# Patient Record
Sex: Female | Born: 1935 | Race: White | Hispanic: No | Smoking: Former smoker
Health system: Southern US, Community
[De-identification: ages and names within clinical notes are randomized; demographics above are authoritative.]

## PROBLEM LIST (undated history)

## (undated) DIAGNOSIS — Z96649 Presence of unspecified artificial hip joint: Secondary | ICD-10-CM

## (undated) DIAGNOSIS — R319 Hematuria, unspecified: Secondary | ICD-10-CM

## (undated) DIAGNOSIS — D649 Anemia, unspecified: Secondary | ICD-10-CM

## (undated) DIAGNOSIS — R739 Hyperglycemia, unspecified: Secondary | ICD-10-CM

## (undated) DIAGNOSIS — M1990 Unspecified osteoarthritis, unspecified site: Secondary | ICD-10-CM

## (undated) DIAGNOSIS — R413 Other amnesia: Secondary | ICD-10-CM

## (undated) DIAGNOSIS — M8580 Other specified disorders of bone density and structure, unspecified site: Secondary | ICD-10-CM

## (undated) DIAGNOSIS — C50919 Malignant neoplasm of unspecified site of unspecified female breast: Secondary | ICD-10-CM

## (undated) DIAGNOSIS — I350 Nonrheumatic aortic (valve) stenosis: Secondary | ICD-10-CM

## (undated) DIAGNOSIS — M545 Low back pain, unspecified: Secondary | ICD-10-CM

## (undated) DIAGNOSIS — G5600 Carpal tunnel syndrome, unspecified upper limb: Secondary | ICD-10-CM

## (undated) DIAGNOSIS — C189 Malignant neoplasm of colon, unspecified: Secondary | ICD-10-CM

## (undated) DIAGNOSIS — M797 Fibromyalgia: Secondary | ICD-10-CM

## (undated) DIAGNOSIS — G8929 Other chronic pain: Secondary | ICD-10-CM

## (undated) DIAGNOSIS — E785 Hyperlipidemia, unspecified: Secondary | ICD-10-CM

## (undated) DIAGNOSIS — I10 Essential (primary) hypertension: Secondary | ICD-10-CM

## (undated) HISTORY — DX: Low back pain: M54.5

## (undated) HISTORY — DX: Anemia, unspecified: D64.9

## (undated) HISTORY — DX: Carpal tunnel syndrome, unspecified upper limb: G56.00

## (undated) HISTORY — DX: Other chronic pain: G89.29

## (undated) HISTORY — DX: Hematuria, unspecified: R31.9

## (undated) HISTORY — DX: Malignant neoplasm of colon, unspecified: C18.9

## (undated) HISTORY — DX: Hyperlipidemia, unspecified: E78.5

## (undated) HISTORY — DX: Malignant neoplasm of unspecified site of unspecified female breast: C50.919

## (undated) HISTORY — DX: Other specified disorders of bone density and structure, unspecified site: M85.80

## (undated) HISTORY — DX: Low back pain, unspecified: M54.50

## (undated) HISTORY — DX: Hyperglycemia, unspecified: R73.9

## (undated) HISTORY — DX: Other amnesia: R41.3

## (undated) HISTORY — DX: Unspecified osteoarthritis, unspecified site: M19.90

## (undated) HISTORY — DX: Nonrheumatic aortic (valve) stenosis: I35.0

## (undated) HISTORY — DX: Presence of unspecified artificial hip joint: Z96.649

## (undated) HISTORY — DX: Fibromyalgia: M79.7

## (undated) HISTORY — DX: Essential (primary) hypertension: I10

---
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
## 1991-11-13 HISTORY — PX: OTHER SURGICAL HISTORY: SHX169

## 2000-11-12 HISTORY — PX: CHOLECYSTECTOMY: SHX55

## 2006-11-12 HISTORY — PX: BREAST LUMPECTOMY: SHX2

## 2013-12-08 LAB — LIPID PANEL
CHOLESTEROL: 183 (ref 0–200)
HDL: 56 (ref 35–70)
LDL Cholesterol: 92
TRIGLYCERIDES: 177 — AB (ref 40–160)

## 2013-12-08 LAB — CBC AND DIFFERENTIAL
HCT: 46 (ref 36–46)
Hemoglobin: 15.2 (ref 12.0–16.0)
PLATELETS: 153 (ref 150–399)
WBC: 6.7

## 2013-12-08 LAB — BASIC METABOLIC PANEL
BUN: 21 (ref 4–21)
CREATININE: 0.9 (ref 0.5–1.1)
Glucose: 135
POTASSIUM: 4.1 (ref 3.4–5.3)
Sodium: 142 (ref 137–147)

## 2013-12-08 LAB — HEPATIC FUNCTION PANEL: ALT: 16 (ref 7–35)

## 2014-09-16 LAB — HM MAMMOGRAPHY

## 2016-11-01 LAB — BASIC METABOLIC PANEL
BUN: 23 — AB (ref 4–21)
Creatinine: 0.8 (ref 0.5–1.1)
Glucose: 115
POTASSIUM: 3.7 (ref 3.4–5.3)
SODIUM: 143 (ref 137–147)

## 2016-11-01 LAB — CBC AND DIFFERENTIAL
HEMATOCRIT: 48 — AB (ref 36–46)
HEMOGLOBIN: 16.1 — AB (ref 12.0–16.0)
Neutrophils Absolute: 5
Platelets: 168 (ref 150–399)
WBC: 7.9

## 2017-07-12 LAB — CBC AND DIFFERENTIAL
HEMATOCRIT: 47 — AB (ref 36–46)
Hemoglobin: 16.2 — AB (ref 12.0–16.0)
NEUTROS ABS: 5
PLATELETS: 145 — AB (ref 150–399)
WBC: 7.2

## 2017-07-12 LAB — LIPID PANEL
Cholesterol: 185 (ref 0–200)
HDL: 53 (ref 35–70)
LDL Cholesterol: 93
Triglycerides: 182 — AB (ref 40–160)

## 2017-07-12 LAB — BASIC METABOLIC PANEL
BUN: 16 (ref 4–21)
Creatinine: 0.8 (ref 0.5–1.1)
GLUCOSE: 133
Potassium: 4 (ref 3.4–5.3)
Sodium: 146 (ref 137–147)

## 2017-07-12 LAB — HEMOGLOBIN A1C: Hemoglobin A1C: 6.5

## 2017-09-25 ENCOUNTER — Encounter: Payer: Self-pay | Admitting: Internal Medicine

## 2017-09-25 ENCOUNTER — Ambulatory Visit (INDEPENDENT_AMBULATORY_CARE_PROVIDER_SITE_OTHER): Payer: Medicare HMO | Admitting: Internal Medicine

## 2017-09-25 VITALS — BP 132/78 | HR 66 | Temp 98.0°F | Resp 12 | Ht <= 58 in | Wt 129.0 lb

## 2017-09-25 DIAGNOSIS — R32 Unspecified urinary incontinence: Secondary | ICD-10-CM | POA: Diagnosis not present

## 2017-09-25 DIAGNOSIS — M79662 Pain in left lower leg: Secondary | ICD-10-CM | POA: Diagnosis not present

## 2017-09-25 DIAGNOSIS — E119 Type 2 diabetes mellitus without complications: Secondary | ICD-10-CM

## 2017-09-25 DIAGNOSIS — I10 Essential (primary) hypertension: Secondary | ICD-10-CM | POA: Diagnosis not present

## 2017-09-25 DIAGNOSIS — M2550 Pain in unspecified joint: Secondary | ICD-10-CM | POA: Diagnosis not present

## 2017-09-25 DIAGNOSIS — I350 Nonrheumatic aortic (valve) stenosis: Secondary | ICD-10-CM

## 2017-09-25 DIAGNOSIS — E782 Mixed hyperlipidemia: Secondary | ICD-10-CM | POA: Diagnosis not present

## 2017-09-25 DIAGNOSIS — Z7189 Other specified counseling: Secondary | ICD-10-CM

## 2017-09-25 MED ORDER — HYDROCHLOROTHIAZIDE 25 MG PO TABS: 12.5000 mg | ORAL_TABLET | Freq: Every day | ORAL | 1 refills | Status: DC

## 2017-09-25 MED ORDER — DICLOFENAC SODIUM 1 % TD GEL: TRANSDERMAL | 1 refills | Status: DC

## 2017-09-25 MED ORDER — ISOSORBIDE MONONITRATE ER 30 MG PO TB24: 30.0000 mg | ORAL_TABLET | Freq: Every day | ORAL | 1 refills | Status: DC

## 2017-09-25 MED ORDER — HYDROCODONE-ACETAMINOPHEN 5-325 MG PO TABS: 1.0000 | ORAL_TABLET | Freq: Four times a day (QID) | ORAL | 0 refills | Status: DC | PRN

## 2017-09-25 MED ORDER — SIMVASTATIN 40 MG PO TABS: 40.0000 mg | ORAL_TABLET | Freq: Every day | ORAL | 1 refills | Status: DC

## 2017-09-25 NOTE — Patient Instructions (Signed)
START NORCO (hydrocodone) as needed for pain but at least 2 times daily. May cut tablet in half  [facility] 25mg  daily for overactive bladder - samples given  STOP VESICARE  Continue other medications as ordered  Will call with Ortho referral and PT appt  Will call with [place] appt for left leg  Fasting labs in 1-2 weeks  Follow up in 3 mos for AWV/CPE.

## 2017-09-25 NOTE — Progress Notes (Signed)
Patient ID: Julie Walter, female   DOB: 09-Aug-1919, 83 y.o.   MRN: [id number]    Location:  PAM Place of Service: OFFICE    Advanced Directive information Does Patient Have a Medical Advance Directive?: Yes, Type of Advance Directive: Healthcare Power of [place];Living will  Chief Complaint  Patient presents with  . Establish Care    New Patient establish care, sore area on left leg since Saturday (no known injury). Patient also c/o bone pain. Patient is relocating from [place], here with daughter Julie Walter  . Medication Refill    All medications will be due for refills in December   . Medication Management    Vesicare is not helping, patient would like to d/c. When patient takes Vesicare daily it interfered with urination(stopped urination all together)  and she changed to every 5 days   . Medication Management    Tramadol not helping with pain     HPI:  83 yo female seen today as a new pt. She relocated from [facility] in September 2018. She lives alone in [place]. Her daughter lives close by. She is c/a sore spot on LLE x 5 days. No f/c, no d/c. She reports hx DVT in 2001 after prolonged driving that was tx.  DM - diet controlled. A1c 6.5%  Hyperlipidemia - stable on statin. LDL 93  Hemoconcentration - stable. H/H 16.2/47. She was told it was due to dehydration  Severe AS - 2D echo EF 60-65% with critical AS. Surgery recommended in Spring 2018 but she declined due to c/a severe joint pain.  Urinary incontinance - she has leakage , urge related . She would like to stop vesicare as it is ineffective and caused inability to urinate and dry mouth. She has never tried [employer].  Chronic left sciatic pain - she tried 6 tx of accupuncture without relief this yr. It resolved after 1 month last year. She has not tried PT (she has b/l THR and had PT then). She reports hx scoliosis.   Chronic joint pain - no relief with tramadol. Pain worse in shoulders and sciatica. She was followed  by Ortho in [facility]. She has tried hydrocodone in the past and found it to be more effective than oxycodone.    Past Medical History:  Diagnosis Date  . Anemia   . Aortic stenosis   . Arthritis   . Breast cancer ([place])   . Carpal tunnel syndrome   . Chronic pain   . Colon cancer ([place])   . Fibromyalgia   . Hematuria   . History of hip replacement    bilateral  . Hyperglycemia   . Hyperlipidemia   . Hypertension   . Lumbago   . Memory loss   . Osteoarthritis   . Osteopenia     Past Surgical History:  Procedure Laterality Date  . ABDOMINAL HYSTERECTOMY  1958  . BREAST LUMPECTOMY  2008  . CHOLECYSTECTOMY  2002  . colon cancer tumor  1993   Dr. Jens Som  . hip replacements     2013 and 2014    Patient Care Team: Gildardo Cranker, DO as PCP - General (Internal Medicine)  Social History   Socioeconomic History  . Marital status: Widowed    Spouse name: Not on file  . Number of children: Not on file  . Years of education: Not on file  . Highest education level: Not on file  Social Needs  . Financial resource strain: Not on file  . Food insecurity - worry:  Not on file  . Food insecurity - inability: Not on file  . Transportation needs - medical: Not on file  . Transportation needs - non-medical: Not on file  Occupational History  . Not on file  Tobacco Use  . Smoking status: Former Smoker    Packs/day: 2.00    Years: 20.00    Pack years: 40.00  . Smokeless tobacco: Never Used  Substance and Sexual Activity  . Alcohol use: No  . Drug use: No  . Sexual activity: Not Currently  Other Topics Concern  . Not on file  Social History Narrative   Social History      Diet? light      Do you drink/eat things with caffeine? Coffee 1/day      Marital status?             widowed                       What year were you married? 1944      Do you live in a house, apartment, assisted living, condo, trailer, etc.? Independent living      Is it one or more stories? 3 stories        How many persons live in your home? 1      Do you have any pets in your home? (please list)  none      Highest level of education completed? High school + secretarial studies post      Current or past profession: Herbalist, Arts administrator      Do you exercise?                 yes                     Type & how often? Walking, back exercises      Advanced Directives      Do you have a living will? yes      Do you have a DNR form?       no                           If not, do you want to discuss one? yes      Do you have signed POA/HPOA for forms? yes      Functional Status      Do you have difficulty bathing or dressing yourself? no      Do you have difficulty preparing food or eating? no      Do you have difficulty managing your medications? no      Do you have difficulty managing your finances? no      Do you have difficulty affording your medications? no     reports that she has quit smoking. She has a 40.00 pack-year smoking history. she has never used smokeless tobacco. She reports that she does not drink alcohol or use drugs.  Family History  Problem Relation Age of Onset  . Cancer Mother   . Heart disease Mother   . Cancer Sister   . Cancer Brother   . Cancer Sister   . Heart failure Brother   . Cancer Sister   . Heart failure Brother   . Hemachromatosis Son   . Diabetes Son    Family Status  Relation Name Status  . Mother Audrea Muscat Deceased  . Father Deidre Ala Deceased  . Sister Barnett Applebaum Deceased  .  Brother Gully Deceased  . Sister Rod Holler Deceased  . Brother [employer] Deceased  . Sister Berenice Primas Deceased  . Brother Rica Mote Deceased  . Son  Lions  . Daughter Benna Dunks    Immunization History  Administered Date(s) Administered  . Influenza,inj,Quad PF,6+ Mos 08/30/2017  . Influenza-Unspecified 08/21/2016, 07/12/2017    No Known Allergies  Medications:   Medication List        Accurate as of 09/25/17 12:01 PM. Always use your  most recent med list.          diclofenac sodium 1 % Gel Commonly known as:  VOLTAREN   hydrochlorothiazide 25 MG tablet Commonly known as:  HYDRODIURIL   isosorbide mononitrate 30 MG 24 hr tablet Commonly known as:  IMDUR   multivitamin with minerals Tabs tablet   naproxen sodium 220 MG tablet Commonly known as:  ALEVE   polyethylene glycol packet Commonly known as:  MIRALAX / GLYCOLAX   PROBIOTIC ACIDOPHILUS PO   simvastatin 40 MG tablet Commonly known as:  ZOCOR   solifenacin 5 MG tablet Commonly known as:  VESICARE   traMADol 50 MG tablet Commonly known as:  ULTRAM       Review of Systems  Constitutional: Positive for fatigue.  HENT: Positive for hearing loss (wears hearing aids).   Respiratory: Positive for shortness of breath (with stair climbing).   Cardiovascular: Positive for leg swelling. Negative for chest pain and palpitations.  Genitourinary: Positive for difficulty urinating.  Musculoskeletal: Positive for arthralgias, back pain, gait problem and joint swelling.  All other systems reviewed and are negative.   Vitals:   09/25/17 1128  BP: 132/78  Pulse: 66  Resp: 12  Temp: 98 F (36.7 C)  TempSrc: Oral  SpO2: 94%  Weight: 129 lb (58.5 kg)  Height: 4' 9.5" (1.461 m)   Body mass index is 27.43 kg/m.  Physical Exam  Constitutional: She is oriented to person, place, and time. She appears well-developed and well-nourished.  HOH  HENT:  Mouth/Throat: Oropharynx is clear and moist. No oropharyngeal exudate.  MMM; no oral thrush  Eyes: Pupils are equal, round, and reactive to light. No scleral icterus.  Neck: Neck supple. Carotid bruit is present (b/l systolic). No tracheal deviation present. No thyromegaly present.  Cardiovascular: Normal rate, regular rhythm and intact distal pulses. Exam reveals no gallop and no friction rub.  Murmur (2/6 SEM --> carotid b/l) heard. (+) left calf palpable cord, TTP with swelling. No LE edema b/l. No right  calf TTP. B/l LE varicose veins, soft  Pulmonary/Chest: Effort normal and breath sounds normal. No stridor. No respiratory distress. She has no wheezes. She has no rales.  Abdominal: Soft. Normal appearance and bowel sounds are normal. She exhibits no distension and no mass. There is no hepatomegaly. There is no tenderness. There is no rigidity, no rebound and no guarding. No hernia.  Musculoskeletal: She exhibits edema and tenderness.  L>R shoulder with reduced ROM and TTP anterior left shoulder and crepitus; AC joint TTP L>R with swelling; L>R coracoid process TTP  Lymphadenopathy:    She has no cervical adenopathy.  Neurological: She is alert and oriented to person, place, and time. She has normal reflexes.  Skin: Skin is warm and dry. No rash noted.  Psychiatric: She has a normal mood and affect. Her behavior is normal. Judgment and thought content normal.     Labs reviewed: Abstract on 09/17/2017  Component Date Value Ref Range Status  . Hemoglobin 12/08/2013 15.2  12.0 -  16.0 Final  . HCT 12/08/2013 46  36 - 46 Final  . Platelets 12/08/2013 153  150 - 399 Final  . WBC 12/08/2013 6.7   Final  . Glucose 12/08/2013 135   Final  . BUN 12/08/2013 21  4 - 21 Final  . Creatinine 12/08/2013 0.9  0.5 - 1.1 Final  . Potassium 12/08/2013 4.1  3.4 - 5.3 Final  . Sodium 12/08/2013 142  137 - 147 Final  . Triglycerides 12/08/2013 177* 40 - 160 Final  . Cholesterol 12/08/2013 183  0 - 200 Final  . HDL 12/08/2013 56  35 - 70 Final  . LDL Cholesterol 12/08/2013 92   Final  . ALT 12/08/2013 16  7 - 35 Final  . Hemoglobin 07/12/2017 16.2* 12.0 - 16.0 Final  . HCT 07/12/2017 47* 36 - 46 Final  . Neutrophils Absolute 07/12/2017 5   Final  . Platelets 07/12/2017 145* 150 - 399 Final  . WBC 07/12/2017 7.2   Final  . Glucose 07/12/2017 133   Final  . BUN 07/12/2017 16  4 - 21 Final  . Creatinine 07/12/2017 0.8  0.5 - 1.1 Final  . Potassium 07/12/2017 4.0  3.4 - 5.3 Final  . Sodium 07/12/2017 146   137 - 147 Final  . Triglycerides 07/12/2017 182* 40 - 160 Final  . Cholesterol 07/12/2017 185  0 - 200 Final  . HDL 07/12/2017 53  35 - 70 Final  . LDL Cholesterol 07/12/2017 93   Final  . Hemoglobin A1C 07/12/2017 6.5   Final  . Hemoglobin 11/01/2016 16.1* 12.0 - 16.0 Final  . HCT 11/01/2016 48* 36 - 46 Final  . Neutrophils Absolute 11/01/2016 5   Final  . Platelets 11/01/2016 168  150 - 399 Final  . WBC 11/01/2016 7.9   Final  . Glucose 11/01/2016 115   Final  . BUN 11/01/2016 23* 4 - 21 Final  . Creatinine 11/01/2016 0.8  0.5 - 1.1 Final  . Potassium 11/01/2016 3.7  3.4 - 5.3 Final  . Sodium 11/01/2016 143  137 - 147 Final  . HM Mammogram 09/16/2014 0-4 Bi-Rad  0-4 Bi-Rad, Self Reported Normal Final   [facility] radiology    No results found.   Assessment/Plan   ICD-10-CM   1. Pain of left calf M79.662 VAS [place] LOWER EXTREMITY VENOUS (DVT)  2. Multiple joint pain M25.50 diclofenac sodium (VOLTAREN) 1 % GEL    Ambulatory referral to Physical Therapy    Ambulatory referral to Orthopedic Surgery    HYDROcodone-acetaminophen (NORCO) 5-325 MG tablet  3. Essential hypertension I10 hydrochlorothiazide (HYDRODIURIL) 25 MG tablet  4. Type 2 diabetes mellitus without complication, without long-term current use of insulin (HCC) E11.9   5. Aortic stenosis, severe I35.0 isosorbide mononitrate (IMDUR) 30 MG 24 hr tablet   critical  6. Urinary incontinence, unspecified type R32   7. Mixed hyperlipidemia E78.2 simvastatin (ZOCOR) 40 MG tablet  8. Advanced directives, counseling/discussion Z71.89    START NORCO (hydrocodone) as needed for pain but at least 2 times daily. May cut tablet in half - if she tolerates, she will need pain contract  MYRBETRIQ 25mg  daily for overactive bladder - samples given  STOP VESICARE  Continue other medications as ordered  Will call with Ortho referral and PT appt  Will call with [place] appt for left leg  Fasting labs in 1-2 weeks  Follow up in 3  mos for AWV/CPE.   Devora Tortorella S. Eulas Post, D. O., F. [place]  Meda Coffee  [facility] and Adult Medicine [place], [place] [address] [phone] Cell (Monday-Friday 8 AM - 5 PM) [phone] After 5 PM and follow prompts

## 2017-09-26 ENCOUNTER — Other Ambulatory Visit: Payer: Self-pay | Admitting: Internal Medicine

## 2017-09-26 DIAGNOSIS — I10 Essential (primary) hypertension: Secondary | ICD-10-CM

## 2017-09-26 DIAGNOSIS — E782 Mixed hyperlipidemia: Secondary | ICD-10-CM

## 2017-09-26 DIAGNOSIS — E119 Type 2 diabetes mellitus without complications: Secondary | ICD-10-CM

## 2017-09-26 DIAGNOSIS — Z79899 Other long term (current) drug therapy: Secondary | ICD-10-CM

## 2017-10-01 ENCOUNTER — Telehealth: Payer: Self-pay

## 2017-10-01 ENCOUNTER — Ambulatory Visit (HOSPITAL_COMMUNITY)
Admission: RE | Admit: 2017-10-01 | Discharge: 2017-10-01 | Disposition: A | Discharge status: Home / Self Care | Payer: Medicare HMO | Source: Ambulatory Visit | Attending: Vascular Surgery | Admitting: Vascular Surgery

## 2017-10-01 DIAGNOSIS — M79662 Pain in left lower leg: Secondary | ICD-10-CM

## 2017-10-01 NOTE — Telephone Encounter (Signed)
A call came in from [facility] stating that patient is negative for DVT of left leg.

## 2017-10-07 ENCOUNTER — Telehealth: Payer: Self-pay | Admitting: *Deleted

## 2017-10-07 NOTE — Telephone Encounter (Signed)
LMOM to return call.

## 2017-10-07 NOTE — Telephone Encounter (Signed)
Ok to [employer] med - how often is pt taking med? Is it controlling her pain?

## 2017-10-07 NOTE — Telephone Encounter (Signed)
Patient daughter, Anne Ng called and stated that patient needs a refill on her Pain Medication, Hydrocodone, tolerated.  Verified [facility] [place] and last RF was 09/25/2017 for #30.  Is it ok for a refill, please advise.

## 2017-10-08 ENCOUNTER — Encounter: Payer: Self-pay | Admitting: Internal Medicine

## 2017-10-08 ENCOUNTER — Ambulatory Visit (INDEPENDENT_AMBULATORY_CARE_PROVIDER_SITE_OTHER): Payer: Medicare HMO | Admitting: Internal Medicine

## 2017-10-08 ENCOUNTER — Other Ambulatory Visit: Payer: Medicare HMO

## 2017-10-08 VITALS — BP 138/80 | HR 69 | Temp 97.4°F | Ht <= 58 in | Wt 130.0 lb

## 2017-10-08 DIAGNOSIS — M4726 Other spondylosis with radiculopathy, lumbar region: Secondary | ICD-10-CM | POA: Diagnosis not present

## 2017-10-08 DIAGNOSIS — E782 Mixed hyperlipidemia: Secondary | ICD-10-CM | POA: Diagnosis not present

## 2017-10-08 DIAGNOSIS — M25473 Effusion, unspecified ankle: Secondary | ICD-10-CM

## 2017-10-08 DIAGNOSIS — Z79899 Other long term (current) drug therapy: Secondary | ICD-10-CM

## 2017-10-08 DIAGNOSIS — E119 Type 2 diabetes mellitus without complications: Secondary | ICD-10-CM

## 2017-10-08 MED ORDER — OXYCODONE HCL 5 MG PO TABS: 5.0000 mg | ORAL_TABLET | ORAL | 0 refills | Status: DC | PRN

## 2017-10-08 NOTE — Progress Notes (Signed)
Patient ID: Julie Walter, female   DOB: 15-Jan-1919, 83 y.o.   MRN: [id number]    Location:  PAM Place of Service: OFFICE  Chief Complaint  Patient presents with  . Acute Visit    Ankle swelling, here with Daughter   . Health Maintenance    Foot Exam Due, MALB, DM eye exam and Dexa overdue   . Medication Management    Hydrocodone is not helping, ? if patient should start PT tomorrow   . Medication Refill    No refills needed     HPI:  83 yo female seen today for ankle swelling and joint pain. She has severe pain in back of her legs and when she sits down. Sleep interrupted due to pain. Tried Norco but ineffective, despite escalating frequency up to every 6 hrs. She uses voltaren gel 2-3 times per day. She noticed swelling in her ankles since beginning Norco. She has never had MRI. Xray L spine in July revealed scoliosis and severe degenerative changes. No loss of bowel/bladder control. She has not tried Myrbetriq yet.  Her daughter is present  Past Medical History:  Diagnosis Date  . Anemia   . Aortic stenosis   . Arthritis   . Breast cancer ([place])   . Carpal tunnel syndrome   . Chronic pain   . Colon cancer ([place])   . Fibromyalgia   . Hematuria   . History of hip replacement    bilateral  . Hyperglycemia   . Hyperlipidemia   . Hypertension   . Lumbago   . Memory loss   . Osteoarthritis   . Osteopenia     Past Surgical History:  Procedure Laterality Date  . ABDOMINAL HYSTERECTOMY  1958  . BREAST LUMPECTOMY  2008  . CHOLECYSTECTOMY  2002  . colon cancer tumor  1993   Dr. Jens Som  . hip replacements     2013 and 2014    Patient Care Team: Gildardo Cranker, DO as PCP - General (Internal Medicine)  Social History   Socioeconomic History  . Marital status: Widowed    Spouse name: Not on file  . Number of children: Not on file  . Years of education: Not on file  . Highest education level: Not on file  Social Needs  . Financial resource strain: Not on file    . Food insecurity - worry: Not on file  . Food insecurity - inability: Not on file  . Transportation needs - medical: Not on file  . Transportation needs - non-medical: Not on file  Occupational History  . Not on file  Tobacco Use  . Smoking status: Former Smoker    Packs/day: 2.00    Years: 20.00    Pack years: 40.00  . Smokeless tobacco: Never Used  Substance and Sexual Activity  . Alcohol use: No  . Drug use: No  . Sexual activity: Not Currently  Other Topics Concern  . Not on file  Social History Narrative   Social History      Diet? light      Do you drink/eat things with caffeine? Coffee 1/day      Marital status?             widowed                       What year were you married? 1944      Do you live in a house, apartment, assisted living, condo, trailer, etc.? Independent living  Is it one or more stories? 3 stories      How many persons live in your home? 1      Do you have any pets in your home? (please list)  none      Highest level of education completed? High school + secretarial studies post      Current or past profession: Herbalist, Arts administrator      Do you exercise?                 yes                     Type & how often? Walking, back exercises      Advanced Directives      Do you have a living will? yes      Do you have a DNR form?       no                           If not, do you want to discuss one? yes      Do you have signed POA/HPOA for forms? yes      Functional Status      Do you have difficulty bathing or dressing yourself? no      Do you have difficulty preparing food or eating? no      Do you have difficulty managing your medications? no      Do you have difficulty managing your finances? no      Do you have difficulty affording your medications? no     reports that she has quit smoking. She has a 40.00 pack-year smoking history. she has never used smokeless tobacco. She reports that she does not drink  alcohol or use drugs.  Family History  Problem Relation Age of Onset  . Cancer Mother   . Heart disease Mother   . Cancer Sister   . Cancer Brother   . Cancer Sister   . Heart failure Brother   . Cancer Sister   . Heart failure Brother   . Hemachromatosis Son   . Diabetes Son    Family Status  Relation Name Status  . Mother Audrea Muscat Deceased  . Father Deidre Ala Deceased  . Sister Barnett Applebaum Deceased  . Brother Gregory Deceased  . Sister Rod Holler Deceased  . Brother [employer] Deceased  . Sister Berenice Primas Deceased  . Brother Rica Mote Deceased  . Son Cressona Lions  . Daughter Benna Dunks     No Known Allergies  Medications:   Medication List        Accurate as of 10/08/17  1:58 PM. Always use your most recent med list.          diclofenac sodium 1 % Gel Commonly known as:  VOLTAREN Apply topically 2 (two) times daily.   hydrochlorothiazide 25 MG tablet Commonly known as:  HYDRODIURIL Take 0.5 tablets (12.5 mg total) daily by mouth.   HYDROcodone-acetaminophen 5-325 MG tablet Commonly known as:  NORCO Take 1 tablet every 6 (six) hours as needed by mouth for moderate pain.   isosorbide mononitrate 30 MG 24 hr tablet Commonly known as:  IMDUR Take 1 tablet (30 mg total) daily by mouth.   multivitamin with minerals Tabs tablet   polyethylene glycol packet Commonly known as:  MIRALAX / GLYCOLAX   PROBIOTIC ACIDOPHILUS PO   simvastatin 40 MG tablet Commonly known as:  ZOCOR Take 1 tablet (40  mg total) daily by mouth.       Review of Systems  Genitourinary: Positive for difficulty urinating and frequency.  Musculoskeletal: Positive for arthralgias, back pain and gait problem.  All other systems reviewed and are negative.   Vitals:   10/08/17 1341  BP: 138/80  Pulse: 69  Temp: (!) 97.4 F (36.3 C)  TempSrc: Oral  SpO2: 93%  Weight: 130 lb (59 kg)  Height: 4' 9.5" (1.461 m)   Body mass index is 27.64 kg/m.  Physical Exam  Constitutional: She is oriented to  person, place, and time. She appears well-developed and well-nourished.  Musculoskeletal: She exhibits edema, tenderness and deformity (scoliosis).       Lumbar back: She exhibits decreased range of motion, tenderness and spasm.       Back:  (+) left standing flexion test  Neurological: She is alert and oriented to person, place, and time. Gait (antalgic) abnormal.  Skin: Skin is warm and dry. No rash noted.  Psychiatric: She has a normal mood and affect. Her behavior is normal. Judgment and thought content normal.     Labs reviewed: Abstract on 09/17/2017  Component Date Value Ref Range Status  . Hemoglobin 12/08/2013 15.2  12.0 - 16.0 Final  . HCT 12/08/2013 46  36 - 46 Final  . Platelets 12/08/2013 153  150 - 399 Final  . WBC 12/08/2013 6.7   Final  . Glucose 12/08/2013 135   Final  . BUN 12/08/2013 21  4 - 21 Final  . Creatinine 12/08/2013 0.9  0.5 - 1.1 Final  . Potassium 12/08/2013 4.1  3.4 - 5.3 Final  . Sodium 12/08/2013 142  137 - 147 Final  . Triglycerides 12/08/2013 177* 40 - 160 Final  . Cholesterol 12/08/2013 183  0 - 200 Final  . HDL 12/08/2013 56  35 - 70 Final  . LDL Cholesterol 12/08/2013 92   Final  . ALT 12/08/2013 16  7 - 35 Final  . Hemoglobin 07/12/2017 16.2* 12.0 - 16.0 Final  . HCT 07/12/2017 47* 36 - 46 Final  . Neutrophils Absolute 07/12/2017 5   Final  . Platelets 07/12/2017 145* 150 - 399 Final  . WBC 07/12/2017 7.2   Final  . Glucose 07/12/2017 133   Final  . BUN 07/12/2017 16  4 - 21 Final  . Creatinine 07/12/2017 0.8  0.5 - 1.1 Final  . Potassium 07/12/2017 4.0  3.4 - 5.3 Final  . Sodium 07/12/2017 146  137 - 147 Final  . Triglycerides 07/12/2017 182* 40 - 160 Final  . Cholesterol 07/12/2017 185  0 - 200 Final  . HDL 07/12/2017 53  35 - 70 Final  . LDL Cholesterol 07/12/2017 93   Final  . Hemoglobin A1C 07/12/2017 6.5   Final  . Hemoglobin 11/01/2016 16.1* 12.0 - 16.0 Final  . HCT 11/01/2016 48* 36 - 46 Final  . Neutrophils Absolute  11/01/2016 5   Final  . Platelets 11/01/2016 168  150 - 399 Final  . WBC 11/01/2016 7.9   Final  . Glucose 11/01/2016 115   Final  . BUN 11/01/2016 23* 4 - 21 Final  . Creatinine 11/01/2016 0.8  0.5 - 1.1 Final  . Potassium 11/01/2016 3.7  3.4 - 5.3 Final  . Sodium 11/01/2016 143  137 - 147 Final  . HM Mammogram 09/16/2014 0-4 Bi-Rad  0-4 Bi-Rad, Self Reported Normal Final   [facility] radiology    No results found.   Assessment/Plan   ICD-10-CM  1. Other spondylosis with radiculopathy, lumbar region - pain failing to change as expected M47.26 oxyCODONE (OXY IR/ROXICODONE) 5 MG immediate release tablet    MR Lumbar Spine Wo Contrast  2. Ankle swelling, unspecified laterality M25.473     May need Ortho vs neurosx eval for probable epidural injection  Start PT as scheduled in the AM  START OXYIR 5MG  TAKE 1 TAB EVERY 4 HRS AS NEEDED FOR MODERATE-SEVERE PAIN  - side effects discussed  STOP HYDROCODONE  Continue other medications as ordered  Will call with MRI appt  Follow up as scheduled  Stacy Deshler S. Perlie Gold  [facility] and Adult Medicine [address] [place], [place] [address] [phone] Cell (Monday-Friday 8 AM - 5 PM) [phone] After 5 PM and follow prompts

## 2017-10-08 NOTE — Telephone Encounter (Signed)
Daughter stated that patient states that sometimes the Hydrocodone controls her pain and sometimes not. Also stated that patient is having some swelling in extremities. Daughter has scheduled an appointment with Dr. Eulas Post for this afternoon to have this looked at and will wait on Rx until then, just in case Dr. Eulas Post changes Rx. Agreed.

## 2017-10-08 NOTE — Patient Instructions (Addendum)
Start PT as scheduled  START OXYIR 5MG  TAKE 1 TAB EVERY 4 HRS AS NEEDED FOR MODERATE-SEVERE PAIN  STOP HYDROCODONE  Continue other medications as ordered  Will call with MRI appt  Follow up as scheduled

## 2017-10-09 ENCOUNTER — Ambulatory Visit: Payer: Medicare HMO | Attending: Internal Medicine | Admitting: Physical Therapy

## 2017-10-09 ENCOUNTER — Encounter: Payer: Self-pay | Admitting: Physical Therapy

## 2017-10-09 DIAGNOSIS — M545 Low back pain: Secondary | ICD-10-CM | POA: Diagnosis not present

## 2017-10-09 DIAGNOSIS — M25552 Pain in left hip: Secondary | ICD-10-CM | POA: Diagnosis present

## 2017-10-09 DIAGNOSIS — R2689 Other abnormalities of gait and mobility: Secondary | ICD-10-CM | POA: Diagnosis present

## 2017-10-09 DIAGNOSIS — M25551 Pain in right hip: Secondary | ICD-10-CM

## 2017-10-09 DIAGNOSIS — G8929 Other chronic pain: Secondary | ICD-10-CM | POA: Diagnosis present

## 2017-10-09 DIAGNOSIS — M62830 Muscle spasm of back: Secondary | ICD-10-CM | POA: Diagnosis present

## 2017-10-09 LAB — URINALYSIS, ROUTINE W REFLEX MICROSCOPIC
Bilirubin Urine: NEGATIVE
Glucose, UA: NEGATIVE
HYALINE CAST: NONE SEEN /LPF
Hgb urine dipstick: NEGATIVE
KETONES UR: NEGATIVE
Nitrite: NEGATIVE
PH: 6.5 (ref 5.0–8.0)
Protein, ur: NEGATIVE
RBC / HPF: NONE SEEN /HPF (ref 0–2)
SPECIFIC GRAVITY, URINE: 1.018 (ref 1.001–1.03)

## 2017-10-09 LAB — COMPLETE METABOLIC PANEL WITH GFR
AG Ratio: 1.9 (calc) (ref 1.0–2.5)
ALBUMIN MSPROF: 4.2 g/dL (ref 3.6–5.1)
ALT: 9 U/L (ref 6–29)
AST: 18 U/L (ref 10–35)
Alkaline phosphatase (APISO): 50 U/L (ref 33–130)
BUN/Creatinine Ratio: 21 (calc) (ref 6–22)
BUN: 20 mg/dL (ref 7–25)
CHLORIDE: 101 mmol/L (ref 98–110)
CO2: 32 mmol/L (ref 20–32)
Calcium: 9.8 mg/dL (ref 8.6–10.4)
Creat: 0.94 mg/dL — ABNORMAL HIGH (ref 0.60–0.88)
GFR, Est African American: 59 mL/min/{1.73_m2} — ABNORMAL LOW (ref >=60)
GFR, Est Non African American: 51 mL/min/{1.73_m2} — ABNORMAL LOW (ref >=60)
GLUCOSE: 128 mg/dL — AB (ref 65–99)
Globulin: 2.2 g/dL (calc) (ref 1.9–3.7)
POTASSIUM: 3.9 mmol/L (ref 3.5–5.3)
Sodium: 142 mmol/L (ref 135–146)
TOTAL PROTEIN: 6.4 g/dL (ref 6.1–8.1)
Total Bilirubin: 1 mg/dL (ref 0.2–1.2)

## 2017-10-09 LAB — CBC WITH DIFFERENTIAL/PLATELET
Basophils Absolute: 20 cells/uL (ref 0–200)
Basophils Relative: 0.3 %
EOS ABS: 91 {cells}/uL (ref 15–500)
Eosinophils Relative: 1.4 %
HCT: 44.4 % (ref 35.0–45.0)
Hemoglobin: 15 g/dL (ref 11.7–15.5)
Lymphs Abs: 1378 cells/uL (ref 850–3900)
MCH: 31.6 pg (ref 27.0–33.0)
MCHC: 33.8 g/dL (ref 32.0–36.0)
MCV: 93.7 fL (ref 80.0–100.0)
MONOS PCT: 10.9 %
MPV: 11.6 fL (ref 7.5–12.5)
NEUTROS PCT: 66.2 %
Neutro Abs: 4303 cells/uL (ref 1500–7800)
PLATELETS: 135 10*3/uL — AB (ref 140–400)
RBC: 4.74 10*6/uL (ref 3.80–5.10)
RDW: 11.8 % (ref 11.0–15.0)
Total Lymphocyte: 21.2 %
WBC: 6.5 10*3/uL (ref 3.8–10.8)
WBCMIX: 709 {cells}/uL (ref 200–950)

## 2017-10-09 LAB — HEMOGLOBIN A1C
EAG (MMOL/L): 7.3 (calc)
HEMOGLOBIN A1C: 6.2 %{Hb} — AB (ref <5.7)
MEAN PLASMA GLUCOSE: 131 (calc)

## 2017-10-09 LAB — LIPID PANEL
CHOLESTEROL: 163 mg/dL (ref <200)
HDL: 55 mg/dL (ref >50)
LDL Cholesterol (Calc): 84 mg/dL (calc)
Non-HDL Cholesterol (Calc): 108 mg/dL (calc) (ref <130)
Total CHOL/HDL Ratio: 3 (calc) (ref <5.0)
Triglycerides: 141 mg/dL (ref <150)

## 2017-10-09 LAB — MICROALBUMIN / CREATININE URINE RATIO
CREATININE, URINE: 92 mg/dL (ref 20–275)
MICROALB UR: 1.1 mg/dL
MICROALB/CREAT RATIO: 12 ug/mg{creat} (ref <30)

## 2017-10-09 NOTE — Therapy (Signed)
[place], [place], [address] Phone: [phone]   Fax:  [phone]  Physical Therapy Evaluation  Patient Details  Name: Julie Walter MRN: [id number] Date of Birth: Jul 27, 1919 Referring Provider: Gildardo Cranker, DO   Encounter Date: 10/09/2017  PT End of Session - 10/09/17 1629    Visit Number  1    Number of Visits  12    Date for PT Re-Evaluation  10/30/17    Authorization Type  MCR: Kx mod by 15th visit, progress note by 10th visit.     PT Start Time  1415    PT Stop Time  1505    PT Time Calculation (min)  50 min    Activity Tolerance  Patient tolerated treatment well    Behavior During Therapy  WFL for tasks assessed/performed       Past Medical History:  Diagnosis Date  . Anemia   . Aortic stenosis   . Arthritis   . Breast cancer ([place])   . Carpal tunnel syndrome   . Chronic pain   . Colon cancer ([place])   . Fibromyalgia   . Hematuria   . History of hip replacement    bilateral  . Hyperglycemia   . Hyperlipidemia   . Hypertension   . Lumbago   . Memory loss   . Osteoarthritis   . Osteopenia     Past Surgical History:  Procedure Laterality Date  . ABDOMINAL HYSTERECTOMY  1958  . BREAST LUMPECTOMY  2008  . CHOLECYSTECTOMY  2002  . colon cancer tumor  1993   Dr. Jens Som  . hip replacements     2013 and 2014    There were no vitals filed for this visit.   Subjective Assessment - 10/09/17 1424    Subjective   Pt. reports pain from sciatica that began in June and has progressively gotten worse. Pt. reports that pain may have been caused by prolonged sitting from extended car ride and that pain radiated down the entire LE bilaterally. She notes that the pain began on the left but is now worse on the right. Pt. recalls having previous episodes of similar pain that normally resolve, however states not being able to find relief from current episode. Pt. states she is an avid walker  and has not been able to walk as much since onset of pain. Pt. notes tingling however does not display any red flag symptoms.     Pertinent History  bilateral hip replacements, lt. 2013, rt. 2014     How long can you sit comfortably?  pt. could not specify, states she does not like to sit for long periods of time     How long can you stand comfortably?  10 minutes     How long can you walk comfortably?  10 minutes     Diagnostic tests  upcoming MRI    Patient Stated Goals  walking better, pain relief     Currently in Pain?  No/denies    Pain Score  -- at worst 10/10    Pain Location  Back    Pain Descriptors / Indicators  Spasm    Pain Type  Chronic pain    Pain Radiating Towards  entire LE, bilaterally     Pain Onset  More than a month ago    Pain Frequency  Intermittent    Aggravating Factors   laying, prolonged sitting, prolonged standing     Pain Relieving Factors  medication,  rest, heat, ice        [facility] PT Assessment - 10/09/17 1434      Assessment   Medical Diagnosis  Multiple joint pain     Referring Provider  Gildardo Cranker, DO    Onset Date/Surgical Date  -- June 2018    Hand Dominance  Right    Next MD Visit  12/2017 saw physcian yesterday    Prior Therapy  Yes  for hip/ back       Precautions   Precautions  Other (comment) pt. is hard of hearing       Restrictions   Weight Bearing Restrictions  No      Balance Screen   Has the patient fallen in the past 6 months  Yes    How many times?  1    Has the patient had a decrease in activity level because of a fear of falling?   No    Is the patient reluctant to leave their home because of a fear of falling?   No      Home Social worker  Private residence    Living Arrangements  Alone    Available Help at Discharge  Family    Type of [place]  One level    [place] - single point;Walker - 4 wheels      Prior Function   Level of  Independence  Independent    Leisure  walking      Cognition   Overall Cognitive Status  Within Functional Limits for tasks assessed      Observation/Other Assessments   Focus on Therapeutic Outcomes (FOTO)   58% limited  predicted to be 50% limited       Posture/Postural Control   Posture/Postural Control  Postural limitations    Postural Limitations  Rounded Shoulders;Forward head;Increased thoracic kyphosis;Increased lumbar lordosis;Flexed trunk      ROM / Strength   AROM / PROM / Strength  AROM;PROM;Strength      AROM   AROM Assessment Site  Lumbar    Lumbar Flexion  68    Lumbar Extension  20    Lumbar - Right Side Bend  13 with pain    Lumbar - Left Side Bend  18      Strength   Strength Assessment Site  Hip    Right/Left Hip  Right;Left    Right Hip Flexion  3+/5 pain getting in position    Right Hip External Rotation   3-/5 pain getting into position    Right Hip Internal Rotation  3+/5 pain getting into position    Right Hip ABduction  3+/5    Right Hip ADduction  4-/5    Left Hip Flexion  4-/5    Left Hip External Rotation  3/5 pain getting into position    Left Hip Internal Rotation  3/5 pain getting into position    Left Hip ABduction  4-/5    Left Hip ADduction  4-/5      Palpation   Spinal mobility  hypomobile L3-L5, pain centrally, rt unilateral L3-L5 assesed in sidelying    Palpation comment  TTP over R glute medius, R piriformis, was able to reproduce pt. symptoms with palpation       Ambulation/Gait   Gait Pattern  Step-to pattern;Decreased step length - right;Decreased step length - left;Right foot flat decreased heel strike on rt  Objective measurements completed on examination: See above findings.      PT Education - 10/09/17 1623    Education provided  Yes    Education Details  pt. educated on anatomy of sciatic nerve, POC, HEP and rationale     Person(s) Educated  Patient;Child(ren) pt. came with daughter     Methods   Explanation;Handout    Comprehension  Verbalized understanding;Returned demonstration       PT Short Term Goals - 10/09/17 1655      PT SHORT TERM GOAL #1   Title  Pt. will be I with initial HEP.     Time  3    Period  Weeks    Status  New    Target Date  10/30/17      PT SHORT TERM GOAL #2   Title  Pt. will demonstrate proper sit-to-stand body mechanics to decrease stress on lumbar spine and LE for pain relief.     Time  3    Period  Weeks    Status  New    Target Date  10/30/17      PT SHORT TERM GOAL #3   Title  Reduce N/T in RLE for >/= 3 days to demonstrate improvement and effectiveness of therapuetic intervention.    Time  3    Period  Weeks    Status  New    Target Date  10/30/17       PT Long Term Goals - 10/09/17 1656      PT LONG TERM GOAL #1   Title  Pt. will increase bilateral overall hip strength >/= 4/5 for LE stability to promote functional gait pattern.     Time  6    Period  Weeks    Status  New    Target Date  11/20/17      PT LONG TERM GOAL #2   Title  Pt. will demonstrate proper reciprocal gait pattern with </= 2/10 pain for increased ability for community ambulation.    Time  6    Period  Weeks    Status  New    Target Date  11/20/17      PT LONG TERM GOAL #3   Title  Pt. will increase standing/walking tolerance >/= 30 minutes to resume recreational walking for achievement of pt. goal.     Time  6    Period  Weeks    Status  New    Target Date  11/20/17      PT LONG TERM GOAL #4   Title  Pt. will increase FOTO score and decrease limitations to </= 50% predicted score.     Time  6    Period  Weeks    Status  New    Target Date  11/20/17        Plan - 10/09/17 1632    Clinical Impression Statement  Pt. is a 83 yo female referred to OPPT for multi-joint pain, most prominently in the low back. Pt. displays gait abnormalities and postural deviations during functional transfers (sit-to-stand). Pt. also displays limited ROM and strength  deficits with pain and difficulty assuming certain testing positions. Pt. will benefit from physical therapy to address deficits in strength and ROM and for functional mobility training to decrease pain and promote ability to maintain independence.     History and Personal Factors relevant to plan of care:  previous hx of back pain, bilateral hip replacements, age    Clinical Presentation  Evolving  Clinical Presentation due to:  pain, balance deficits, abnormal gait     Clinical Decision Making  Moderate    Rehab Potential  Good    PT Frequency  2x / week    PT Duration  6 weeks    PT Treatment/Interventions  ADLs/Self Care Home Management;Cryotherapy;Electrical Stimulation;Moist Heat;Traction;Therapeutic exercise;Therapeutic activities;Functional mobility training;Gait training;Balance training;Patient/family education;Manual techniques    PT Next Visit Plan  assess HEP, Berg Balance assessment, hip/lumbar stretching, core stabiliztion, sit-to-stand mechanics, gait training, balance training     PT Home Exercise Plan  sidelying clamshell, tennis ball self trigger point release, seated piriformis stretch        Patient will benefit from skilled therapeutic intervention in order to improve the following deficits and impairments:  Abnormal gait, Decreased activity tolerance, Improper body mechanics, Increased muscle spasms, Difficulty walking, Decreased strength, Pain  Visit Diagnosis: Chronic bilateral low back pain, with sciatica presence unspecified  Muscle spasm of back  Other abnormalities of gait and mobility  Pain in left hip  Pain in right hip  G-Codes - 10/10/17 1910    Functional Assessment Tool Used (Outpatient Only)  ROM, MMT, Pain, FOTO/clinical judgement    Functional Limitation  Mobility: Walking and moving around    Mobility: Walking and Moving Around Current Status [phone])  At least 40 percent but less than 60 percent impaired, limited or restricted    Mobility:  Walking and Moving Around Goal Status [phone])  At least 20 percent but less than 40 percent impaired, limited or restricted        Problem List There are no active problems to display for this patient.   Eulis Manly, SPT 10/10/17 8:15 AM   [facility] [address] [place], [place], [address] Phone: [phone]   Fax:  [phone]  Name: Taeler Winning MRN: [id number] Date of Birth: December 21, 1918

## 2017-10-15 ENCOUNTER — Telehealth: Payer: Self-pay | Admitting: *Deleted

## 2017-10-15 DIAGNOSIS — M4726 Other spondylosis with radiculopathy, lumbar region: Secondary | ICD-10-CM

## 2017-10-15 NOTE — Telephone Encounter (Signed)
1. Patient daughter, Anne Ng called and stated that the Hydrocodone seems to control patient's pain well. Stated that she takes it 3 times daily with an additional one for occasional breakthrough pain. Would like another Rx for this.   2. Daughter also has concerns with patient having some constipation. Stated that she is taking Miralax daily but wonders if adding a stool softner would help. Please Advise.

## 2017-10-15 NOTE — Telephone Encounter (Signed)
Noted - she needs #90 tabs of OxyIr with no RF; pain contract  Ok to start colace 100mg  daily for constipation

## 2017-10-16 ENCOUNTER — Telehealth: Payer: Self-pay

## 2017-10-16 MED ORDER — OXYCODONE HCL 5 MG PO TABS: 5.0000 mg | ORAL_TABLET | ORAL | 0 refills | Status: DC | PRN

## 2017-10-16 NOTE — Telephone Encounter (Signed)
MRI ordered on 10/08/17 is pending denial and requires peer-to-peer, please call [phone] option 4 then option 2 to schedule appointment  It is recommended that a staff member calls to schedule appointment time, case number [id number]  Dr.Carter please advise when we can schedule appointment for you to complete a peer-to-peer

## 2017-10-16 NOTE — Telephone Encounter (Signed)
Patient daughter notified and agreed. Rx printed, Narcotic Contract printed and medication list updated.  [place] Database Verified.

## 2017-10-17 NOTE — Telephone Encounter (Signed)
Called and scheduled a Peer to Peer for Dr. Eulas Post at 12:30pm per Dr. Eulas Post.

## 2017-10-17 NOTE — Telephone Encounter (Signed)
[place] Scientist, physiological) please follow-up with Dr.Carter today to address setting up time for peer to peer  Thanks

## 2017-10-18 NOTE — Telephone Encounter (Signed)
Rescheduled Dr. Eulas Post Peer to Peer to 1:30pm per Dr. Eulas Post.

## 2017-10-18 NOTE — Telephone Encounter (Signed)
Per Dr. Eulas Post MRI did get approve. Stated that they would fax over approval.

## 2017-10-21 ENCOUNTER — Ambulatory Visit: Payer: Medicare HMO | Admitting: Physical Therapy

## 2017-10-21 NOTE — Telephone Encounter (Signed)
Received Faxed APPROVAL from [facility] [phone] Authorization #:[id number] Member ID#:[id number]  Authorized [id number] MRI of lower back without contrast Valid 10/19/17-01/17/18  (paperwork placed on Dorothy's desk)

## 2017-10-23 NOTE — Telephone Encounter (Signed)
Appointment is scheduled for next Tuesday

## 2017-10-24 ENCOUNTER — Ambulatory Visit: Payer: Medicare HMO | Attending: Internal Medicine | Admitting: Physical Therapy

## 2017-10-24 DIAGNOSIS — M545 Low back pain: Secondary | ICD-10-CM | POA: Insufficient documentation

## 2017-10-24 DIAGNOSIS — M25552 Pain in left hip: Secondary | ICD-10-CM | POA: Insufficient documentation

## 2017-10-24 DIAGNOSIS — R2689 Other abnormalities of gait and mobility: Secondary | ICD-10-CM | POA: Insufficient documentation

## 2017-10-24 DIAGNOSIS — M62830 Muscle spasm of back: Secondary | ICD-10-CM | POA: Diagnosis not present

## 2017-10-24 DIAGNOSIS — M25551 Pain in right hip: Secondary | ICD-10-CM | POA: Diagnosis not present

## 2017-10-24 DIAGNOSIS — G8929 Other chronic pain: Secondary | ICD-10-CM | POA: Diagnosis not present

## 2017-10-24 NOTE — Therapy (Signed)
[place], [place], [address] Phone: [phone]   Fax:  [phone]  Physical Therapy Treatment  Patient Details  Name: Julie Walter MRN: [id number] Date of Birth: 1919-04-10 Referring Provider: Gildardo Cranker, DO   Encounter Date: 10/24/2017  PT End of Session - 10/24/17 1135    Visit Number  2    Number of Visits  12    Date for PT Re-Evaluation  10/30/17    PT Start Time  1018    PT Stop Time  1120    PT Time Calculation (min)  62 min    Activity Tolerance  Patient tolerated treatment well    Behavior During Therapy  [facility] for tasks assessed/performed       Past Medical History:  Diagnosis Date  . Anemia   . Aortic stenosis   . Arthritis   . Breast cancer ([place])   . Carpal tunnel syndrome   . Chronic pain   . Colon cancer ([place])   . Fibromyalgia   . Hematuria   . History of hip replacement    bilateral  . Hyperglycemia   . Hyperlipidemia   . Hypertension   . Lumbago   . Memory loss   . Osteoarthritis   . Osteopenia     Past Surgical History:  Procedure Laterality Date  . ABDOMINAL HYSTERECTOMY  1958  . BREAST LUMPECTOMY  2008  . CHOLECYSTECTOMY  2002  . colon cancer tumor  1993   Dr. Jens Som  . hip replacements     2013 and 2014    There were no vitals filed for this visit.      [facility] PT Assessment - 10/24/17 0001      Berg Balance Test   Merrilee Jansky comment:  40/56                  [facility] Adult PT Treatment/Exercise - 10/24/17 0001      Standardized Balance Assessment   Standardized Balance Assessment  Berg Balance Test      Berg Balance Test   Sit to Stand  Able to stand without using hands and stabilize independently    Standing Unsupported  Able to stand safely 2 minutes    Sitting with Back Unsupported but Feet Supported on Floor or Stool  Able to sit safely and securely 2 minutes    Stand to Sit  Sits safely with minimal use of hands    Transfers  Able  to transfer safely, minor use of hands    Standing Unsupported with Eyes Closed  Able to stand 10 seconds with supervision    Standing Ubsupported with Feet Together  Able to place feet together independently and stand for 1 minute with supervision    From Standing, Reach Forward with Outstretched Arm  Can reach forward >12 cm safely (5")    From Standing Position, Pick up Object from [place] to pick up shoe safely and easily    From Standing Position, Turn to Look Behind Over each Shoulder  Needs assist to keep from losing balance and falling    Turn 360 Degrees  Able to turn 360 degrees safely in 4 seconds or less    Standing Unsupported, Alternately Place Feet on Step/Stool  Able to complete >2 steps/needs minimal assist    Standing Unsupported, One Foot in Front  Loses balance while stepping or standing    Standing on One Leg  Able to lift leg independently and hold equal  to or more than 3 seconds    Total Score  40      Self-Care   Self-Care  Other Self-Care Comments    Other Self-Care Comments   use of heat / cold for pain      Lumbar Exercises: Standing   Other Standing Lumbar Exercises  [place] lateral  2 reps to right .  Feels like my muscles are stretched as far as they can go.       Knee/Hip Exercises: Stretches   Passive Hamstring Stretch  2 reps;30 seconds right    Hip Flexor Stretch  1 rep;10 seconds;Both while on each side    Piriformis Stretch  10 seconds    Piriformis Stretch Limitations  also showed how to do on side.    Knees bent,  lowering    Gastroc Stretch  1 rep;10 seconds      Modalities   Modalities  Cryotherapy;Moist Heat      Moist Heat Therapy   Moist Heat Location  Hip groin,  lateral concurent with manual and stretching      Cryotherapy   Number Minutes Cryotherapy  10 Minutes    Cryotherapy Location  Hip right    Type of Cryotherapy  -- cold pack      Manual Therapy   Manual Therapy  Soft tissue mobilization;Passive ROM    Manual therapy  comments  tissue softened    Soft tissue mobilization  hips    Passive ROM  hips             PT Education - 10/24/17 1135    Education provided  Yes    Education Details  HEP,  current,  use of heat / cold    Person(s) Educated  Child(ren);Patient    Comprehension  Verbalized understanding;Returned demonstration       PT Short Term Goals - 10/09/17 1655      PT SHORT TERM GOAL #1   Title  Pt. will be I with initial HEP.     Time  3    Period  Weeks    Status  New    Target Date  10/30/17      PT SHORT TERM GOAL #2   Title  Pt. will demonstrate proper sit-to-stand body mechanics to decrease stress on lumbar spine and LE for pain relief.     Time  3    Period  Weeks    Status  New    Target Date  10/30/17      PT SHORT TERM GOAL #3   Title  Reduce N/T in RLE for >/= 3 days to demonstrate improvement and effectiveness of therapuetic intervention.    Time  3    Period  Weeks    Status  New    Target Date  10/30/17        PT Long Term Goals - 10/09/17 1656      PT LONG TERM GOAL #1   Title  Pt. will increase bilateral overall hip strength >/= 4/5 for LE stability to promote functional gait pattern.     Time  6    Period  Weeks    Status  New    Target Date  11/20/17      PT LONG TERM GOAL #2   Title  Pt. will demonstrate proper reciprocal gait pattern with </= 2/10 pain for increased ability for community ambulation.    Time  6    Period  Weeks  Status  New    Target Date  11/20/17      PT LONG TERM GOAL #3   Title  Pt. will increase standing/walking tolerance >/= 30 minutes to resume recreational walking for achievement of pt. goal.     Time  6    Period  Weeks    Status  New    Target Date  11/20/17      PT LONG TERM GOAL #4   Title  Pt. will increase FOTO score and decrease limitations to </= 50% predicted score.     Time  6    Period  Weeks    Status  New    Target Date  11/20/17            Plan - 10/24/17 1136    Clinical  Impression Statement  Patient had increased pain at end of session after lateral trunk stretch at door,  [place].  pain increased to 6-7/10.  pain decreased with cold pack.  I did not feel she was ready for more exercises until she was independent with current .  She was able to get both hips to 0 PROM  extension on side.  No back pain today.  Leg length grossley equal when she corrects weight shift onto right.  ( Increased pain.    PT Next Visit Plan  assess HEP,  PT to review  Berg Balance assessment results.  hip/lumbar stretching, core stabiliztion, sit-to-stand mechanics, gait training, balance training Consider calf stretch,  consider taping for hips.    PT Home Exercise Plan  sidelying clamshell, tennis ball self trigger point release, seated piriformis stretch     Consulted and Agree with Plan of Care  Family member/caregiver    Family Member Consulted  Daughter       Patient will benefit from skilled therapeutic intervention in order to improve the following deficits and impairments:     Visit Diagnosis: Chronic bilateral low back pain, with sciatica presence unspecified  Muscle spasm of back  Other abnormalities of gait and mobility  Pain in left hip  Pain in right hip     Problem List There are no active problems to display for this patient.   HARRIS,KAREN  PTA 10/24/2017, 11:51 AM  [facility] [address] [place], [place], [address] Phone: [phone]   Fax:  [phone]  Name: Kalista Laguardia MRN: [id number] Date of Birth: 31-Aug-1919

## 2017-10-28 ENCOUNTER — Encounter: Payer: Self-pay | Admitting: Physical Therapy

## 2017-10-28 ENCOUNTER — Ambulatory Visit: Payer: Medicare HMO | Admitting: Physical Therapy

## 2017-10-28 DIAGNOSIS — M545 Low back pain: Principal | ICD-10-CM

## 2017-10-28 DIAGNOSIS — R2689 Other abnormalities of gait and mobility: Secondary | ICD-10-CM | POA: Diagnosis not present

## 2017-10-28 DIAGNOSIS — M25551 Pain in right hip: Secondary | ICD-10-CM | POA: Diagnosis not present

## 2017-10-28 DIAGNOSIS — M62830 Muscle spasm of back: Secondary | ICD-10-CM | POA: Diagnosis not present

## 2017-10-28 DIAGNOSIS — M25552 Pain in left hip: Secondary | ICD-10-CM

## 2017-10-28 DIAGNOSIS — G8929 Other chronic pain: Secondary | ICD-10-CM

## 2017-10-28 NOTE — Therapy (Signed)
[place], [place], [address] Phone: [phone]   Fax:  [phone]  Physical Therapy Treatment  Patient Details  Name: Julie Walter MRN: [id number] Date of Birth: [birth year] Referring Provider: Gildardo Cranker, DO   Encounter Date: 10/28/2017  PT End of Session - 10/28/17 1718    Visit Number  3    Number of Visits  12    Date for PT Re-Evaluation  10/30/17    Authorization Type  MCR: Kx mod by 15th visit, progress note by 10th visit.     PT Start Time  1417    PT Stop Time  1508    PT Time Calculation (min)  51 min    Activity Tolerance  Patient tolerated treatment well    Behavior During Therapy  WFL for tasks assessed/performed       Past Medical History:  Diagnosis Date  . Anemia   . Aortic stenosis   . Arthritis   . Breast cancer ([place])   . Carpal tunnel syndrome   . Chronic pain   . Colon cancer ([place])   . Fibromyalgia   . Hematuria   . History of hip replacement    bilateral  . Hyperglycemia   . Hyperlipidemia   . Hypertension   . Lumbago   . Memory loss   . Osteoarthritis   . Osteopenia     Past Surgical History:  Procedure Laterality Date  . ABDOMINAL HYSTERECTOMY  1958  . BREAST LUMPECTOMY  2008  . CHOLECYSTECTOMY  2002  . colon cancer tumor  1993   Dr. Jens Som  . hip replacements     2013 and 2014    There were no vitals filed for this visit.  Subjective Assessment - 10/28/17 1422    Subjective  "I still get some pain, in the hip but i do the execise where I use the tennis ball to relieve pressure"    Currently in Pain?  Yes    Pain Score  4     Pain Location  Back    Aggravating Factors   laying, prolonged, sitting, prolonged                      [facility] Adult PT Treatment/Exercise - 10/28/17 1441      Lumbar Exercises: Stretches   Prone on Elbows Stretch  -- 2 x 10    Prone on Elbows Stretch Limitations  monitoring pt throughout session       Lumbar Exercises: Standing   Other Standing Lumbar Exercises  prone extension in standing 2 x 10 cues to avoid excessive extension      Knee/Hip Exercises: Supine   Bridges with Ball Squeeze  2 sets;15 reps 1 set with glute squeeze      Knee/Hip Exercises: Sidelying   Hip ABduction  2 sets;15 reps    Other Sidelying Knee/Hip Exercises  reverse clam shell 2 x 15 in L sidelying      Cryotherapy   Number Minutes Cryotherapy  10 Minutes    Cryotherapy Location  Hip in supine in decompression position.     Type of Cryotherapy  Ice pack      Manual Therapy   Soft tissue mobilization  IASTM over the R piriformis and L hamstring               PT Short Term Goals - 10/09/17 1655      PT SHORT TERM GOAL #1  Title  Pt. will be I with initial HEP.     Time  3    Period  Weeks    Status  New    Target Date  10/30/17      PT SHORT TERM GOAL #2   Title  Pt. will demonstrate proper sit-to-stand body mechanics to decrease stress on lumbar spine and LE for pain relief.     Time  3    Period  Weeks    Status  New    Target Date  10/30/17      PT SHORT TERM GOAL #3   Title  Reduce N/T in RLE for >/= 3 days to demonstrate improvement and effectiveness of therapuetic intervention.    Time  3    Period  Weeks    Status  New    Target Date  10/30/17        PT Long Term Goals - 10/09/17 1656      PT LONG TERM GOAL #1   Title  Pt. will increase bilateral overall hip strength >/= 4/5 for LE stability to promote functional gait pattern.     Time  6    Period  Weeks    Status  New    Target Date  11/20/17      PT LONG TERM GOAL #2   Title  Pt. will demonstrate proper reciprocal gait pattern with </= 2/10 pain for increased ability for community ambulation.    Time  6    Period  Weeks    Status  New    Target Date  11/20/17      PT LONG TERM GOAL #3   Title  Pt. will increase standing/walking tolerance >/= 30 minutes to resume recreational walking for achievement of pt.  goal.     Time  6    Period  Weeks    Status  New    Target Date  11/20/17      PT LONG TERM GOAL #4   Title  Pt. will increase FOTO score and decrease limitations to </= 50% predicted score.     Time  6    Period  Weeks    Status  New    Target Date  11/20/17            Plan - 10/28/17 1719    Clinical Impression Statement  pt reported continued pain inthe R posterior hip with occasional pain bil. pt plans to get further imaging tomorrow to assess if back is primary issues causing referred sx. continued soft tissue work and hip strengthening which she performed well. Attmped prone / standing extension which she reported increased soreness and referral to the LLE. post session utilized ice pack to calm down pain muscle soreness in  decompression position.     PT Treatment/Interventions  ADLs/Self Care Home Management;Cryotherapy;Electrical Stimulation;Moist Heat;Traction;Therapeutic exercise;Therapeutic activities;Functional mobility training;Gait training;Balance training;Patient/family education;Manual techniques    PT Next Visit Plan  ERO, PT to review  Berg Balance assessment results.  hip/lumbar stretching, core stabiliztion, sit-to-stand mechanics, gait training, balance training Consider calf stretch,  consider taping for hips.    Consulted and Agree with Plan of Care  Family member/caregiver;Patient    Family Member Consulted  Daughter       Patient will benefit from skilled therapeutic intervention in order to improve the following deficits and impairments:  Abnormal gait, Decreased activity tolerance, Improper body mechanics, Increased muscle spasms, Difficulty walking, Decreased strength, Pain  Visit Diagnosis: Chronic bilateral  low back pain, with sciatica presence unspecified  Muscle spasm of back  Other abnormalities of gait and mobility  Pain in left hip  Pain in right hip     Problem List There are no active problems to display for this  patient.  Starr Lake PT, DPT, LAT, ATC  10/28/17  5:23 PM      [facility] [address] [place], [place], [address] Phone: [phone]   Fax:  [phone]  Name: Julie Walter MRN: [id number] Date of Birth: October 07, 1919

## 2017-10-29 ENCOUNTER — Ambulatory Visit
Admission: RE | Admit: 2017-10-29 | Discharge: 2017-10-29 | Disposition: A | Discharge status: Home / Self Care | Payer: Medicare HMO | Source: Ambulatory Visit | Attending: Internal Medicine | Admitting: Internal Medicine

## 2017-10-29 DIAGNOSIS — M4726 Other spondylosis with radiculopathy, lumbar region: Secondary | ICD-10-CM

## 2017-10-29 DIAGNOSIS — M48061 Spinal stenosis, lumbar region without neurogenic claudication: Secondary | ICD-10-CM | POA: Diagnosis not present

## 2017-10-30 ENCOUNTER — Encounter: Payer: Self-pay | Admitting: Physical Therapy

## 2017-10-30 ENCOUNTER — Ambulatory Visit: Payer: Medicare HMO | Admitting: Physical Therapy

## 2017-10-30 DIAGNOSIS — M62830 Muscle spasm of back: Secondary | ICD-10-CM | POA: Diagnosis not present

## 2017-10-30 DIAGNOSIS — M25551 Pain in right hip: Secondary | ICD-10-CM | POA: Diagnosis not present

## 2017-10-30 DIAGNOSIS — M545 Low back pain: Secondary | ICD-10-CM | POA: Diagnosis not present

## 2017-10-30 DIAGNOSIS — G8929 Other chronic pain: Secondary | ICD-10-CM | POA: Diagnosis not present

## 2017-10-30 DIAGNOSIS — M25552 Pain in left hip: Secondary | ICD-10-CM

## 2017-10-30 DIAGNOSIS — R2689 Other abnormalities of gait and mobility: Secondary | ICD-10-CM

## 2017-10-30 NOTE — Therapy (Signed)
[place], [place], [address] Phone: [phone]   Fax:  [phone]  Physical Therapy Treatment  Patient Details  Name: Julie Walter MRN: [id number] Date of Birth: [birth year] Referring Provider: Gildardo Cranker, DO   Encounter Date: 10/30/2017  PT End of Session - 10/30/17 1512    Visit Number  4    Number of Visits  12    Date for PT Re-Evaluation  10/30/17    Authorization Type  MCR: Kx mod by 15th visit, progress note by 10th visit.     PT Start Time  1418    PT Stop Time  1510    PT Time Calculation (min)  52 min    Activity Tolerance  Patient tolerated treatment well    Behavior During Therapy  WFL for tasks assessed/performed       Past Medical History:  Diagnosis Date  . Anemia   . Aortic stenosis   . Arthritis   . Breast cancer ([place])   . Carpal tunnel syndrome   . Chronic pain   . Colon cancer ([place])   . Fibromyalgia   . Hematuria   . History of hip replacement    bilateral  . Hyperglycemia   . Hyperlipidemia   . Hypertension   . Lumbago   . Memory loss   . Osteoarthritis   . Osteopenia     Past Surgical History:  Procedure Laterality Date  . ABDOMINAL HYSTERECTOMY  1958  . BREAST LUMPECTOMY  2008  . CHOLECYSTECTOMY  2002  . colon cancer tumor  1993   Dr. Jens Som  . hip replacements     2013 and 2014    There were no vitals filed for this visit.  Subjective Assessment - 10/30/17 1423    Subjective  "I felt alittle better today, some days I feel sore and some days I am better"     Currently in Pain?  Yes    Pain Score  3     Pain Location  Back    Pain Type  Chronic pain    Pain Onset  More than a month ago    Pain Frequency  Intermittent                      [facility] Adult PT Treatment/Exercise - 10/30/17 0001      Lumbar Exercises: Seated   Other Seated Lumbar Exercises  seated pelvic tilt on dyna-disc 2 x 10, 2 x 10 marching while on dyna-disc       Lumbar Exercises: Supine   Other Supine Lumbar Exercises  lower trunk rotation with feet on physioball 2x 20     Other Supine Lumbar Exercises  supine posterior pelvic tilt 2 x 10 with 5 sec hold      Knee/Hip Exercises: Supine   Other Supine Knee/Hip Exercises  clams 2 x 10 with red theraband      Manual Therapy   Manual Therapy  Manual Traction    Manual therapy comments  MTPR over The R piriformis    Manual Traction  with feet on physioball, using bed sheet to provide lumbar traction x 5 min pt reported signficant relief of pain             PT Education - 10/30/17 1532    Education provided  Yes    Education Details  updated HEP     Person(s) Educated  Patient;Child(ren)    Methods  Explanation  Comprehension  Verbalized understanding       PT Short Term Goals - 10/09/17 1655      PT SHORT TERM GOAL #1   Title  Pt. will be I with initial HEP.     Time  3    Period  Weeks    Status  New    Target Date  10/30/17      PT SHORT TERM GOAL #2   Title  Pt. will demonstrate proper sit-to-stand body mechanics to decrease stress on lumbar spine and LE for pain relief.     Time  3    Period  Weeks    Status  New    Target Date  10/30/17      PT SHORT TERM GOAL #3   Title  Reduce N/T in RLE for >/= 3 days to demonstrate improvement and effectiveness of therapuetic intervention.    Time  3    Period  Weeks    Status  New    Target Date  10/30/17        PT Long Term Goals - 10/09/17 1656      PT LONG TERM GOAL #1   Title  Pt. will increase bilateral overall hip strength >/= 4/5 for LE stability to promote functional gait pattern.     Time  6    Period  Weeks    Status  New    Target Date  11/20/17      PT LONG TERM GOAL #2   Title  Pt. will demonstrate proper reciprocal gait pattern with </= 2/10 pain for increased ability for community ambulation.    Time  6    Period  Weeks    Status  New    Target Date  11/20/17      PT LONG TERM GOAL #3   Title   Pt. will increase standing/walking tolerance >/= 30 minutes to resume recreational walking for achievement of pt. goal.     Time  6    Period  Weeks    Status  New    Target Date  11/20/17      PT LONG TERM GOAL #4   Title  Pt. will increase FOTO score and decrease limitations to </= 50% predicted score.     Time  6    Period  Weeks    Status  New    Target Date  11/20/17            Plan - 10/30/17 1532    Clinical Impression Statement  pt reported decreased pain compared to previous session. focused on decompression of the spine utilizing flexion biased treatment with feet on physioball. She was able to perform all exercises with minimal report of pain. continued hip/ core strengthening. post session continued MHP in decompression positon..     PT Next Visit Plan  ERO, PT to review  Berg Balance assessment results.  hip/lumbar stretching, core stabiliztion, sit-to-stand mechanics, gait training, balance training Consider calf stretch,  consider taping for hips.    PT Home Exercise Plan  sidelying clamshell, tennis ball self trigger point release, seated piriformis stretch     Consulted and Agree with Plan of Care  Patient    Family Member Consulted  Daughter       Patient will benefit from skilled therapeutic intervention in order to improve the following deficits and impairments:     Visit Diagnosis: Chronic bilateral low back pain, with sciatica presence unspecified  Muscle spasm of  back  Other abnormalities of gait and mobility  Pain in left hip  Pain in right hip     Problem List There are no active problems to display for this patient.  Starr Lake PT, DPT, LAT, ATC  10/30/17  3:36 PM      [facility] [address] [place], [place], [address] Phone: [phone]   Fax:  [phone]  Name: Julie Walter MRN: [id number] Date of Birth: Jul 14, 1919

## 2017-10-31 ENCOUNTER — Encounter: Payer: Self-pay | Admitting: Internal Medicine

## 2017-11-07 ENCOUNTER — Encounter: Payer: Self-pay | Admitting: Physical Therapy

## 2017-11-07 ENCOUNTER — Ambulatory Visit: Payer: Medicare HMO | Admitting: Physical Therapy

## 2017-11-07 DIAGNOSIS — G8929 Other chronic pain: Secondary | ICD-10-CM | POA: Diagnosis not present

## 2017-11-07 DIAGNOSIS — M25551 Pain in right hip: Secondary | ICD-10-CM

## 2017-11-07 DIAGNOSIS — M545 Low back pain: Secondary | ICD-10-CM | POA: Diagnosis not present

## 2017-11-07 DIAGNOSIS — M62830 Muscle spasm of back: Secondary | ICD-10-CM | POA: Diagnosis not present

## 2017-11-07 DIAGNOSIS — R2689 Other abnormalities of gait and mobility: Secondary | ICD-10-CM

## 2017-11-07 DIAGNOSIS — M25552 Pain in left hip: Secondary | ICD-10-CM | POA: Diagnosis not present

## 2017-11-07 NOTE — Therapy (Addendum)
[place], [place], [address] Phone: [phone]   Fax:  [phone]  Physical Therapy Treatment  Patient Details  Name: Julie Walter MRN: [id number] Date of Birth: 26-May-1919 Referring Provider: Gildardo Cranker, DO   Encounter Date: 11/07/2017  PT End of Session - 11/07/17 1335    Visit Number  5    Number of Visits  12    Date for PT Re-Evaluation  11/20/17    Authorization Type  MCR: Kx mod by 15th visit, progress note by 10th visit.     PT Start Time  0130    PT Stop Time  0215    PT Time Calculation (min)  45 min       Past Medical History:  Diagnosis Date  . Anemia   . Aortic stenosis   . Arthritis   . Breast cancer ([place])   . Carpal tunnel syndrome   . Chronic pain   . Colon cancer ([place])   . Fibromyalgia   . Hematuria   . History of hip replacement    bilateral  . Hyperglycemia   . Hyperlipidemia   . Hypertension   . Lumbago   . Memory loss   . Osteoarthritis   . Osteopenia     Past Surgical History:  Procedure Laterality Date  . ABDOMINAL HYSTERECTOMY  1958  . BREAST LUMPECTOMY  2008  . CHOLECYSTECTOMY  2002  . colon cancer tumor  1993   Dr. Jens Som  . hip replacements     2013 and 2014    There were no vitals filed for this visit.  Subjective Assessment - 11/07/17 1332    Subjective  I was feeling better and then last night I had a bad night. I am in pain right now. 7/10.     Currently in Pain?  Yes    Pain Score  7     Pain Location  Hip    Pain Orientation  Right    Pain Descriptors / Indicators  Stabbing    Pain Type  Chronic pain    Pain Radiating Towards  right buttock, posterior upper leg     Aggravating Factors   sitting prolonged     Pain Relieving Factors  meds, rest, heat, ice                       [facility] Adult PT Treatment/Exercise - 11/07/17 0001      Lumbar Exercises: Stretches   Passive Hamstring Stretch  2 reps;30 seconds    Piriformis Stretch  3 reps;30 seconds      Knee/Hip Exercises: Supine   Other Supine Knee/Hip Exercises  clams 2 x 10 with red theraband      Knee/Hip Exercises: Sidelying   Hip ABduction  10 reps    Clams  x 10  each     Other Sidelying Knee/Hip Exercises  reverse x 10 each      Manual Therapy   Manual therapy comments  MTPR over bilat  piriformis    Soft tissue mobilization  IASTM over bialteral piriformis                PT Short Term Goals - 10/09/17 1655      PT SHORT TERM GOAL #1   Title  Pt. will be I with initial HEP.     Time  3    Period  Weeks    Status  New    Target  Date  10/30/17      PT SHORT TERM GOAL #2   Title  Pt. will demonstrate proper sit-to-stand body mechanics to decrease stress on lumbar spine and LE for pain relief.     Time  3    Period  Weeks    Status  New    Target Date  10/30/17      PT SHORT TERM GOAL #3   Title  Reduce N/T in RLE for >/= 3 days to demonstrate improvement and effectiveness of therapuetic intervention.    Time  3    Period  Weeks    Status  New    Target Date  10/30/17        PT Long Term Goals - 10/09/17 1656      PT LONG TERM GOAL #1   Title  Pt. will increase bilateral overall hip strength >/= 4/5 for LE stability to promote functional gait pattern.     Time  6    Period  Weeks    Status  New    Target Date  11/20/17      PT LONG TERM GOAL #2   Title  Pt. will demonstrate proper reciprocal gait pattern with </= 2/10 pain for increased ability for community ambulation.    Time  6    Period  Weeks    Status  New    Target Date  11/20/17      PT LONG TERM GOAL #3   Title  Pt. will increase standing/walking tolerance >/= 30 minutes to resume recreational walking for achievement of pt. goal.     Time  6    Period  Weeks    Status  New    Target Date  11/20/17      PT LONG TERM GOAL #4   Title  Pt. will increase FOTO score and decrease limitations to </= 50% predicted score.     Time  6    Period   Weeks    Status  New    Target Date  11/20/17            Plan - 11/07/17 1421    Clinical Impression Statement  Pt reports severe pain last night preventing sleep until 5 am. She had to take a pain pill and used the tennis ball and heating pad for relief. She did not try exercise. We reviewed her HEP and encouraged her to perform 2 x per day. SHe was sitting out side in rocking chair for over an hour with a cushion in the seat. She thinks this could have flared up her pain. The pain is located in right gluteal and piriformis radiating to upper hamstring. After therex and manul TPR, pt reports pain reduced to 4/10. Upon standing she noted the pain was in bilateral buttocks so also performed manual soft tissue/ TPR to left hip. Pt interested in continuing PT for strength and balance.     PT Next Visit Plan  ERO,assess pain,  hip/lumbar stretching, core stabiliztion, sit-to-stand mechanics, gait training, balance training Consider calf stretch,  consider taping for hips.    PT Home Exercise Plan  sidelying clamshell, tennis ball self trigger point release, seated piriformis stretch     Consulted and Agree with Plan of Care  Patient    Family Member Consulted  Daughter       Patient will benefit from skilled therapeutic intervention in order to improve the following deficits and impairments:  Abnormal gait, Decreased activity tolerance,  Improper body mechanics, Increased muscle spasms, Difficulty walking, Decreased strength, Pain  Visit Diagnosis: Chronic bilateral low back pain, with sciatica presence unspecified  Other abnormalities of gait and mobility  Pain in left hip  Pain in right hip  Muscle spasm of back     Problem List There are no active problems to display for this patient.   Dorene Ar, [place] 11/07/2017, 2:28 PM  [facility] [address] [place], [place], [address] Phone: [phone]   Fax:   [phone]  Name: Julie Walter MRN: [id number] Date of Birth: July 18, 1919

## 2017-11-14 ENCOUNTER — Encounter: Payer: Self-pay | Admitting: Physical Therapy

## 2017-11-14 ENCOUNTER — Ambulatory Visit: Payer: Medicare HMO | Attending: Internal Medicine | Admitting: Physical Therapy

## 2017-11-14 DIAGNOSIS — M62830 Muscle spasm of back: Secondary | ICD-10-CM | POA: Diagnosis not present

## 2017-11-14 DIAGNOSIS — G8929 Other chronic pain: Secondary | ICD-10-CM

## 2017-11-14 DIAGNOSIS — M545 Low back pain: Secondary | ICD-10-CM | POA: Diagnosis not present

## 2017-11-14 DIAGNOSIS — R2689 Other abnormalities of gait and mobility: Secondary | ICD-10-CM | POA: Insufficient documentation

## 2017-11-14 DIAGNOSIS — M25552 Pain in left hip: Secondary | ICD-10-CM

## 2017-11-14 DIAGNOSIS — M25551 Pain in right hip: Secondary | ICD-10-CM | POA: Insufficient documentation

## 2017-11-14 NOTE — Therapy (Signed)
[address], [place], [address] Phone: [phone]   Fax:  [phone]  Physical Therapy Treatment / Re-certification  Patient Details  Name: Julie Walter MRN: [id number] Date of Birth: [birth year] Referring Provider: Gildardo Cranker, DO   Encounter Date: 11/14/2017  PT End of Session - 11/14/17 1504    Visit Number  6    Number of Visits  12    Date for PT Re-Evaluation  12/12/17    Authorization Type  MCR: Kx mod by 15th visit, progress note by 10th visit.     PT Start Time  1415    PT Stop Time  1510    PT Time Calculation (min)  55 min    Activity Tolerance  Patient tolerated treatment well    Behavior During Therapy  WFL for tasks assessed/performed       Past Medical History:  Diagnosis Date  . Anemia   . Aortic stenosis   . Arthritis   . Breast cancer ([place])   . Carpal tunnel syndrome   . Chronic pain   . Colon cancer ([place])   . Fibromyalgia   . Hematuria   . History of hip replacement    bilateral  . Hyperglycemia   . Hyperlipidemia   . Hypertension   . Lumbago   . Memory loss   . Osteoarthritis   . Osteopenia     Past Surgical History:  Procedure Laterality Date  . ABDOMINAL HYSTERECTOMY  1958  . BREAST LUMPECTOMY  2008  . CHOLECYSTECTOMY  2002  . colon cancer tumor  1993   Dr. Jens Som  . hip replacements     2013 and 2014    There were no vitals filed for this visit.  Subjective Assessment - 11/14/17 1421    Subjective  " I feel like I am getting more sore and I do the exercises and they feel like they make more more sore"     Currently in Pain?  Yes    Pain Score  6     Pain Orientation  Right    Pain Descriptors / Indicators  Stabbing    Pain Type  Chronic pain    Aggravating Factors   prolonged sitting    Pain Relieving Factors  meds, rest, laying on the back in decompression position.          [facility] PT Assessment - 11/14/17 1423      AROM   Lumbar Flexion   80    Lumbar Extension  28    Lumbar - Right Side Bend  20    Lumbar - Left Side Bend  20      Strength   Right Hip Flexion  4-/5    Right Hip External Rotation   3+/5    Right Hip Internal Rotation  3+/5    Right Hip ABduction  4/5    Left Hip Flexion  4-/5    Left Hip External Rotation  3+/5    Left Hip Internal Rotation  3+/5    Left Hip ABduction  4/5                  [facility] Adult PT Treatment/Exercise - 11/14/17 1441      Lumbar Exercises: Supine   Bent Knee Raise  20 reps x 2 sets, with verbal cues to keep core tight throughout    Other Supine Lumbar Exercises  lower trunk rotation with feet on physioball 2x 20  Other Supine Lumbar Exercises  supine posterior pelvic tilt 2 x 10 with 5 sec hold      Knee/Hip Exercises: Stretches   Passive Hamstring Stretch  2 reps;30 seconds bil    Piriformis Stretch  2 reps;30 seconds verbal cues for holding stretch longer and avoiding pain      Knee/Hip Exercises: Supine   Other Supine Knee/Hip Exercises  clams 2 x 10 with red theraband      Knee/Hip Exercises: Sidelying   Hip ABduction  10 reps      Moist Heat Therapy   Number Minutes Moist Heat  10 Minutes    Moist Heat Location  Hip proximal hamstrings bil in decompression position      Manual Therapy   Manual Therapy  Neural Stretch    Manual therapy comments  MTPR over bilat proximal hamstrings    Soft tissue mobilization  IASTM over bialteral proximal hamstrings    Neural Stretch  bil sciatic nerve glides in supine during hamstring stretch with ankle pumps 2 x ea with 15 pumps attempted seated but pt couldn't tolerate it             PT Education - 11/14/17 1548    Education provided  Yes    Education Details  reviewed HEP and modifited technique to avoid pain during stretching. discussed at length with both pt and daughter if pain continues to persist and she is making no more progress then we may need to see about referring back to her MD.     Terence Lux)  Educated  Patient;Child(ren)    Methods  Explanation;Handout;Verbal cues    Comprehension  Verbalized understanding;Verbal cues required       PT Short Term Goals - 11/14/17 1428      PT SHORT TERM GOAL #1   Title  Pt. will be I with initial HEP.     Time  3    Period  Weeks    Status  Achieved      PT SHORT TERM GOAL #2   Title  Pt. will demonstrate proper sit-to-stand body mechanics to decrease stress on lumbar spine and LE for pain relief.     Time  3    Period  Weeks    Status  On-going      PT SHORT TERM GOAL #3   Title  Reduce N/T in RLE for >/= 3 days to demonstrate improvement and effectiveness of therapuetic intervention.    Time  3    Period  Weeks    Status  On-going        PT Long Term Goals - 11/14/17 1429      PT LONG TERM GOAL #1   Title  Pt. will increase bilateral overall hip strength >/= 4/5 for LE stability to promote functional gait pattern.     Time  6    Period  Weeks    Status  On-going      PT LONG TERM GOAL #2   Title  Pt. will demonstrate proper reciprocal gait pattern with </= 2/10 pain for increased ability for community ambulation.    Period  Weeks    Status  On-going      PT LONG TERM GOAL #3   Title  Pt. will increase standing/walking tolerance >/= 30 minutes to resume recreational walking for achievement of pt. goal.     Time  6    Period  Weeks    Status  On-going  PT LONG TERM GOAL #4   Title  Pt. will increase FOTO score and decrease limitations to </= 50% predicted score.     Time  6            Plan - 11/14/17 1550    Clinical Impression Statement  pt reports increased pain the hip that started the last few days, pain predeminitely located in bil proximal hamstrings. She has improved back mobility, and strength of bil LE but conitnues to have pain.  Reviewed HEP which she required mulitple verbal / tactile cues for proper form and to avoid stretching to the point of pain. She reported decreased pain in the hamstring /  sciatic region. Conitinued MHP in supine in decompression position which she reported decreased pain at end session. Discussed at length if no progress is made then to refer back to her MD in the next few visits.     PT Frequency  2x / week    PT Duration  3 weeks    PT Treatment/Interventions  ADLs/Self Care Home Management;Cryotherapy;Electrical Stimulation;Moist Heat;Traction;Therapeutic exercise;Therapeutic activities;Functional mobility training;Gait training;Balance training;Patient/family education;Manual techniques    PT Next Visit Plan  assess pain,  hip/lumbar stretching, core stabiliztion, sit-to-stand mechanics, gait training, balance training Consider calf stretch,  nerve glides    PT Home Exercise Plan  sidelying clamshell, tennis ball self trigger point release, seated piriformis stretch, hamstring stretch     Consulted and Agree with Plan of Care  Patient       Patient will benefit from skilled therapeutic intervention in order to improve the following deficits and impairments:  Abnormal gait, Decreased activity tolerance, Improper body mechanics, Increased muscle spasms, Difficulty walking, Decreased strength, Pain  Visit Diagnosis: Chronic bilateral low back pain, with sciatica presence unspecified  Other abnormalities of gait and mobility  Pain in left hip  Pain in right hip  Muscle spasm of back     Problem List There are no active problems to display for this patient.  Starr [place] PT, DPT, LAT, ATC  11/14/17  4:06 PM      [facility] [address] [place], [place], [address] Phone: [phone]   Fax:  [phone]  Name: Julie Walter MRN: [id number] Date of Birth: 1919-02-14

## 2017-11-15 ENCOUNTER — Other Ambulatory Visit: Payer: Self-pay | Admitting: *Deleted

## 2017-11-15 DIAGNOSIS — M4726 Other spondylosis with radiculopathy, lumbar region: Secondary | ICD-10-CM

## 2017-11-15 MED ORDER — OXYCODONE HCL 5 MG PO TABS: 5.0000 mg | ORAL_TABLET | ORAL | 0 refills | Status: DC | PRN

## 2017-11-15 NOTE — Telephone Encounter (Signed)
Patient daughter is calling to confirm this was faxed to pharmacy. Patient will be running out tomorrow. Please send to pharmacy.

## 2017-11-15 NOTE — Telephone Encounter (Signed)
Patient requested [place] Confirmed Pended and sent to Dr. Eulas Post for approval.

## 2017-11-18 ENCOUNTER — Ambulatory Visit (INDEPENDENT_AMBULATORY_CARE_PROVIDER_SITE_OTHER): Payer: Medicare HMO

## 2017-11-18 ENCOUNTER — Encounter (INDEPENDENT_AMBULATORY_CARE_PROVIDER_SITE_OTHER): Payer: Self-pay | Admitting: Orthopedic Surgery

## 2017-11-18 ENCOUNTER — Ambulatory Visit (INDEPENDENT_AMBULATORY_CARE_PROVIDER_SITE_OTHER): Payer: Medicare HMO | Admitting: Orthopedic Surgery

## 2017-11-18 DIAGNOSIS — M19011 Primary osteoarthritis, right shoulder: Secondary | ICD-10-CM | POA: Diagnosis not present

## 2017-11-18 DIAGNOSIS — M545 Low back pain: Secondary | ICD-10-CM | POA: Diagnosis not present

## 2017-11-18 DIAGNOSIS — G8929 Other chronic pain: Secondary | ICD-10-CM

## 2017-11-18 DIAGNOSIS — M25512 Pain in left shoulder: Secondary | ICD-10-CM

## 2017-11-18 DIAGNOSIS — M19012 Primary osteoarthritis, left shoulder: Secondary | ICD-10-CM | POA: Diagnosis not present

## 2017-11-18 DIAGNOSIS — M25511 Pain in right shoulder: Secondary | ICD-10-CM

## 2017-11-21 ENCOUNTER — Telehealth: Payer: Self-pay | Admitting: *Deleted

## 2017-11-21 NOTE — Telephone Encounter (Signed)
Noted  

## 2017-11-21 NOTE — Telephone Encounter (Signed)
Patient daughter, Anne Ng called and stated that they went to [facility] on Monday and saw Dr. Marlou Sa and received a shot in shoulder. They told Dr. Marlou Sa about the ongoing back pain with no relief and he suggested them to see Dr. Ernestina Patches there at [facility].  Anne Ng just wanted to keep you informed of what's going on. Daughter stated that she is going to schedule the appointment with Dr. Ernestina Patches.

## 2017-11-22 ENCOUNTER — Encounter (INDEPENDENT_AMBULATORY_CARE_PROVIDER_SITE_OTHER): Payer: Self-pay | Admitting: Orthopedic Surgery

## 2017-11-22 DIAGNOSIS — M19011 Primary osteoarthritis, right shoulder: Secondary | ICD-10-CM | POA: Diagnosis not present

## 2017-11-22 DIAGNOSIS — M545 Low back pain: Secondary | ICD-10-CM | POA: Diagnosis not present

## 2017-11-22 DIAGNOSIS — M19012 Primary osteoarthritis, left shoulder: Secondary | ICD-10-CM

## 2017-11-22 MED ORDER — LIDOCAINE HCL 1 % IJ SOLN
5.0000 mL | INTRAMUSCULAR | Status: AC | PRN
  Administered 2017-11-22: 5 mL

## 2017-11-22 MED ORDER — METHYLPREDNISOLONE ACETATE 40 MG/ML IJ SUSP
40.0000 mg | INTRAMUSCULAR | Status: AC | PRN
  Administered 2017-11-22: 40 mg via INTRA_ARTICULAR

## 2017-11-22 MED ORDER — BUPIVACAINE HCL 0.5 % IJ SOLN
9.0000 mL | INTRAMUSCULAR | Status: AC | PRN
  Administered 2017-11-22: 9 mL via INTRA_ARTICULAR

## 2017-11-22 NOTE — Progress Notes (Signed)
Office Visit Note   Patient: Julie Walter           Date of Birth: 09/07/1919           MRN: [id number] Visit Date: 11/18/2017 Requested by: Gildardo Cranker, [place], [place] [address] PCP: Gildardo Cranker, DO  Subjective: Chief Complaint  Patient presents with  . Right Shoulder - Pain  . Left Shoulder - Pain    HPI: Julie Walter is a 84 year old patient with bilateral shoulder pain right worse than left.  Been ongoing for 4 years.  Patient states the pain is constant.  She reports decreased range of motion.  She states that she will wake from sleep at night with the pain.  Most of her pain is localized to the shoulder.  He just moved here from [place].  She received an injection about every 3 months but the last ones were done in June.  She also takes OxyContin for her back and leg pain from Dr. Eulas Post.  She has a history of therapy for her back.              ROS: All systems reviewed are negative as they relate to the chief complaint within the history of present illness.  Patient denies  fevers or chills.   Assessment & Plan: Visit Diagnoses:  1. Low back pain, unspecified back pain laterality, unspecified chronicity, with sciatica presence unspecified   2. Chronic pain of both shoulders     Plan: Impression is bilateral shoulder arthritis with management strategy of injections which have been helping.  We injected the right glenohumeral joint today.  Would also like to send her to Dr. Ernestina Patches for lumbar spine ESI as well as physical therapy.  Follow-up with me in 3-4 months for repeat shoulder injections.  Follow-Up Instructions: No Follow-up on file.   Orders:  Orders Placed This Encounter  Procedures  . XR Shoulder Right  . XR Shoulder Left  . Ambulatory referral to Physical Medicine Rehab   No orders of the defined types were placed in this encounter.     Procedures: Large Joint Inj: R glenohumeral on 11/22/2017 6:24 PM Indications: diagnostic  evaluation and pain Details: 18 G 1.5 in needle, posterior approach  Arthrogram: No  Medications: 9 mL bupivacaine 0.5 %; 40 mg methylPREDNISolone acetate 40 MG/ML; 5 mL lidocaine 1 % Outcome: tolerated well, no immediate complications Procedure, treatment alternatives, risks and benefits explained, specific risks discussed. Consent was given by the patient. Immediately prior to procedure a time out was called to verify the correct patient, procedure, equipment, support staff and site/side marked as required. Patient was prepped and draped in the usual sterile fashion.       Clinical Data: No additional findings.  Objective: Vital Signs: There were no vitals taken for this visit.  Physical Exam:   Constitutional: Patient appears well-developed HEENT:  Head: Normocephalic Eyes:EOM are normal Neck: Normal range of motion Cardiovascular: Normal rate Pulmonary/chest: Effort normal Neurologic: Patient is alert Skin: Skin is warm Psychiatric: Patient has normal mood and affect    Constitutional: Patient appears well-developed HEENT:  Head: Normocephalic Eyes:EOM are normal Neck: Normal range of motion Cardiovascular: Normal rate Pulmonary/chest: Effort normal Neurologic: Patient is alert Skin: Skin is warm Psychiatric: Patient has normal mood and affect  Ortho Exam: Orthopedic exam demonstrates reasonable cervical spine range of motion.  There is pain and coarseness with passive range of motion of both shoulders.  She has reasonable rotator cuff strength.  Motor sensory function to the hand is intact.  No nerve root tension signs.  She has good ankle dorsiflexion plantarflexion strength palpable pedal pulses.  No other masses lymph adenopathy or skin changes noted in the shoulder girdle region or in her upper back region.  She does have some pain with forward flexion.  Negative Babinski negative clonus.  Specialty Comments:  No specialty comments available.  Imaging: No  results found.   PMFS History: There are no active problems to display for this patient.  Past Medical History:  Diagnosis Date  . Anemia   . Aortic stenosis   . Arthritis   . Breast cancer ([place])   . Carpal tunnel syndrome   . Chronic pain   . Colon cancer ([place])   . Fibromyalgia   . Hematuria   . History of hip replacement    bilateral  . Hyperglycemia   . Hyperlipidemia   . Hypertension   . Lumbago   . Memory loss   . Osteoarthritis   . Osteopenia     Family History  Problem Relation Age of Onset  . Cancer Mother   . Heart disease Mother   . Cancer Sister   . Cancer Brother   . Cancer Sister   . Heart failure Brother   . Cancer Sister   . Heart failure Brother   . Hemachromatosis Son   . Diabetes Son     Past Surgical History:  Procedure Laterality Date  . ABDOMINAL HYSTERECTOMY  1958  . BREAST LUMPECTOMY  2008  . CHOLECYSTECTOMY  2002  . colon cancer tumor  1993   Dr. Jens Som  . hip replacements     2013 and 2014   Social History   Occupational History  . Not on file  Tobacco Use  . Smoking status: Former Smoker    Packs/day: 2.00    Years: 20.00    Pack years: 40.00  . Smokeless tobacco: Never Used  Substance and Sexual Activity  . Alcohol use: No  . Drug use: No  . Sexual activity: Not Currently

## 2017-11-26 ENCOUNTER — Encounter: Payer: Self-pay | Admitting: Physical Therapy

## 2017-11-26 ENCOUNTER — Telehealth (INDEPENDENT_AMBULATORY_CARE_PROVIDER_SITE_OTHER): Payer: Self-pay

## 2017-11-26 ENCOUNTER — Ambulatory Visit: Payer: Medicare HMO | Admitting: Physical Therapy

## 2017-11-26 DIAGNOSIS — M25551 Pain in right hip: Secondary | ICD-10-CM

## 2017-11-26 DIAGNOSIS — M25552 Pain in left hip: Secondary | ICD-10-CM | POA: Diagnosis not present

## 2017-11-26 DIAGNOSIS — G8929 Other chronic pain: Secondary | ICD-10-CM

## 2017-11-26 DIAGNOSIS — M62830 Muscle spasm of back: Secondary | ICD-10-CM

## 2017-11-26 DIAGNOSIS — R2689 Other abnormalities of gait and mobility: Secondary | ICD-10-CM | POA: Diagnosis not present

## 2017-11-26 DIAGNOSIS — M545 Low back pain: Secondary | ICD-10-CM | POA: Diagnosis not present

## 2017-11-26 NOTE — Telephone Encounter (Signed)
Patient's daughter Anne Ng called stating that PA needs to be redone due to having today's date on [place].  Needs to have appt.date which is 12/02/17.  Cb# is [phone].  Please advise.  Thank you.

## 2017-11-26 NOTE — Patient Instructions (Signed)
Gastroc / Heel Cord Stretch - Seated With Towel   Sit on floor, towel around ball of foot. Gently pull foot in toward body, stretching heel cord and calf. Hold for _30__ seconds. Repeat on involved leg. Repeat _3__ times. Do __2_ times per day.

## 2017-11-26 NOTE — Therapy (Addendum)
[place], [place], [address] Phone: [phone]   Fax:  [phone]  Physical Therapy Treatment / Discharge Summary  Patient Details  Name: Julie Walter MRN: [id number] Date of Birth: [birth year] Referring Provider: Gildardo Cranker, DO   Encounter Date: 11/26/2017  PT End of Session - 11/26/17 1105    Visit Number  7    Number of Visits  12    Date for PT Re-Evaluation  12/12/17    Authorization Type  MCR: Kx mod by 15th visit, progress note by 10th visit.     PT Start Time  1100    PT Stop Time  1140    PT Time Calculation (min)  40 min       Past Medical History:  Diagnosis Date  . Anemia   . Aortic stenosis   . Arthritis   . Breast cancer ([place])   . Carpal tunnel syndrome   . Chronic pain   . Colon cancer ([place])   . Fibromyalgia   . Hematuria   . History of hip replacement    bilateral  . Hyperglycemia   . Hyperlipidemia   . Hypertension   . Lumbago   . Memory loss   . Osteoarthritis   . Osteopenia     Past Surgical History:  Procedure Laterality Date  . ABDOMINAL HYSTERECTOMY  1958  . BREAST LUMPECTOMY  2008  . CHOLECYSTECTOMY  2002  . colon cancer tumor  1993   Dr. Jens Som  . hip replacements     2013 and 2014    There were no vitals filed for this visit.  Subjective Assessment - 11/26/17 1102    Subjective  Saw an orthopedist for the shoulder and they recommended an orthopedic Spine specialist on monday.     Currently in Pain?  Yes    Pain Score  3     Pain Location  Hip    Pain Orientation  Right;Left    Pain Descriptors / Indicators  -- pulling     Pain Radiating Towards  back of both knees     Aggravating Factors   certain movements, twist when getting up, getting up too fast     Pain Relieving Factors  meds, rest, laying on back          [facility] PT Assessment - 11/26/17 0001      Strength   Right Hip Flexion  4-/5    Right Hip ABduction  4/5    Left Hip  Flexion  4-/5    Left Hip ABduction  4/5                  [facility] Adult PT Treatment/Exercise - 11/26/17 0001      Lumbar Exercises: Stretches   Passive Hamstring Stretch  2 reps;30 seconds review of seated     Piriformis Stretch  3 reps;30 seconds review of seated       Lumbar Exercises: Supine   Other Supine Lumbar Exercises  supine posterior pelvic tilt 2 x 10 with 5 sec hold      Knee/Hip Exercises: Stretches   Passive Hamstring Stretch  2 reps;30 seconds bil, supine    Piriformis Stretch  2 reps;30 seconds review on supine    Gastroc Stretch  2 reps;20 seconds seated with towel       Knee/Hip Exercises: Supine   Bridges with Ball Squeeze  10 reps 5 sec with glute squeeze     Other Supine  Knee/Hip Exercises  clams 2 x 10 with red theraband      Knee/Hip Exercises: Sidelying   Hip ABduction  5 reps red band    Clams  x 10  each              PT Education - 11/26/17 1140    Education provided  Yes    Education Details  HEP    Person(s) Educated  Patient    Methods  Explanation;Handout    Comprehension  Verbalized understanding       PT Short Term Goals - 11/26/17 1108      PT SHORT TERM GOAL #1   Title  Pt. will be I with initial HEP.     Time  3    Period  Weeks    Status  Achieved      PT SHORT TERM GOAL #2   Title  Pt. will demonstrate proper sit-to-stand body mechanics to decrease stress on lumbar spine and LE for pain relief.     Baseline  able to demo in clinic today    Time  3    Period  Weeks    Status  Achieved      PT SHORT TERM GOAL #3   Title  Reduce N/T in RLE for >/= 3 days to demonstrate improvement and effectiveness of therapuetic intervention.    Baseline  none over the last few days     Time  3    Period  Weeks    Status  Achieved        PT Long Term Goals - 11/26/17 1110      PT LONG TERM GOAL #1   Title  Pt. will increase bilateral overall hip strength >/= 4/5 for LE stability to promote functional gait pattern.      Baseline  hip abduction 4/5 , hip flexion 4-/5    Time  6    Period  Weeks    Status  Partially Met      PT LONG TERM GOAL #2   Title  Pt. will demonstrate proper reciprocal gait pattern with </= 2/10 pain for increased ability for community ambulation.    Baseline  sometime hurts on first step, can walk for 10 minutes max, up to 7/10 pain     Time  6    Period  Weeks    Status  Not Met      PT LONG TERM GOAL #3   Title  Pt. will increase standing/walking tolerance >/= 30 minutes to resume recreational walking for achievement of pt. goal.     Baseline  10 min max    Time  6    Period  Weeks    Status  Not Met      PT LONG TERM GOAL #4   Title  Pt. will increase FOTO score and decrease limitations to </= 50% predicted score.     Baseline  59% , 58% limited on eval     Time  6    Period  Weeks    Status  Not Met            Plan - 11/26/17 1120    Clinical Impression Statement  Pt arrives with daughter and requests to discontinue PT due to lack of decrease in pain. She continues with difficulty standng and walking longer than 10 minutes, with pain up to 7/10 in bilateral hip and into posterior thighs. She will see orthopedic spine specialist next week. Reviewed  her HEP and added a calf stretch due to c/o cramping. Pain at end of session less than 1/10 and she declined modalities. Hip strength improved. Pt reports bed mobility and sit-stand are easier. She also reports less N/T in RLE. See goals met.     PT Next Visit Plan  discharge to HEP, plans to see specialist     PT Home Exercise Plan  sidelying clamshell, tennis ball self trigger point release, seated piriformis stretch, hamstring stretch, calf stretch     Consulted and Agree with Plan of Care  Patient    Family Member Consulted  Daughter       Patient will benefit from skilled therapeutic intervention in order to improve the following deficits and impairments:  Abnormal gait, Decreased activity tolerance, Improper body  mechanics, Increased muscle spasms, Difficulty walking, Decreased strength, Pain  Visit Diagnosis: Chronic bilateral low back pain, with sciatica presence unspecified  Other abnormalities of gait and mobility  Pain in left hip  Pain in right hip  Muscle spasm of back     Problem List There are no active problems to display for this patient.   Dorene Ar, [place] 11/26/2017, 12:18 PM  [place], [place], [address] Phone: [phone]   Fax:  [phone]  Name: Cariana Karge MRN: [id number] Date of Birth: 1919/09/27         PHYSICAL THERAPY DISCHARGE SUMMARY  Visits from Start of Care: 7  Current functional level related to goals / functional outcomes: See goals   Remaining deficits: Continued low back pain and LE pain that fluctuates depending on activity.   Education / Equipment: HEP, theraband, posture  Plan: Patient agrees to discharge.  Patient goals were partially met. Patient is being discharged due to meeting the stated rehab goals.  ?????          Kristoffer Leamon PT, DPT, LAT, ATC  12/18/17  11:29 AM

## 2017-11-27 NOTE — Telephone Encounter (Signed)
Not sure what she means about redoing the PA because of date? Is this like all the others and just an effective date range?

## 2017-11-27 NOTE — Telephone Encounter (Signed)
Patient's daughter notified.

## 2017-11-29 ENCOUNTER — Encounter: Payer: Medicare HMO | Admitting: Physical Therapy

## 2017-12-02 ENCOUNTER — Encounter (INDEPENDENT_AMBULATORY_CARE_PROVIDER_SITE_OTHER): Payer: Self-pay | Admitting: Physical Medicine and Rehabilitation

## 2017-12-02 ENCOUNTER — Ambulatory Visit (INDEPENDENT_AMBULATORY_CARE_PROVIDER_SITE_OTHER): Payer: Medicare HMO | Admitting: Physical Medicine and Rehabilitation

## 2017-12-02 ENCOUNTER — Ambulatory Visit (INDEPENDENT_AMBULATORY_CARE_PROVIDER_SITE_OTHER): Payer: Medicare HMO

## 2017-12-02 ENCOUNTER — Encounter: Payer: Medicare HMO | Admitting: Physical Therapy

## 2017-12-02 VITALS — BP 166/78 | HR 74 | Temp 97.5°F

## 2017-12-02 DIAGNOSIS — M5416 Radiculopathy, lumbar region: Secondary | ICD-10-CM | POA: Diagnosis not present

## 2017-12-02 MED ORDER — BETAMETHASONE SOD PHOS & ACET 6 (3-3) MG/ML IJ SUSP
12.0000 mg | Freq: Once | INTRAMUSCULAR | Status: AC
  Administered 2017-12-02: 12 mg

## 2017-12-02 NOTE — Patient Instructions (Signed)

## 2017-12-02 NOTE — Progress Notes (Deleted)
Pt states a knife piercing pain in both right and left hips that radiates to the right thigh, with occasional numbness in the left thigh. Pt states pain and numbness has been there since June 2018. Pt states she had acupuncture and PT that did not help. Pt states standing on her feet makes pain worse, heat, resting, stretching, and having her legs elevated makes pain better. +Driver, -BT, -Dye Allergies.

## 2017-12-03 NOTE — Procedures (Signed)
Julie Walter is a pleasant young appearing 84 year old female with chronic history of low back pain that refers into both hips and thighs and buttock region with consistent symptoms of neurogenic claudication.  She has been followed by her primary care physician Dr. Gildardo Cranker and an MRI of the lumbar spine was obtained and reviewed below and essentially shows pretty severe multifactorial stenosis at L4-5.  She is also been followed by Dr. Marlou Sa in our office for her orthopedic complaints and he suggested epidural injection for her back.  She has not had prior injections of the lumbar spine.  She reports worsening pain since June of last year.  It is more right thigh pain if it goes into the leg and some numbness in the left thigh.  She has had physical therapy and acupuncture but that has not helped.  She also reports taking OxyContin but really she is taking oxycodone but nonetheless this only helps to a degree.  She has had no focal weakness or other problems.  We are going to complete bilateral L4 transforaminal epidural steroid injection.  Lumbosacral Transforaminal Epidural Steroid Injection - Sub-Pedicular Approach with Fluoroscopic Guidance  Patient: Julie Walter      Date of Birth: 1919/03/15 MRN: [id number] PCP: Gildardo Cranker, DO      Visit Date: 12/02/2017   Universal Protocol:    Date/Time: 12/02/2017  Consent Given By: the patient  Position: PRONE  Additional Comments: Vital signs were monitored before and after the procedure. Patient was prepped and draped in the usual sterile fashion. The correct patient, procedure, and site was verified.   Injection Procedure Details:  Procedure Site One Meds Administered:  Meds ordered this encounter  Medications  . betamethasone acetate-betamethasone sodium phosphate (CELESTONE) injection 12 mg    Laterality: Bilateral  Location/Site:  L4-L5  Needle size: 22 G  Needle type: Spinal  Needle Placement:  Transforaminal  Findings:    -Comments: Excellent flow of contrast along the nerve and into the epidural space.  Delivering the injectate on the left caused some more pain on the left than right but this was fairly normal to to the stenotic nature of the foramen.  Procedure Details: After squaring off the end-plates to get a true AP view, the C-arm was positioned so that an oblique view of the foramen as noted above was visualized. The target area is just inferior to the "nose of the scotty dog" or sub pedicular. The soft tissues overlying this structure were infiltrated with 2-3 ml. of 1% Lidocaine without Epinephrine.  The spinal needle was inserted toward the target using a "trajectory" view along the fluoroscope beam.  Under AP and lateral visualization, the needle was advanced so it did not puncture dura and was located close the 6 O'Clock position of the pedical in AP tracterory. Biplanar projections were used to confirm position. Aspiration was confirmed to be negative for CSF and/or blood. A 1-2 ml. volume of Isovue-250 was injected and flow of contrast was noted at each level. Radiographs were obtained for documentation purposes.   After attaining the desired flow of contrast documented above, a 0.5 to 1.0 ml test dose of 0.25% Marcaine was injected into each respective transforaminal space.  The patient was observed for 90 seconds post injection.  After no sensory deficits were reported, and normal lower extremity motor function was noted,   the above injectate was administered so that equal amounts of the injectate were placed at each foramen (level) into the transforaminal epidural space.  Additional Comments:  The patient tolerated the procedure well No complications occurred Dressing: Band-Aid    Post-procedure details: Patient was observed during the procedure. Post-procedure instructions were reviewed.  Patient left the clinic in stable condition.   Pertinent Imaging: MRI  LUMBAR SPINE WITHOUT CONTRAST  TECHNIQUE: Multiplanar, multisequence MR imaging of the lumbar spine was performed. No intravenous contrast was administered.  COMPARISON:  None.  FINDINGS: Segmentation:  Normal  Alignment: Mild retrolisthesis L1-2 L2-3 L3-4 and L5-S1. Mild anterolisthesis L4-5  Vertebrae:  Negative for fracture or mass.  Conus medullaris and cauda equina: Conus extends to the L1 level. Conus and cauda equina appear normal.  Paraspinal and other soft tissues: Negative for mass or fluid collection  Disc levels:  T12-L1: Mild disc degeneration and mild facet degeneration without stenosis  L1-2: Advanced disc degeneration with disc space narrowing and endplate spurring. Mild spinal stenosis. Moderate subarticular stenosis bilaterally  L2-3: Moderate disc degeneration with diffuse endplate spurring and mild facet degeneration. Moderate subarticular stenosis on the right and mild subarticular stenosis on the left. Mild canal stenosis.  L3-4: Advanced disc degeneration with diffuse endplate spurring. Central disc protrusion. Bilateral facet hypertrophy with moderate to severe spinal stenosis. Subarticular and foraminal stenosis right greater than left.  L4-5: Severe spinal stenosis. Central disc protrusion with severe facet and ligamentum flavum hypertrophy. Subarticular stenosis bilaterally.  L5-S1: Moderate disc degeneration with diffuse endplate spurring. Mild subarticular and foraminal stenosis bilaterally. Mild facet degeneration bilaterally  IMPRESSION: Multilevel degenerative changes throughout the entire lumbar spine with spinal and foraminal stenosis at multiple levels as above  Moderate to severe spinal stenosis at L3-4 and severe spinal stenosis at L4-5.   Electronically Signed   By: Franchot Gallo M.D.   On: 10/29/2017 10:45

## 2017-12-04 ENCOUNTER — Encounter: Payer: Medicare HMO | Admitting: Physical Therapy

## 2017-12-10 ENCOUNTER — Encounter: Payer: Medicare HMO | Admitting: Physical Therapy

## 2017-12-12 ENCOUNTER — Encounter: Payer: Medicare HMO | Admitting: Physical Therapy

## 2017-12-16 ENCOUNTER — Other Ambulatory Visit: Payer: Self-pay | Admitting: *Deleted

## 2017-12-16 DIAGNOSIS — M4726 Other spondylosis with radiculopathy, lumbar region: Secondary | ICD-10-CM

## 2017-12-16 MED ORDER — OXYCODONE HCL 5 MG PO TABS: 5.0000 mg | ORAL_TABLET | ORAL | 0 refills | Status: DC | PRN

## 2017-12-16 NOTE — Telephone Encounter (Signed)
Patient daughter called and requested refill for patient's narcotic [place] Confirmed Pended Rx and sent to Dr. Eulas Post for approval.

## 2017-12-23 ENCOUNTER — Telehealth (INDEPENDENT_AMBULATORY_CARE_PROVIDER_SITE_OTHER): Payer: Self-pay | Admitting: Physical Medicine and Rehabilitation

## 2017-12-25 ENCOUNTER — Ambulatory Visit (INDEPENDENT_AMBULATORY_CARE_PROVIDER_SITE_OTHER): Payer: Medicare HMO

## 2017-12-25 VITALS — BP 140/68 | HR 80 | Temp 98.2°F | Ht <= 58 in | Wt 127.0 lb

## 2017-12-25 DIAGNOSIS — I10 Essential (primary) hypertension: Secondary | ICD-10-CM

## 2017-12-25 DIAGNOSIS — Z0000 Encounter for general adult medical examination without abnormal findings: Secondary | ICD-10-CM

## 2017-12-25 MED ORDER — ZOSTER VAC RECOMB ADJUVANTED 50 MCG/0.5ML IM SUSR: 0.5000 mL | Freq: Once | INTRAMUSCULAR | 1 refills | Status: AC

## 2017-12-25 MED ORDER — HYDROCHLOROTHIAZIDE 25 MG PO TABS: 12.5000 mg | ORAL_TABLET | Freq: Every day | ORAL | 1 refills | Status: DC

## 2017-12-25 NOTE — Progress Notes (Signed)
Subjective:   Julie Walter is a 84 y.o. female who presents for an Initial Medicare Annual Wellness Visit.        Objective:    Today's Vitals   12/25/17 1104  BP: 140/68  Pulse: 80  Temp: 98.2 F (36.8 C)  TempSrc: Oral  SpO2: 95%  Weight: 127 lb (57.6 kg)  Height: 4\' 10"  (1.473 m)   Body mass index is 26.54 kg/m.  Advanced Directives 12/25/2017 10/09/2017 09/25/2017  Does Patient Have a Medical Advance Directive? No Yes Yes  Type of Advance Directive - [place];Living will [place];Living will  Copy of [place] in Chart? - No - copy requested No - copy requested  Would patient like information on creating a medical advance directive? Yes (MAU/Ambulatory/Procedural Areas - Information given) - -    Current Medications (verified) Outpatient Encounter Medications as of 12/25/2017  Medication Sig  . diclofenac sodium (VOLTAREN) 1 % GEL Apply topically 2 (two) times daily.  Marland Kitchen docusate sodium (COLACE) 100 MG capsule Take 100 mg by mouth daily.  . hydrochlorothiazide (HYDRODIURIL) 25 MG tablet Take 0.5 tablets (12.5 mg total) by mouth daily.  . isosorbide mononitrate (IMDUR) 30 MG 24 hr tablet Take 1 tablet (30 mg total) daily by mouth.  . Lactobacillus (PROBIOTIC ACIDOPHILUS PO) Take 1 tablet by mouth every other day.  . Multiple Vitamin (MULTIVITAMIN WITH MINERALS) TABS tablet Take 1 tablet by mouth daily.  Marland Kitchen oxyCODONE (OXY IR/ROXICODONE) 5 MG immediate release tablet Take 1 tablet (5 mg total) by mouth every 4 (four) hours as needed for moderate pain or severe pain.  . polyethylene glycol (MIRALAX / GLYCOLAX) packet Take 17 g by mouth every other day.  . simvastatin (ZOCOR) 40 MG tablet Take 1 tablet (40 mg total) daily by mouth.  . Zoster Vaccine Adjuvanted [facility]) injection Inject 0.5 mLs into the muscle once for 1 dose.  . [DISCONTINUED] hydrochlorothiazide (HYDRODIURIL) 25 MG tablet Take 0.5 tablets  (12.5 mg total) daily by mouth.  . [DISCONTINUED] Zoster Vaccine Adjuvanted [facility]) injection Inject 0.5 mLs into the muscle once.   No facility-administered encounter medications on file as of 12/25/2017.     Allergies (verified) Patient has no known allergies.   History: Past Medical History:  Diagnosis Date  . Anemia   . Aortic stenosis   . Arthritis   . Breast cancer ([place])   . Carpal tunnel syndrome   . Chronic pain   . Colon cancer ([place])   . Fibromyalgia   . Hematuria   . History of hip replacement    bilateral  . Hyperglycemia   . Hyperlipidemia   . Hypertension   . Lumbago   . Memory loss   . Osteoarthritis   . Osteopenia    Past Surgical History:  Procedure Laterality Date  . ABDOMINAL HYSTERECTOMY  1958  . BREAST LUMPECTOMY  2008  . CHOLECYSTECTOMY  2002  . colon cancer tumor  1993   Dr. Jens Som  . hip replacements     2013 and 2014   Family History  Problem Relation Age of Onset  . Cancer Mother   . Heart disease Mother   . Cancer Sister   . Cancer Brother   . Cancer Sister   . Heart failure Brother   . Cancer Sister   . Heart failure Brother   . Hemachromatosis Son   . Diabetes Son    Social History   Socioeconomic History  . Marital  status: Widowed    Spouse name: None  . Number of children: None  . Years of education: None  . Highest education level: None  Social Needs  . Financial resource strain: Not hard at all  . Food insecurity - worry: Never true  . Food insecurity - inability: Never true  . Transportation needs - medical: No  . Transportation needs - non-medical: No  Occupational History  . None  Tobacco Use  . Smoking status: Former Smoker    Packs/day: 2.00    Years: 20.00    Pack years: 40.00  . Smokeless tobacco: Never Used  . Tobacco comment: quit 25 years ago  Substance and Sexual Activity  . Alcohol use: Yes    Comment: 1 glass of wine every couple weeks  . Drug use: No  . Sexual activity: Not Currently    Other Topics Concern  . None  Social History Narrative   Social History      Diet? light      Do you drink/eat things with caffeine? Coffee 1/day      Marital status?             widowed                       What year were you married? 1944      Do you live in a house, apartment, assisted living, condo, trailer, etc.? Independent living      Is it one or more stories? 3 stories      How many persons live in your home? 1      Do you have any pets in your home? (please list)  none      Highest level of education completed? High school + secretarial studies post      Current or past profession: Herbalist, Arts administrator      Do you exercise?                 yes                     Type & how often? Walking, back exercises      Advanced Directives      Do you have a living will? yes      Do you have a DNR form?       no                           If not, do you want to discuss one? yes      Do you have signed POA/HPOA for forms? yes      Functional Status      Do you have difficulty bathing or dressing yourself? no      Do you have difficulty preparing food or eating? no      Do you have difficulty managing your medications? no      Do you have difficulty managing your finances? no      Do you have difficulty affording your medications? no    Tobacco Counseling Counseling given: Not Answered Comment: quit 25 years ago   Clinical Intake:  Pre-visit preparation completed: No  Pain : No/denies pain     Nutritional Status: BMI 25 -29 Overweight Nutritional Risks: None Diabetes: No  How often do you need to have someone help you when you read instructions, pamphlets, or other written materials from your doctor or pharmacy?:  1 - Never What is the last grade level you completed in school?: some college  Interpreter Needed?: No  Information entered by :: Tyson Dense, RN   Activities of Daily Living In your present state of health, do you have  any difficulty performing the following activities: 12/25/2017  Hearing? Y  Vision? N  Difficulty concentrating or making decisions? N  Walking or climbing stairs? Y  Dressing or bathing? N  Doing errands, shopping? N  Preparing Food and eating ? N  Using the Toilet? N  In the past six months, have you accidently leaked urine? Y  Do you have problems with loss of bowel control? N  Managing your Medications? N  Managing your Finances? N  Housekeeping or managing your Housekeeping? N  Some recent data might be hidden     Immunizations and Health Maintenance Immunization History  Administered Date(s) Administered  . Influenza,inj,Quad PF,6+ Mos 08/30/2017  . Influenza-Unspecified 08/21/2016, 07/12/2017   Health Maintenance Due  Topic Date Due  . FOOT EXAM  12/10/1930  . OPHTHALMOLOGY EXAM  12/10/1930  . TETANUS/TDAP  12/11/1939  . DEXA SCAN  12/10/1985  . PNA vac Low Risk Adult (1 of 2 - PCV13) 12/10/1985    Patient Care Team: Gildardo Cranker, DO as PCP - General (Internal Medicine)  Indicate any recent Medical Services you may have received from other than [facility] providers in the past year (date may be approximate).     Assessment:   This is a routine wellness examination for [place].  Hearing/Vision screen Hearing Screening Comments: Bilateral hearing aids Vision Screening Comments: Sees eye doctor every 2 years  Dietary issues and exercise activities discussed: Current Exercise Habits: The patient does not participate in regular exercise at present, Exercise limited by: None identified  Goals    None     Depression Screen PHQ 2/9 Scores 12/25/2017  PHQ - 2 Score 4  PHQ- 9 Score 12    Fall Risk Fall Risk  12/25/2017  Falls in the past year? Yes  Number falls in past yr: 1  Injury with Fall? Yes  Comment hips    Is the patient's home free of loose throw rugs in walkways, pet beds, electrical cords, etc?   yes      Grab bars in the bathroom? yes       Handrails on the stairs?   yes      Adequate lighting?   yes  Timed Get Up and Go Performed 18 seconds, fall risk  Cognitive Function: MMSE - Mini Mental State Exam 12/25/2017  Orientation to time 5  Orientation to Place 4  Registration 3  Attention/ Calculation 5  Recall 2  Language- name 2 objects 2  Language- repeat 1  Language- follow 3 step command 3  Language- read & follow direction 1  Write a sentence 1  Copy design 1  Total score 28        Screening Tests Health Maintenance  Topic Date Due  . FOOT EXAM  12/10/1930  . OPHTHALMOLOGY EXAM  12/10/1930  . TETANUS/TDAP  12/11/1939  . DEXA SCAN  12/10/1985  . PNA vac Low Risk Adult (1 of 2 - PCV13) 12/10/1985  . HEMOGLOBIN A1C  04/07/2018  . URINE MICROALBUMIN  10/08/2018  . INFLUENZA VACCINE  Completed    Qualifies for Shingles Vaccine? Yes, educated and prescription sent to pharmacy  Cancer Screenings: Lung: Low Dose CT Chest recommended if Age 62-80 years, 30 pack-year currently smoking OR have quit w/in  15years. Patient does not qualify. Breast: Up to date on Mammogram? Yes   Up to date of Bone Density/Dexa? Yes Colorectal: up to date  Additional Screenings:  Hepatitis B/HIV/Syphillis: not indicated Hepatitis C Screening: declined     Plan:    I have personally reviewed and addressed the Medicare Annual Wellness questionnaire and have noted the following in the patient's chart:  A. Medical and social history B. Use of alcohol, tobacco or illicit drugs  C. Current medications and supplements D. Functional ability and status E.  Nutritional status F.  Physical activity G. Advance directives H. List of other physicians I.  Hospitalizations, surgeries, and ER visits in previous 12 months J.  [place] to include hearing, vision, cognitive, depression L. Referrals and appointments - none  In addition, I have reviewed and discussed with patient certain preventive protocols, quality metrics,  and best practice recommendations. A written personalized care plan for preventive services as well as general preventive health recommendations were provided to patient.  See attached scanned questionnaire for additional information.   Signed,   Tyson Dense, RN Nurse Health Advisor   Quick Notes   Health Maintenance: Pt is going to check with previous doctors office for hx of pna and tdap vaccines. Shingrix due, prescription sent to pharmacy. Last eye exam in Spring. DEXA done in [place] after age 41.     Abnormal Screen: PHQ-9:12 due to pain. MMSE 28/30, passed clock drawing     Patient Concerns: Pain in hips/back-would like to know next steps      Nurse Concerns: None

## 2017-12-25 NOTE — Patient Instructions (Signed)
Julie Walter , Thank you for taking time to come for your Medicare Wellness Visit. I appreciate your ongoing commitment to your health goals. Please review the following plan we discussed and let me know if I can assist you in the future.   Screening recommendations/referrals: Colonoscopy excluded, you are over age 82 Mammogram excluded, you are over age 66 Bone Density up to date Recommended yearly ophthalmology/optometry visit for glaucoma screening and checkup Recommended yearly dental visit for hygiene and checkup  Vaccinations: Influenza vaccine up to date, due 2019 fall season Pneumococcal vaccine please check with previous doctors office Tdap vaccine please check with previous doctors office Shingles vaccine due, prescription sent to pharmacy    Advanced directives: Advance directive discussed with you today. I have provided a copy for you to complete at home and have notarized. Once this is complete please bring a copy in to our office so we can scan it into your chart.  Conditions/risks identified: none  Next appointment: Julie Walter, 12/29/2018 @ 12:45pm   Preventive Care 65 Years and Older, Female Preventive care refers to lifestyle choices and visits with your health care provider that can promote health and wellness. What does preventive care include?  A yearly physical exam. This is also called an annual well check.  Dental exams once or twice a year.  Routine eye exams. Ask your health care provider how often you should have your eyes checked.  Personal lifestyle choices, including:  Daily care of your teeth and gums.  Regular physical activity.  Eating a healthy diet.  Avoiding tobacco and drug use.  Limiting alcohol use.  Practicing safe sex.  Taking low-dose aspirin every day.  Taking vitamin and mineral supplements as recommended by your health care provider. What happens during an annual well check? The services and screenings done by your health  care provider during your annual well check will depend on your age, overall health, lifestyle risk factors, and family history of disease. Counseling  Your health care provider may ask you questions about your:  Alcohol use.  Tobacco use.  Drug use.  Emotional well-being.  Home and relationship well-being.  Sexual activity.  Eating habits.  History of falls.  Memory and ability to understand (cognition).  Work and work Statistician.  Reproductive health. Screening  You may have the following tests or measurements:  Height, weight, and BMI.  Blood pressure.  Lipid and cholesterol levels. These may be checked every 5 years, or more frequently if you are over 49 years old.  Skin check.  Lung cancer screening. You may have this screening every year starting at age 39 if you have a 30-pack-year history of smoking and currently smoke or have quit within the past 15 years.  Fecal occult blood test (FOBT) of the stool. You may have this test every year starting at age 78.  Flexible sigmoidoscopy or colonoscopy. You may have a sigmoidoscopy every 5 years or a colonoscopy every 10 years starting at age 53.  Hepatitis C blood test.  Hepatitis B blood test.  Sexually transmitted disease (STD) testing.  Diabetes screening. This is done by checking your blood sugar (glucose) after you have not eaten for a while (fasting). You may have this done every 1-3 years.  Bone density scan. This is done to screen for osteoporosis. You may have this done starting at age 58.  Mammogram. This may be done every 1-2 years. Talk to your health care provider about how often you should have regular mammograms.  Talk with your health care provider about your test results, treatment options, and if necessary, the need for more tests. Vaccines  Your health care provider may recommend certain vaccines, such as:  Influenza vaccine. This is recommended every year.  Tetanus, diphtheria, and  acellular pertussis (Tdap, Td) vaccine. You may need a Td booster every 10 years.  Zoster vaccine. You may need this after age 73.  Pneumococcal 13-valent conjugate (PCV13) vaccine. One dose is recommended after age 100.  Pneumococcal polysaccharide (PPSV23) vaccine. One dose is recommended after age 66. Talk to your health care provider about which screenings and vaccines you need and how often you need them. This information is not intended to replace advice given to you by your health care provider. Make sure you discuss any questions you have with your health care provider. Document Released: 11/25/2015 Document Revised: 07/18/2016 Document Reviewed: 08/30/2015 Elsevier Interactive Patient Education  2017 [place] Prevention in the Home Falls can cause injuries. They can happen to people of all ages. There are many things you can do to make your home safe and to help prevent falls. What can I do on the outside of my home?  Regularly fix the edges of walkways and driveways and fix any cracks.  Remove anything that might make you trip as you walk through a door, such as a raised step or threshold.  Trim any bushes or trees on the path to your home.  Use bright outdoor lighting.  Clear any walking paths of anything that might make someone trip, such as rocks or tools.  Regularly check to see if handrails are loose or broken. Make sure that both sides of any steps have handrails.  Any raised decks and porches should have guardrails on the edges.  Have any leaves, snow, or ice cleared regularly.  Use sand or salt on walking paths during winter.  Clean up any spills in your garage right away. This includes oil or grease spills. What can I do in the bathroom?  Use night lights.  Install grab bars by the toilet and in the tub and shower. Do not use towel bars as grab bars.  Use non-skid mats or decals in the tub or shower.  If you need to sit down in the shower, use  a plastic, non-slip stool.  Keep the floor dry. Clean up any water that spills on the floor as soon as it happens.  Remove soap buildup in the tub or shower regularly.  Attach bath mats securely with double-sided non-slip rug tape.  Do not have throw rugs and other things on the floor that can make you trip. What can I do in the bedroom?  Use night lights.  Make sure that you have a light by your bed that is easy to reach.  Do not use any sheets or blankets that are too big for your bed. They should not hang down onto the floor.  Have a firm chair that has side arms. You can use this for support while you get dressed.  Do not have throw rugs and other things on the floor that can make you trip. What can I do in the kitchen?  Clean up any spills right away.  Avoid walking on wet floors.  Keep items that you use a lot in easy-to-reach places.  If you need to reach something above you, use a strong step stool that has a grab bar.  Keep electrical cords out of the way.  Do not use floor polish or wax that makes floors slippery. If you must use wax, use non-skid floor wax.  Do not have throw rugs and other things on the floor that can make you trip. What can I do with my stairs?  Do not leave any items on the stairs.  Make sure that there are handrails on both sides of the stairs and use them. Fix handrails that are broken or loose. Make sure that handrails are as long as the stairways.  Check any carpeting to make sure that it is firmly attached to the stairs. Fix any carpet that is loose or worn.  Avoid having throw rugs at the top or bottom of the stairs. If you do have throw rugs, attach them to the floor with carpet tape.  Make sure that you have a light switch at the top of the stairs and the bottom of the stairs. If you do not have them, ask someone to add them for you. What else can I do to help prevent falls?  Wear shoes that:  Do not have high heels.  Have  rubber bottoms.  Are comfortable and fit you well.  Are closed at the toe. Do not wear sandals.  If you use a stepladder:  Make sure that it is fully opened. Do not climb a closed stepladder.  Make sure that both sides of the stepladder are locked into place.  Ask someone to hold it for you, if possible.  Clearly mark and make sure that you can see:  Any grab bars or handrails.  First and last steps.  Where the edge of each step is.  Use tools that help you move around (mobility aids) if they are needed. These include:  Canes.  Walkers.  Scooters.  Crutches.  Turn on the lights when you go into a dark area. Replace any light bulbs as soon as they burn out.  Set up your furniture so you have a clear path. Avoid moving your furniture around.  If any of your floors are uneven, fix them.  If there are any pets around you, be aware of where they are.  Review your medicines with your doctor. Some medicines can make you feel dizzy. This can increase your chance of falling. Ask your doctor what other things that you can do to help prevent falls. This information is not intended to replace advice given to you by your health care provider. Make sure you discuss any questions you have with your health care provider. Document Released: 08/25/2009 Document Revised: 04/05/2016 Document Reviewed: 12/03/2014 Elsevier Interactive Patient Education  2017 [employer].

## 2017-12-25 NOTE — Telephone Encounter (Signed)
Left message for daughter to call back to discuss.

## 2017-12-25 NOTE — Telephone Encounter (Signed)
If helped a lot ut returned or helped 40 to 50% then ok to repeat, otherwise was do bilat L3 TF esi

## 2018-01-08 ENCOUNTER — Encounter: Payer: Medicare HMO | Admitting: Internal Medicine

## 2018-01-10 ENCOUNTER — Encounter: Payer: Self-pay | Admitting: Internal Medicine

## 2018-01-10 ENCOUNTER — Ambulatory Visit (INDEPENDENT_AMBULATORY_CARE_PROVIDER_SITE_OTHER): Payer: Medicare HMO | Admitting: Internal Medicine

## 2018-01-10 VITALS — BP 128/76 | HR 74 | Temp 97.9°F | Ht <= 58 in | Wt 127.0 lb

## 2018-01-10 DIAGNOSIS — I350 Nonrheumatic aortic (valve) stenosis: Secondary | ICD-10-CM | POA: Diagnosis not present

## 2018-01-10 DIAGNOSIS — Z0000 Encounter for general adult medical examination without abnormal findings: Secondary | ICD-10-CM | POA: Diagnosis not present

## 2018-01-10 DIAGNOSIS — I10 Essential (primary) hypertension: Secondary | ICD-10-CM | POA: Diagnosis not present

## 2018-01-10 DIAGNOSIS — G250 Essential tremor: Secondary | ICD-10-CM | POA: Diagnosis not present

## 2018-01-10 DIAGNOSIS — R300 Dysuria: Secondary | ICD-10-CM

## 2018-01-10 DIAGNOSIS — E119 Type 2 diabetes mellitus without complications: Secondary | ICD-10-CM

## 2018-01-10 DIAGNOSIS — M5432 Sciatica, left side: Secondary | ICD-10-CM | POA: Insufficient documentation

## 2018-01-10 DIAGNOSIS — E782 Mixed hyperlipidemia: Secondary | ICD-10-CM

## 2018-01-10 DIAGNOSIS — M2550 Pain in unspecified joint: Secondary | ICD-10-CM | POA: Diagnosis not present

## 2018-01-10 DIAGNOSIS — M5416 Radiculopathy, lumbar region: Secondary | ICD-10-CM | POA: Insufficient documentation

## 2018-01-10 MED ORDER — PROPRANOLOL HCL 10 MG PO TABS: 10.0000 mg | ORAL_TABLET | Freq: Two times a day (BID) | ORAL | 3 refills | Status: DC

## 2018-01-10 MED ORDER — TETANUS-DIPHTH-ACELL PERTUSSIS 5-2.5-18.5 LF-MCG/0.5 IM SUSP: 0.5000 mL | Freq: Once | INTRAMUSCULAR | 0 refills | Status: AC

## 2018-01-10 MED ORDER — DICLOFENAC SODIUM 1 % TD GEL: TRANSDERMAL | 1 refills | Status: DC

## 2018-01-10 NOTE — Patient Instructions (Addendum)
Recommend weighted utensils to assist in eating meals  START PROPANOLOL 10MG  2 TIMES DAILY FOR TREMOR. Check pulse/heart rate at home prior to each dose. Hold medicine if pulse/heart rate is < 60  Continue other medications as ordered  Will call with lab results  Follow up in 3 mos for AS, HTN, ET, DM, hyperlipidemia.

## 2018-01-10 NOTE — Progress Notes (Signed)
Patient ID: Julie Walter, female   DOB: [birth year], 84 y.o.   MRN: [id number]   Location:  [facility]  Place of Service:  OFFICE  Provider: Arletha Grippe, DO  Patient Care Team: Gildardo Cranker, DO as PCP - General (Internal Medicine)  Extended Emergency Contact Information Primary Emergency Contact: Kniskern-Lineberger,Annette Address: [address]          [place], [place] [address] Johnnette Litter of [place] Phone: [phone] Relation: Daughter  Code Status:  Goals of Care: Advanced Directive information Advanced Directives 12/25/2017  Does Patient Have a Medical Advance Directive? No  Type of Advance Directive -  Copy of [place] in Chart? -  Would patient like information on creating a medical advance directive? Yes (MAU/Ambulatory/Procedural Areas - Information given)     Chief Complaint  Patient presents with  . Annual Exam    Yearly check-up, AWV completed 12/25/17, MMSE 28/30, and EKG. Patient would like to review medications.   . Medication Refill    Voltaren   . Constipation    Discuss alternatives for constipation, paitent taking several medications with mimial relief     HPI: Patient is a 84 y.o. female seen in today for comprehensive exam. She completed AWV 12/25/17 - note reviewed. MMSE 28/30. She is c/a constipation. She takes probiotic at night, miralax in the AM, hot water and powder pudding work to relieve constipation. She has to take a laxative at least 2 times per week.   She does have "a little dizziness" in the AM that resolves on its own and associated with feeling of being off balanced.   She reports increased urinary frequency, abdominal discomfort and dysuria x several days. No N/V. She takes diuretic daily.  DM - diet controlled. A1c 6.2%. Urine microalbumin/Cr ratio 12  Hyperlipidemia - stable on simvastatin. LDL 84; HDL 55  HTN - stable on HCTZ. She has increased urinary frequency at least 6x/per  night  Hemoconcentration - stable. H/H 15/44.4. She was told it was due to dehydration  Severe AS - 2D echo EF 60-65% with critical AS. Surgery recommended in Spring 2018 but she declined due to c/a severe joint pain. She has dizziness but no SOB or CP. She takes imdur.  Urinary incontinance - she has leakage, urge related. vesicare was ineffective in the past and caused inability to urinate, dry mouth. She has never tried [employer].  Chronic left sciatic pain - improved s/p epidural. she tried 6 tx of accupuncture without relief this yr. It resolved after 1 month last year. She has not tried PT (she has b/l THR and had PT then). She reports hx scoliosis.   Chronic joint pain/lumbar radiculopathy - she rec'd epidural in lumbar spine in Jan 2019 by Dr Ernestina Patches. She takes OxyIR 1 tab per day. She takes Aleve prn. She was followed by Ortho in [facility]. She has tried hydrocodone in the past and found it to be more effective than oxycodone. She uses voltaren gel prn.  Depression screen PHQ 2/9 12/25/2017  Decreased Interest 2  Down, Depressed, Hopeless 2  PHQ - 2 Score 4  Altered sleeping 3  Tired, decreased energy 2  Change in appetite 1  Feeling bad or failure about yourself  2  Trouble concentrating 0  Moving slowly or fidgety/restless 0  Suicidal thoughts 0  PHQ-9 Score 12  Difficult doing work/chores Very difficult    Fall Risk  12/25/2017  Falls in the past year? Yes  Number falls in past yr:  1  Injury with Fall? Yes  Comment hips   MMSE - Mini Mental State Exam 12/25/2017  Orientation to time 5  Orientation to Place 4  Registration 3  Attention/ Calculation 5  Recall 2  Language- name 2 objects 2  Language- repeat 1  Language- follow 3 step command 3  Language- read & follow direction 1  Write a sentence 1  Copy design 1  Total score 28     Health Maintenance  Topic Date Due  . FOOT EXAM  12/10/1930  . OPHTHALMOLOGY EXAM  12/10/1930  . TETANUS/TDAP  12/11/1939  . DEXA SCAN   12/10/1985  . PNA vac Low Risk Adult (1 of 2 - PCV13) 12/10/1985  . HEMOGLOBIN A1C  04/07/2018  . URINE MICROALBUMIN  10/08/2018  . INFLUENZA VACCINE  Completed     Past Medical History:  Diagnosis Date  . Anemia   . Aortic stenosis   . Arthritis   . Breast cancer ([place])   . Carpal tunnel syndrome   . Chronic pain   . Colon cancer ([place])   . Fibromyalgia   . Hematuria   . History of hip replacement    bilateral  . Hyperglycemia   . Hyperlipidemia   . Hypertension   . Lumbago   . Memory loss   . Osteoarthritis   . Osteopenia     Past Surgical History:  Procedure Laterality Date  . ABDOMINAL HYSTERECTOMY  1958  . BREAST LUMPECTOMY  2008  . CHOLECYSTECTOMY  2002  . colon cancer tumor  1993   Dr. Jens Som  . hip replacements     2013 and 2014    Family History  Problem Relation Age of Onset  . Cancer Mother   . Heart disease Mother   . Cancer Sister   . Cancer Brother   . Cancer Sister   . Heart failure Brother   . Cancer Sister   . Heart failure Brother   . Hemachromatosis Son   . Diabetes Son    Family Status  Relation Name Status  . Mother Audrea Muscat Deceased  . Father Deidre Ala Deceased  . Sister Barnett Applebaum Deceased  . Brother [place] Deceased  . Sister Rod Holler Deceased  . Brother [employer] Deceased  . Sister Berenice Primas Deceased  . Brother Rica Mote Deceased  . Son [place]  . Daughter Benna Dunks    Social History   Socioeconomic History  . Marital status: Widowed    Spouse name: Not on file  . Number of children: Not on file  . Years of education: Not on file  . Highest education level: Not on file  Social Needs  . Financial resource strain: Not hard at all  . Food insecurity - worry: Never true  . Food insecurity - inability: Never true  . Transportation needs - medical: No  . Transportation needs - non-medical: No  Occupational History  . Not on file  Tobacco Use  . Smoking status: Former Smoker    Packs/day: 2.00    Years: 20.00    Pack years:  40.00  . Smokeless tobacco: Never Used  . Tobacco comment: quit 25 years ago  Substance and Sexual Activity  . Alcohol use: Yes    Comment: 1 glass of wine every couple weeks  . Drug use: No  . Sexual activity: Not Currently  Other Topics Concern  . Not on file  Social History Narrative   Social History      Diet? light  Do you drink/eat things with caffeine? Coffee 1/day      Marital status?             widowed                       What year were you married? 1944      Do you live in a house, apartment, assisted living, condo, trailer, etc.? Independent living      Is it one or more stories? 3 stories      How many persons live in your home? 1      Do you have any pets in your home? (please list)  none      Highest level of education completed? High school + secretarial studies post      Current or past profession: Herbalist, Arts administrator      Do you exercise?                 yes                     Type & how often? Walking, back exercises      Advanced Directives      Do you have a living will? yes      Do you have a DNR form?       no                           If not, do you want to discuss one? yes      Do you have signed POA/HPOA for forms? yes      Functional Status      Do you have difficulty bathing or dressing yourself? no      Do you have difficulty preparing food or eating? no      Do you have difficulty managing your medications? no      Do you have difficulty managing your finances? no      Do you have difficulty affording your medications? no    No Known Allergies  Allergies as of 01/10/2018   No Known Allergies     Medication List        Accurate as of 01/10/18  1:27 PM. Always use your most recent med list.          AMBULATORY NON FORMULARY MEDICATION Medication Name: Power pudding daily as needed for constipation   diclofenac sodium 1 % Gel Commonly known as:  VOLTAREN Apply topically 2 (two) times daily.     docusate sodium 100 MG capsule Commonly known as:  COLACE Take 100 mg by mouth daily.   hydrochlorothiazide 25 MG tablet Commonly known as:  HYDRODIURIL Take 0.5 tablets (12.5 mg total) by mouth daily.   isosorbide mononitrate 30 MG 24 hr tablet Commonly known as:  IMDUR Take 1 tablet (30 mg total) daily by mouth.   LAXATIVE PO Take by mouth daily as needed.   multivitamin with minerals Tabs tablet Take 1 tablet by mouth daily.   oxyCODONE 5 MG immediate release tablet Commonly known as:  Oxy IR/ROXICODONE Take 1 tablet (5 mg total) by mouth every 4 (four) hours as needed for moderate pain or severe pain.   polyethylene glycol packet Commonly known as:  MIRALAX / GLYCOLAX Take 17 g by mouth every other day.   PROBIOTIC ACIDOPHILUS PO Take 1 tablet by mouth every other day.   simvastatin 40 MG tablet  Commonly known as:  ZOCOR Take 1 tablet (40 mg total) daily by mouth.   Tdap 5-2.5-18.5 LF-MCG/0.5 injection Commonly known as:  BOOSTRIX Inject 0.5 mLs into the muscle once for 1 dose.        Review of Systems:  Review of Systems  HENT: Positive for hearing loss (wears hearing aids).   Genitourinary: Positive for dysuria and frequency.  Musculoskeletal: Positive for arthralgias and back pain.  Neurological: Positive for numbness.  All other systems reviewed and are negative.   Physical Exam: Vitals:   01/10/18 1241  BP: 128/76  Pulse: 74  Temp: 97.9 F (36.6 C)  TempSrc: Oral  SpO2: 93%  Weight: 127 lb (57.6 kg)  Height: _0  (1.473 m)   Body mass index is 26.54 kg/m. Physical Exam  Constitutional: She is oriented to person, place, and time. She appears well-developed and well-nourished. No distress.  HENT:  Head: Normocephalic and atraumatic.  Right Ear: External ear normal.  Left Ear: External ear normal.  Mouth/Throat: Oropharynx is clear and moist. No oropharyngeal exudate.  MMM; no oral thrush  Eyes: EOM are normal. Pupils are equal, round,  and reactive to light. No scleral icterus.  Neck: Normal range of motion. Neck supple. Carotid bruit is present (b/l systolic from chest). No tracheal deviation present. No thyromegaly present.  Cardiovascular: Normal rate, regular rhythm and intact distal pulses. Exam reveals no gallop and no friction rub.  Murmur (3/6 SEM-->carotid b/l) heard. No LE edema b/l. No calf TTP  Pulmonary/Chest: Effort normal and breath sounds normal. No respiratory distress. She has no wheezes. She has no rhonchi. She has no rales. She exhibits no tenderness. Right breast exhibits no inverted nipple, no mass, no nipple discharge, no skin change and no tenderness. Left breast exhibits no inverted nipple, no mass, no nipple discharge, no skin change and no tenderness. Breasts are symmetrical.  No rhonchi  Abdominal: Soft. Bowel sounds are normal. She exhibits no distension and no mass. There is no hepatosplenomegaly or hepatomegaly. There is tenderness (suprapubic). There is no rebound and no guarding. No hernia.  Genitourinary:  Genitourinary Comments: Deferred due to age; no GYN c/o  Musculoskeletal: She exhibits edema, tenderness and deformity (small and large joint).  Lymphadenopathy:    She has no cervical adenopathy.  Neurological: She is alert and oriented to person, place, and time. She has normal reflexes.  Skin: Skin is warm and dry. No rash noted.  Psychiatric: She has a normal mood and affect. Her behavior is normal. Judgment and thought content normal.  Vitals reviewed.  Diabetic Foot Exam - Simple   Simple Foot Form Diabetic Foot exam was performed with the following findings:  Yes 01/10/2018  2:25 PM  Visual Inspection See comments:  Yes Sensation Testing Intact to touch and monofilament testing bilaterally:  Yes Pulse Check Posterior Tibialis and Dorsalis pulse intact bilaterally:  Yes Comments B/l hammertoes with dorsal calluses; no ulcerations      Labs reviewed:  Basic Metabolic  Panel: Recent Labs    07/12/17 10/08/17 0956  NA 146 142  K 4.0 3.9  CL  --  101  CO2  --  32  GLUCOSE  --  128*  BUN 16 20  CREATININE 0.8 0.94*  CALCIUM  --  9.8   Liver Function Tests: Recent Labs    10/08/17 0956  AST 18  ALT 9  BILITOT 1.0  PROT 6.4   No results for input(s): LIPASE, AMYLASE in the last 8760 hours. No  results for input(s): AMMONIA in the last 8760 hours. CBC: Recent Labs    07/12/17 10/08/17 0956  WBC 7.2 6.5  NEUTROABS 5 4,303  HGB 16.2* 15.0  HCT 47* 44.4  MCV  --  93.7  PLT 145* 135*   Lipid Panel: Recent Labs    07/12/17 10/08/17 0956  CHOL 185 163  HDL 53 55  LDLCALC 93  --   TRIG 182* 141  CHOLHDL  --  3.0   Lab Results  Component Value Date   HGBA1C 6.2 (H) 10/08/2017    Procedures: No results found. ECG OBTAINED AND REVIEWED BY MYSELF: NSR @ 60 bpm, PAC, LAD, LAE, poor R wave progression. No acute ischemic changes. No other ECG available to compare.  Assessment/Plan   ICD-10-CM   1. Well adult exam Z00.00   2. Essential hypertension I10 EKG 12-Lead    CMP with eGFR([facility])  3. Mixed hyperlipidemia E78.2 EKG 12-Lead    Lipid Panel  4. Multiple joint pain M25.50 diclofenac sodium (VOLTAREN) 1 % GEL  5. Tremor, essential G25.0 propranolol (INDERAL) 10 MG tablet  6. Aortic stenosis, severe I35.0   7. Type 2 diabetes mellitus without complication, without long-term current use of insulin (HCC) E11.9 CMP with eGFR([facility])    Hemoglobin A1c  8. Dysuria R30.0 Urinalysis with Reflex Microscopic    Culture, Urine   Recommend weighted utensils to assist in eating meals  START PROPANOLOL 10MG 2 TIMES DAILY FOR TREMOR. Check pulse/heart rate at home prior to each dose. Hold medicine if pulse/heart rate is < 60  Continue other medications as ordered  Will call with lab results  Will check TSH next OV  Follow up in 3 mos for AS, HTN, ET, DM, hyperlipidemia.    Nyella Eckels S. Perlie Gold  [facility]  and Adult Medicine [address] [place], [place] [address] [phone] Cell (Monday-Friday 8 AM - 5 PM) [phone] After 5 PM and follow prompts

## 2018-01-11 LAB — LIPID PANEL
CHOL/HDL RATIO: 3.3 (calc) (ref <5.0)
Cholesterol: 176 mg/dL (ref <200)
HDL: 53 mg/dL (ref >50)
LDL Cholesterol (Calc): 93 mg/dL (calc)
NON-HDL CHOLESTEROL (CALC): 123 mg/dL (ref <130)
Triglycerides: 204 mg/dL — ABNORMAL HIGH (ref <150)

## 2018-01-11 LAB — URINALYSIS, ROUTINE W REFLEX MICROSCOPIC
Bilirubin Urine: NEGATIVE
Glucose, UA: NEGATIVE
HGB URINE DIPSTICK: NEGATIVE
Ketones, ur: NEGATIVE
Leukocytes, UA: NEGATIVE
NITRITE: NEGATIVE
PH: 6 (ref 5.0–8.0)
PROTEIN: NEGATIVE
Specific Gravity, Urine: 1.01 (ref 1.001–1.03)

## 2018-01-11 LAB — URINE CULTURE
MICRO NUMBER: 90269880
SPECIMEN QUALITY: ADEQUATE

## 2018-01-11 LAB — COMPLETE METABOLIC PANEL WITH GFR
AG Ratio: 2.2 (calc) (ref 1.0–2.5)
ALBUMIN MSPROF: 4.3 g/dL (ref 3.6–5.1)
ALKALINE PHOSPHATASE (APISO): 60 U/L (ref 33–130)
ALT: 11 U/L (ref 6–29)
AST: 15 U/L (ref 10–35)
BUN: 18 mg/dL (ref 7–25)
CO2: 32 mmol/L (ref 20–32)
CREATININE: 0.73 mg/dL (ref 0.60–0.88)
Calcium: 9.9 mg/dL (ref 8.6–10.4)
Chloride: 101 mmol/L (ref 98–110)
GFR, EST NON AFRICAN AMERICAN: 69 mL/min/{1.73_m2} (ref >=60)
GFR, Est African American: 80 mL/min/{1.73_m2} (ref >=60)
GLOBULIN: 2 g/dL (ref 1.9–3.7)
Glucose, Bld: 116 mg/dL (ref 65–139)
Potassium: 4.4 mmol/L (ref 3.5–5.3)
SODIUM: 142 mmol/L (ref 135–146)
Total Bilirubin: 0.8 mg/dL (ref 0.2–1.2)
Total Protein: 6.3 g/dL (ref 6.1–8.1)

## 2018-01-11 LAB — HEMOGLOBIN A1C
Hgb A1c MFr Bld: 6.2 % of total Hgb — ABNORMAL HIGH (ref <5.7)
MEAN PLASMA GLUCOSE: 131 (calc)
eAG (mmol/L): 7.3 (calc)

## 2018-01-14 NOTE — Telephone Encounter (Signed)
Call has not been returned. Closing encounter and will await call back.

## 2018-01-16 ENCOUNTER — Telehealth: Payer: Self-pay | Admitting: *Deleted

## 2018-01-16 NOTE — Telephone Encounter (Signed)
Received Prior Authorization from [employer] [id number] for Diclofenac. Initiated through [facility]. Went into review 48-72 hours turn around. Awaiting determination. [id number]

## 2018-01-17 ENCOUNTER — Telehealth: Payer: Self-pay

## 2018-01-17 NOTE — Telephone Encounter (Signed)
Incoming fax received from [employer] indicating Diclofenac Sodium approved 11/10/18-11/10/18  Approval Letter was faxed to patient's pharmacy- [facility]

## 2018-02-17 ENCOUNTER — Ambulatory Visit (INDEPENDENT_AMBULATORY_CARE_PROVIDER_SITE_OTHER): Payer: Medicare HMO | Admitting: Orthopedic Surgery

## 2018-02-17 ENCOUNTER — Encounter (INDEPENDENT_AMBULATORY_CARE_PROVIDER_SITE_OTHER): Payer: Self-pay | Admitting: Orthopedic Surgery

## 2018-02-17 DIAGNOSIS — M19012 Primary osteoarthritis, left shoulder: Principal | ICD-10-CM

## 2018-02-17 DIAGNOSIS — M19011 Primary osteoarthritis, right shoulder: Secondary | ICD-10-CM

## 2018-02-20 ENCOUNTER — Encounter (INDEPENDENT_AMBULATORY_CARE_PROVIDER_SITE_OTHER): Payer: Self-pay | Admitting: Orthopedic Surgery

## 2018-02-20 DIAGNOSIS — M19011 Primary osteoarthritis, right shoulder: Secondary | ICD-10-CM | POA: Diagnosis not present

## 2018-02-20 DIAGNOSIS — M19012 Primary osteoarthritis, left shoulder: Secondary | ICD-10-CM | POA: Diagnosis not present

## 2018-02-20 MED ORDER — LIDOCAINE HCL 1 % IJ SOLN
5.0000 mL | INTRAMUSCULAR | Status: AC | PRN
  Administered 2018-02-20: 5 mL

## 2018-02-20 MED ORDER — BUPIVACAINE HCL 0.5 % IJ SOLN
9.0000 mL | INTRAMUSCULAR | Status: AC | PRN
  Administered 2018-02-20: 9 mL via INTRA_ARTICULAR

## 2018-02-20 MED ORDER — METHYLPREDNISOLONE ACETATE 40 MG/ML IJ SUSP
40.0000 mg | INTRAMUSCULAR | Status: AC | PRN
Start: 2018-02-20 — End: 2018-02-20
  Administered 2018-02-20: 40 mg via INTRA_ARTICULAR

## 2018-02-20 NOTE — Progress Notes (Signed)
Office Visit Note   Patient: Julie Walter           Date of Birth: [birth year]           MRN: [id number] Visit Date: 02/17/2018 Requested by: Gildardo Cranker, [address], [place] [id number] PCP: Gildardo Cranker, DO  Subjective: Chief Complaint  Patient presents with  . Right Shoulder - Pain    HPI: Julie Walter is a 84 year old patient with bilateral shoulder arthritis.  She had an injection done 11/18/2017.  Did well with that injection.  Health until a month ago.  Requesting another injection of the right shoulder today.  Has had no interval injury.  Does take some over-the-counter medication for the problem.              ROS: All systems reviewed are negative as they relate to the chief complaint within the history of present illness.  Patient denies  fevers or chills.   Assessment & Plan: Visit Diagnoses:  1. Bilateral shoulder region arthritis     Plan: Impression is right shoulder arthritis.  Plan plan is shoulder joint injection today to help with the arthritis.  I will see her back as needed.  Follow-Up Instructions: Return if symptoms worsen or fail to improve.   Orders:  No orders of the defined types were placed in this encounter.  No orders of the defined types were placed in this encounter.     Procedures: Large Joint Inj: R glenohumeral on 02/20/2018 5:39 PM Indications: diagnostic evaluation and pain Details: 18 G 1.5 in needle, posterior approach  Arthrogram: No  Medications: 9 mL bupivacaine 0.5 %; 40 mg methylPREDNISolone acetate 40 MG/ML; 5 mL lidocaine 1 % Outcome: tolerated well, no immediate complications Procedure, treatment alternatives, risks and benefits explained, specific risks discussed. Consent was given by the patient. Immediately prior to procedure a time out was called to verify the correct patient, procedure, equipment, support staff and site/side marked as required. Patient was prepped and draped in the usual sterile fashion.         Clinical Data: No additional findings.  Objective: Vital Signs: There were no vitals taken for this visit.  Physical Exam:   Constitutional: Patient appears well-developed HEENT:  Head: Normocephalic Eyes:EOM are normal Neck: Normal range of motion Cardiovascular: Normal rate Pulmonary/chest: Effort normal Neurologic: Patient is alert Skin: Skin is warm Psychiatric: Patient has normal mood and affect    Ortho Exam: Orthopedic exam demonstrates some coarseness with passive and active right shoulder range of motion.  She has reasonable rotator cuff strength but limited forward flexion and abduction below 90 degrees.  Motor sensory function to the hand is intact  Specialty Comments:  No specialty comments available.  Imaging: No results found.   PMFS History: Patient Active Problem List   Diagnosis Date Noted  . Essential hypertension 01/10/2018  . Mixed hyperlipidemia 01/10/2018  . Multiple joint pain 01/10/2018  . Tremor, essential 01/10/2018  . Left sided sciatica 01/10/2018  . Lumbar radiculopathy 01/10/2018  . Aortic stenosis, severe 01/10/2018  . Type 2 diabetes mellitus without complication, without long-term current use of insulin ([place]) 01/10/2018   Past Medical History:  Diagnosis Date  . Anemia   . Aortic stenosis   . Arthritis   . Breast cancer ([name])   . Carpal tunnel syndrome   . Chronic pain   . Colon cancer ([name])   . Fibromyalgia   . Hematuria   . History of hip replacement  bilateral  . Hyperglycemia   . Hyperlipidemia   . Hypertension   . Lumbago   . Memory loss   . Osteoarthritis   . Osteopenia     Family History  Problem Relation Age of Onset  . Cancer Mother   . Heart disease Mother   . Cancer Sister   . Cancer Brother   . Cancer Sister   . Heart failure Brother   . Cancer Sister   . Heart failure Brother   . Hemachromatosis Son   . Diabetes Son     Past Surgical History:  Procedure Laterality Date  .  ABDOMINAL HYSTERECTOMY  1958  . BREAST LUMPECTOMY  2008  . CHOLECYSTECTOMY  2002  . colon cancer tumor  1993   Dr. Jens Som  . hip replacements     2013 and 2014   Social History   Occupational History  . Not on file  Tobacco Use  . Smoking status: Former Smoker    Packs/day: 2.00    Years: 20.00    Pack years: 40.00  . Smokeless tobacco: Never Used  . Tobacco comment: quit 25 years ago  Substance and Sexual Activity  . Alcohol use: Yes    Comment: 1 glass of wine every couple weeks  . Drug use: No  . Sexual activity: Not Currently

## 2018-02-25 DIAGNOSIS — R69 Illness, unspecified: Secondary | ICD-10-CM | POA: Diagnosis not present

## 2018-02-26 ENCOUNTER — Telehealth: Payer: Self-pay

## 2018-02-26 NOTE — Telephone Encounter (Signed)
Patient's daughter called to ask for Dr.Carter's expert advice on a recommendation from the Dentist to have oral Surgery.   Patient seen her dentist yesterday and has a tooth that is broken off and a tooth that is deteriorating . The Dentist has scheduled an appointment for next Tuesday 03/04/18 with oral surgeon Laurena Slimmer. Patient's daughter is concerned if patient is a candidate for oral surgery with her diagnosis of Aortic Stenosis.   Last OV: 01/10/18  Pending OV 04/23/18  Please advise

## 2018-02-27 NOTE — Telephone Encounter (Signed)
Not getting procedure as recommended can cause bacteria build up on her heart valves. It depends on what anesthesia the oral surgeon recommends. She may need cardiac clearance

## 2018-02-27 NOTE — Telephone Encounter (Signed)
Spoke with Julie Walter, patient will have consultation on Tuesday. Julie Walter will know more about anesthesia recommendation from [place] at that time. Julie Walter will call back on Tuesday or have Dr.Miller call.

## 2018-03-04 DIAGNOSIS — R69 Illness, unspecified: Secondary | ICD-10-CM | POA: Diagnosis not present

## 2018-03-05 ENCOUNTER — Telehealth: Payer: Self-pay

## 2018-03-05 NOTE — Telephone Encounter (Signed)
Annette aware of Dr.Carter's response.  Incoming fax received from [place] office, placed in Dr.Carter's review and sign folder, then to scanning.

## 2018-03-05 NOTE — Telephone Encounter (Signed)
No she does not need cardiac clearance - ok to proceed with dental procedure as scheduled

## 2018-03-05 NOTE — Telephone Encounter (Signed)
Patient's daughter Anne Ng called with a follow-up from oral surgeon Dr.Joseph Sabra Heck.   Dr.Miller has scheduled patient for surgery on 03/21/18, Dr.Miller will use a local anesthetic.  Anne Ng would like to know if Dr.Carter thinks patient will need cardiac clearance as mentioned in phone note dated 02/26/18.  I called Dr.Miller's office @ [phone], spoke with Barnett Applebaum. Barnett Applebaum will send Dr.Miller's note to my attention

## 2018-03-28 ENCOUNTER — Encounter (HOSPITAL_COMMUNITY): Payer: Self-pay | Admitting: Emergency Medicine

## 2018-03-28 ENCOUNTER — Emergency Department (HOSPITAL_COMMUNITY)
Admission: EM | Admit: 2018-03-28 | Discharge: 2018-03-28 | Disposition: A | Discharge status: Home / Self Care | Payer: Medicare HMO | Attending: Emergency Medicine | Admitting: Emergency Medicine

## 2018-03-28 ENCOUNTER — Other Ambulatory Visit: Payer: Self-pay

## 2018-03-28 DIAGNOSIS — R5383 Other fatigue: Secondary | ICD-10-CM | POA: Diagnosis not present

## 2018-03-28 DIAGNOSIS — Z96643 Presence of artificial hip joint, bilateral: Secondary | ICD-10-CM | POA: Insufficient documentation

## 2018-03-28 DIAGNOSIS — E119 Type 2 diabetes mellitus without complications: Secondary | ICD-10-CM | POA: Diagnosis not present

## 2018-03-28 DIAGNOSIS — I10 Essential (primary) hypertension: Secondary | ICD-10-CM | POA: Diagnosis not present

## 2018-03-28 DIAGNOSIS — E860 Dehydration: Secondary | ICD-10-CM | POA: Diagnosis not present

## 2018-03-28 DIAGNOSIS — Z85038 Personal history of other malignant neoplasm of large intestine: Secondary | ICD-10-CM | POA: Diagnosis not present

## 2018-03-28 DIAGNOSIS — Z79899 Other long term (current) drug therapy: Secondary | ICD-10-CM | POA: Insufficient documentation

## 2018-03-28 DIAGNOSIS — R5381 Other malaise: Secondary | ICD-10-CM | POA: Insufficient documentation

## 2018-03-28 DIAGNOSIS — Z853 Personal history of malignant neoplasm of breast: Secondary | ICD-10-CM | POA: Insufficient documentation

## 2018-03-28 DIAGNOSIS — R531 Weakness: Secondary | ICD-10-CM | POA: Diagnosis not present

## 2018-03-28 LAB — CBC
HCT: 45.2 % (ref 36.0–46.0)
Hemoglobin: 15.4 g/dL — ABNORMAL HIGH (ref 12.0–15.0)
MCH: 32 pg (ref 26.0–34.0)
MCHC: 34.1 g/dL (ref 30.0–36.0)
MCV: 94 fL (ref 78.0–100.0)
PLATELETS: 165 10*3/uL (ref 150–400)
RBC: 4.81 MIL/uL (ref 3.87–5.11)
RDW: 12.4 % (ref 11.5–15.5)
WBC: 7.2 10*3/uL (ref 4.0–10.5)

## 2018-03-28 LAB — URINALYSIS, ROUTINE W REFLEX MICROSCOPIC
Bilirubin Urine: NEGATIVE
GLUCOSE, UA: NEGATIVE mg/dL
Hgb urine dipstick: NEGATIVE
KETONES UR: NEGATIVE mg/dL
LEUKOCYTES UA: NEGATIVE
Nitrite: NEGATIVE
PROTEIN: NEGATIVE mg/dL
Specific Gravity, Urine: 1.015 (ref 1.005–1.030)
pH: 5 (ref 5.0–8.0)

## 2018-03-28 LAB — COMPREHENSIVE METABOLIC PANEL
ALT: 13 U/L — AB (ref 14–54)
AST: 24 U/L (ref 15–41)
Albumin: 4 g/dL (ref 3.5–5.0)
Alkaline Phosphatase: 66 U/L (ref 38–126)
Anion gap: 13 (ref 5–15)
BILIRUBIN TOTAL: 1.6 mg/dL — AB (ref 0.3–1.2)
BUN: 18 mg/dL (ref 6–20)
CALCIUM: 9.4 mg/dL (ref 8.9–10.3)
CO2: 27 mmol/L (ref 22–32)
Chloride: 94 mmol/L — ABNORMAL LOW (ref 101–111)
Creatinine, Ser: 0.8 mg/dL (ref 0.44–1.00)
GFR, EST NON AFRICAN AMERICAN: 60 mL/min — AB (ref >60)
Glucose, Bld: 139 mg/dL — ABNORMAL HIGH (ref 65–99)
Potassium: 4.3 mmol/L (ref 3.5–5.1)
Sodium: 134 mmol/L — ABNORMAL LOW (ref 135–145)
TOTAL PROTEIN: 7.4 g/dL (ref 6.5–8.1)

## 2018-03-28 LAB — I-STAT CG4 LACTIC ACID, ED: LACTIC ACID, VENOUS: 1.26 mmol/L (ref 0.5–1.9)

## 2018-03-28 MED ORDER — SODIUM CHLORIDE 0.9 % IV BOLUS
500.0000 mL | Freq: Once | INTRAVENOUS | Status: AC
  Administered 2018-03-28: 500 mL via INTRAVENOUS

## 2018-03-28 MED ORDER — ACETAMINOPHEN 325 MG PO TABS
650.0000 mg | ORAL_TABLET | Freq: Once | ORAL | Status: AC
  Administered 2018-03-28: 650 mg via ORAL
  Filled 2018-03-28: qty 2

## 2018-03-28 NOTE — Discharge Instructions (Addendum)

## 2018-03-28 NOTE — ED Provider Notes (Signed)
[facility] DEPT Provider Note   CSN: [id number] Arrival date & time: 03/28/18  0029     History   Chief Complaint Chief Complaint  Patient presents with  . Dehydration    HPI Julie Walter is a 84 y.o. female.  The history is provided by the patient and a relative.  Weakness  This is a new problem. The current episode started more than 2 days ago. The problem has been gradually worsening. There was no focality noted. There has been no fever. Pertinent negatives include no shortness of breath, no chest pain and no vomiting.   Patient had a dental extraction done last week, and since that time she has had minimal appetite.  She also reports that she did have some diarrhea last week and feels dehydrated. No fevers or vomiting.  No new chest pain or shortness of breath.  No significant abdominal pain Past Medical History:  Diagnosis Date  . Anemia   . Aortic stenosis   . Arthritis   . Breast cancer ([place])   . Carpal tunnel syndrome   . Chronic pain   . Colon cancer ([place])   . Fibromyalgia   . Hematuria   . History of hip replacement    bilateral  . Hyperglycemia   . Hyperlipidemia   . Hypertension   . Lumbago   . Memory loss   . Osteoarthritis   . Osteopenia     Patient Active Problem List   Diagnosis Date Noted  . Essential hypertension 01/10/2018  . Mixed hyperlipidemia 01/10/2018  . Multiple joint pain 01/10/2018  . Tremor, essential 01/10/2018  . Left sided sciatica 01/10/2018  . Lumbar radiculopathy 01/10/2018  . Aortic stenosis, severe 01/10/2018  . Type 2 diabetes mellitus without complication, without long-term current use of insulin ([place]) 01/10/2018    Past Surgical History:  Procedure Laterality Date  . ABDOMINAL HYSTERECTOMY  1958  . BREAST LUMPECTOMY  2008  . CHOLECYSTECTOMY  2002  . colon cancer tumor  1993   Dr. Jens Som  . hip replacements     2013 and 2014     OB History   None      Home Medications     Prior to Admission medications   Medication Sig Start Date End Date Taking? Authorizing Provider  diclofenac sodium (VOLTAREN) 1 % GEL Apply topically 2 (two) times daily. 01/10/18  Yes Eulas Post, Monica, DO  docusate sodium (COLACE) 100 MG capsule Take 100 mg by mouth daily.   Yes [provider]  hydrochlorothiazide (HYDRODIURIL) 25 MG tablet Take 0.5 tablets (12.5 mg total) by mouth daily. 12/25/17  Yes Gildardo Cranker, DO  isosorbide mononitrate (IMDUR) 30 MG 24 hr tablet Take 1 tablet (30 mg total) daily by mouth. 09/25/17  Yes Gildardo Cranker, DO  Lactobacillus (PROBIOTIC ACIDOPHILUS PO) Take 1 tablet by mouth every other day.   Yes [provider]  Multiple Vitamin (MULTIVITAMIN WITH MINERALS) TABS tablet Take 1 tablet by mouth daily.   Yes [provider]  oxyCODONE (OXY IR/ROXICODONE) 5 MG immediate release tablet Take 1 tablet (5 mg total) by mouth every 4 (four) hours as needed for moderate pain or severe pain. 12/16/17  Yes Eulas Post, Monica, DO  polyethylene glycol [facility] / GLYCOLAX) packet Take 17 g by mouth every other day.   Yes [provider]  simvastatin (ZOCOR) 40 MG tablet Take 1 tablet (40 mg total) daily by mouth. 09/25/17  Yes Gildardo Cranker, DO  propranolol (INDERAL) 10 MG tablet  Take 1 tablet (10 mg total) by mouth 2 (two) times daily. For tremor. Hold dose if heart rate < 60 Patient not taking: Reported on 03/28/2018 01/10/18   Gildardo Cranker, DO    Family History Family History  Problem Relation Age of Onset  . Cancer Mother   . Heart disease Mother   . Cancer Sister   . Cancer Brother   . Cancer Sister   . Heart failure Brother   . Cancer Sister   . Heart failure Brother   . Hemachromatosis Son   . Diabetes Son     Social History Social History   Tobacco Use  . Smoking status: Former Smoker    Packs/day: 2.00    Years: 20.00    Pack years: 40.00  . Smokeless tobacco: Never Used  . Tobacco comment: quit 25 years ago    Substance Use Topics  . Alcohol use: Yes    Comment: 1 glass of wine every couple weeks  . Drug use: No     Allergies   Patient has no known allergies.   Review of Systems Review of Systems  Constitutional: Positive for fatigue. Negative for fever.  Respiratory: Negative for shortness of breath.   Cardiovascular: Negative for chest pain.  Gastrointestinal: Negative for vomiting.  Neurological: Positive for weakness.  All other systems reviewed and are negative.    Physical Exam Updated Vital Signs BP 126/60   Pulse 80   Temp 99.1 F (37.3 C) (Oral)   Resp 20   SpO2 92%   Physical Exam CONSTITUTIONAL: Elderly, no acute distress, appears younger than stated age HEAD: Normocephalic/atraumatic EYES: EOMI/PERRL ENMT: Mucous membranes moist NECK: supple no meningeal signs SPINE/BACK:entire spine nontender CV: S1/S2 noted, murmur noted LUNGS: Lungs are clear to auscultation bilaterally, no apparent distress ABDOMEN: soft, mild right lower quadrant tenderness, no rebound or guarding, bowel sounds noted throughout abdomen GU:no cva tenderness NEURO: Pt is awake/alert/appropriate, moves all extremitiesx4.  No facial droop.   EXTREMITIES: pulses normal/equal, full ROM SKIN: warm, color normal PSYCH: no abnormalities of mood noted, alert and oriented to situation   ED Treatments / Results  Labs (all labs ordered are listed, but only abnormal results are displayed) Labs Reviewed  COMPREHENSIVE METABOLIC PANEL - Abnormal; Notable for the following components:      Result Value   Sodium 134 (*)    Chloride 94 (*)    Glucose, Bld 139 (*)    ALT 13 (*)    Total Bilirubin 1.6 (*)    GFR calc non Af Amer 60 (*)    All other components within normal limits  CBC - Abnormal; Notable for the following components:   Hemoglobin 15.4 (*)    All other components within normal limits  URINALYSIS, ROUTINE W REFLEX MICROSCOPIC - Abnormal; Notable for the following components:    APPearance HAZY (*)    All other components within normal limits  I-STAT CG4 LACTIC ACID, ED    EKG EKG Interpretation  Date/Time:  Friday Mar 28 2018 06:13:21 EDT Ventricular Rate:  66 PR Interval:    QRS Duration: 98 QT Interval:  428 QTC Calculation: 449 R Axis:   -59 Text Interpretation:  Sinus rhythm Left anterior fascicular block Abnormal R-wave progression, late transition Probable left ventricular hypertrophy Baseline wander in lead(s) V6 No previous ECGs available Confirmed by Ripley Fraise [id number]) on 03/28/2018 6:19:29 AM   Radiology No results found.  Procedures Procedures    Medications Ordered in ED Medications  sodium  chloride 0.9 % bolus 500 mL (0 mLs Intravenous Stopped 03/28/18 0525)  acetaminophen (TYLENOL) tablet 650 mg (650 mg Oral Given 03/28/18 0419)     Initial Impression / Assessment and Plan / ED Course  I have reviewed the triage vital signs and the nursing notes.  Pertinent labs  results that were available during my care of the patient were reviewed by me and considered in my medical decision making (see chart for details).     4:28 AM Patient in the emergency department for fatigue over the past week due to recent dental extraction as well as diarrhea Overall she is well-appearing.  She feels that her abdominal discomfort was due to new onset constipation she reports her diarrhea was last week  She declines imaging at this time     Monitored in the ER and feels improved.  She is ambulatory without difficulty. She declines any further testing.  She is staying with her daughter for few days.  We discussed strict return precautions  Patient is appropriate for d/c home.  I doubt acute abdominal emergency at this time.  We discussed strict ER return precautions including worsening abdominal pain fever >100.79F with vomiting over next 8-12 hours Final Clinical Impressions(s) / ED Diagnoses   Final diagnoses:  Malaise and fatigue    ED  Discharge Orders    None       Ripley Fraise, MD 03/28/18 [phone]

## 2018-03-28 NOTE — ED Triage Notes (Signed)
Pt from home with c/o possible dehydration. Pt reports she feels weak. Pt's daughter states she had 2 teeth pulled on Friday and was put on antibiotics for this which caused diarrhea. Pt states that resolved after Friday, but she has felt fatigued ever since. Vital signs stable.

## 2018-03-28 NOTE — ED Notes (Signed)
Pt aware that urine sample is needed.  

## 2018-03-28 NOTE — ED Notes (Signed)
Pt ambulated from the room to the bathroom and back to the room without any trouble

## 2018-03-28 NOTE — ED Notes (Signed)
EKG given to Dr. Christy Gentles

## 2018-03-31 ENCOUNTER — Ambulatory Visit (INDEPENDENT_AMBULATORY_CARE_PROVIDER_SITE_OTHER): Payer: Medicare HMO | Admitting: Nurse Practitioner

## 2018-03-31 ENCOUNTER — Encounter: Payer: Self-pay | Admitting: Nurse Practitioner

## 2018-03-31 VITALS — BP 134/64 | HR 74 | Temp 97.9°F | Resp 16 | Ht <= 58 in | Wt 123.8 lb

## 2018-03-31 DIAGNOSIS — J302 Other seasonal allergic rhinitis: Secondary | ICD-10-CM | POA: Diagnosis not present

## 2018-03-31 DIAGNOSIS — R531 Weakness: Secondary | ICD-10-CM | POA: Diagnosis not present

## 2018-03-31 NOTE — Patient Instructions (Signed)
For allergies okay flonase 1 spray twice daily and/or zyrtec 10 mg by mouth daily for allergies

## 2018-03-31 NOTE — Progress Notes (Signed)
Careteam: Patient Care Team: Gildardo Cranker, DO as PCP - General (Internal Medicine)  No Known Allergies  Chief Complaint  Patient presents with  . Acute Visit    Patient mentioned that she has been having a severe headache since she got her tooth extracted last Friday. Patient stated that she took some Claritin and Oxycodone. Last night she used a warm compress and Oxycodone.   . Medication Refill    No refills needed at this time.      HPI: Patient is a 84 y.o. female seen in the office today due to headache since after 2 teeth (molars) extracted on 03/21/18. Procedure went however after that has had a hard recovery. She was not eating or drinking well afterwards. On 5/17 she went to the ED due to weakness and fatigue. Work up was negative, gave her some IV fluids. Had diarrhea but that has improved.  Uses ice after tooth extracted and now having sinus issues. Last time she had a tooth procedure used ice and caused her some neuralgias. Feels like her sinuses are causing her issues.  Was having pain under right eye but this has improved and without pain.  Denies congestion at this time.  Went to bed last night and slept.  No itchy eyes, no runny nose.  Has been sneezing but no more than normal. No cough or chest congestion   Moved from [place] and more allergies since she has been here.  Better today. Daughter reports a little stronger.  More fatigue in the morning but more alert and normal in the evening.   Review of Systems:  Review of Systems  Constitutional: Positive for malaise/fatigue. Negative for chills and fever.  HENT: Negative for congestion, ear pain, hearing loss, sinus pain, sore throat and tinnitus.   Eyes: Negative for discharge and redness.  Respiratory: Negative for cough and shortness of breath.   Cardiovascular: Negative for chest pain.  Neurological: Negative for dizziness and headaches.    Past Medical History:  Diagnosis Date  . Anemia   . Aortic  stenosis   . Arthritis   . Breast cancer ([place])   . Carpal tunnel syndrome   . Chronic pain   . Colon cancer ([place])   . Fibromyalgia   . Hematuria   . History of hip replacement    bilateral  . Hyperglycemia   . Hyperlipidemia   . Hypertension   . Lumbago   . Memory loss   . Osteoarthritis   . Osteopenia    Past Surgical History:  Procedure Laterality Date  . ABDOMINAL HYSTERECTOMY  1958  . BREAST LUMPECTOMY  2008  . CHOLECYSTECTOMY  2002  . colon cancer tumor  1993   Dr. Jens Som  . hip replacements     2013 and 2014   Social History:   reports that she has quit smoking. She has a 40.00 pack-year smoking history. She has never used smokeless tobacco. She reports that she drinks alcohol. She reports that she does not use drugs.  Family History  Problem Relation Age of Onset  . Cancer Mother   . Heart disease Mother   . Cancer Sister   . Cancer Brother   . Cancer Sister   . Heart failure Brother   . Cancer Sister   . Heart failure Brother   . Hemachromatosis Son   . Diabetes Son     Medications: Patient's Medications  New Prescriptions   No medications on file  Previous Medications  DICLOFENAC SODIUM (VOLTAREN) 1 % GEL    Apply topically 2 (two) times daily.   DOCUSATE SODIUM (COLACE) 100 MG CAPSULE    Take 100 mg by mouth daily.   HYDROCHLOROTHIAZIDE (HYDRODIURIL) 25 MG TABLET    Take 0.5 tablets (12.5 mg total) by mouth daily.   ISOSORBIDE MONONITRATE (IMDUR) 30 MG 24 HR TABLET    Take 1 tablet (30 mg total) daily by mouth.   LACTOBACILLUS (PROBIOTIC ACIDOPHILUS PO)    Take 1 tablet by mouth every other day.   MULTIPLE VITAMIN (MULTIVITAMIN WITH MINERALS) TABS TABLET    Take 1 tablet by mouth daily.   OXYCODONE (OXY IR/ROXICODONE) 5 MG IMMEDIATE RELEASE TABLET    Take 1 tablet (5 mg total) by mouth every 4 (four) hours as needed for moderate pain or severe pain.   POLYETHYLENE GLYCOL (MIRALAX / GLYCOLAX) PACKET    Take 17 g by mouth every other day.    PROPRANOLOL (INDERAL) 10 MG TABLET    Take 1 tablet (10 mg total) by mouth 2 (two) times daily. For tremor. Hold dose if heart rate < 60   SIMVASTATIN (ZOCOR) 40 MG TABLET    Take 1 tablet (40 mg total) daily by mouth.  Modified Medications   No medications on file  Discontinued Medications   No medications on file     Physical Exam:  Vitals:   03/31/18 1148  BP: 134/64  Pulse: 74  Resp: 16  Temp: 97.9 F (36.6 C)  TempSrc: Oral  SpO2: 91%  Weight: 123 lb 12.8 oz (56.2 kg)  Height: 4\' 10"  (1.473 m)   Body mass index is 25.87 kg/m.  Physical Exam  Constitutional: She is oriented to person, place, and time. She appears well-developed and well-nourished. No distress.  HENT:  Head: Normocephalic and atraumatic.  Right Ear: External ear normal.  Left Ear: External ear normal.  Nose: Rhinorrhea present.  Mouth/Throat: Oropharynx is clear and moist. No oropharyngeal exudate.  Eyes: Pupils are equal, round, and reactive to light. EOM are normal. No scleral icterus.  Neck: Normal range of motion. Neck supple.  Cardiovascular: Normal rate, regular rhythm and intact distal pulses. Exam reveals no gallop and no friction rub.  Murmur (3/6 SEM-->carotid b/l) heard. Pulmonary/Chest: Effort normal and breath sounds normal. No respiratory distress. She has no wheezes. She has no rhonchi. She has no rales. She exhibits no tenderness. Right breast exhibits no inverted nipple, no mass, no nipple discharge, no skin change and no tenderness. Left breast exhibits no inverted nipple, no mass, no nipple discharge, no skin change and no tenderness. Breasts are symmetrical.  Abdominal: Soft. Bowel sounds are normal. There is no hepatosplenomegaly or hepatomegaly.  Lymphadenopathy:    She has no cervical adenopathy.  Neurological: She is alert and oriented to person, place, and time. She has normal reflexes.  Skin: Skin is warm and dry.  Psychiatric: She has a normal mood and affect.    Labs  reviewed: Basic Metabolic Panel: Recent Labs    10/08/17 0956 01/10/18 1420 03/28/18 0302  NA 142 142 134*  K 3.9 4.4 4.3  CL 101 101 94*  CO2 32 32 27  GLUCOSE 128* 116 139*  BUN 20 18 18   CREATININE 0.94* 0.73 0.80  CALCIUM 9.8 9.9 9.4   Liver Function Tests: Recent Labs    10/08/17 0956 01/10/18 1420 03/28/18 0302  AST 18 15 24   ALT 9 11 13*  ALKPHOS  --   --  66  BILITOT  1.0 0.8 1.6*  PROT 6.4 6.3 7.4  ALBUMIN  --   --  4.0   No results for input(s): LIPASE, AMYLASE in the last 8760 hours. No results for input(s): AMMONIA in the last 8760 hours. CBC: Recent Labs    07/12/17 10/08/17 0956 03/28/18 0302  WBC 7.2 6.5 7.2  NEUTROABS 5 4,303  --   HGB 16.2* 15.0 15.4*  HCT 47* 44.4 45.2  MCV  --  93.7 94.0  PLT 145* 135* 165   Lipid Panel: Recent Labs    07/12/17 10/08/17 0956 01/10/18 1420  CHOL 185 163 176  HDL 53 55 53  LDLCALC 93 84 93  TRIG 182* 141 204*  CHOLHDL  --  3.0 3.3   TSH: No results for input(s): TSH in the last 8760 hours. A1C: Lab Results  Component Value Date   HGBA1C 6.2 (H) 01/10/2018     Assessment/Plan 1. Seasonal allergies flonase 1 spray twice daily and/or zyrtec 10 mg by mouth daily for allergies   2. Weakness After teeth removed, slowly improving after she was not eating or drinking well after appt but now improving. To continue to encourage proper hydration and nutrition.   Next appt: 04/23/2018 Carlos American. [place], [facility] [phone]

## 2018-04-23 ENCOUNTER — Encounter: Payer: Self-pay | Admitting: Internal Medicine

## 2018-04-23 ENCOUNTER — Ambulatory Visit (INDEPENDENT_AMBULATORY_CARE_PROVIDER_SITE_OTHER): Payer: Medicare HMO | Admitting: Internal Medicine

## 2018-04-23 VITALS — BP 128/70 | HR 57 | Temp 98.1°F | Ht <= 58 in | Wt 124.8 lb

## 2018-04-23 DIAGNOSIS — R32 Unspecified urinary incontinence: Secondary | ICD-10-CM

## 2018-04-23 DIAGNOSIS — I10 Essential (primary) hypertension: Secondary | ICD-10-CM

## 2018-04-23 DIAGNOSIS — I350 Nonrheumatic aortic (valve) stenosis: Secondary | ICD-10-CM

## 2018-04-23 DIAGNOSIS — G250 Essential tremor: Secondary | ICD-10-CM

## 2018-04-23 DIAGNOSIS — E782 Mixed hyperlipidemia: Secondary | ICD-10-CM

## 2018-04-23 DIAGNOSIS — E119 Type 2 diabetes mellitus without complications: Secondary | ICD-10-CM

## 2018-04-23 LAB — TSH: TSH: 2.58 m[IU]/L (ref 0.40–4.50)

## 2018-04-23 MED ORDER — MIRABEGRON ER 25 MG PO TB24: 25.0000 mg | ORAL_TABLET | Freq: Every day | ORAL | 6 refills | Status: DC

## 2018-04-23 MED ORDER — ISOSORBIDE MONONITRATE ER 30 MG PO TB24: 30.0000 mg | ORAL_TABLET | Freq: Every day | ORAL | 1 refills | Status: DC

## 2018-04-23 NOTE — Progress Notes (Signed)
Patient ID: Julie Walter, female   DOB: October 14, 1919, 84 y.o.   MRN: [id number]   Location:  [facility] OFFICE  Provider: DR Arletha Grippe  Code Status:  Goals of Care:  Advanced Directives 12/25/2017  Does Patient Have a Medical Advance Directive? No  Type of Advance Directive -  Copy of [place] in Chart? -  Would patient like information on creating a medical advance directive? Yes (MAU/Ambulatory/Procedural Areas - Information given)     Chief Complaint  Patient presents with  . Medical Management of Chronic Issues    3 month follow up. Patient is concerned with her incontinence getting worse.   . Medication Refill    Isosorbide Mononitrate needed today.    HPI: Patient is a 84 y.o. female seen today for medical management of chronic diseases.  She had 2 teeth removed since her last visit. Another tooth has broken off since and she now needs that tooth removed. She did not do well afterwards and became gradually weaker. She ended up in the ER 03/28/18 and w/u neg. She was given IVF which helped. Her daughter took her home and pushed fluids/food x 2 weeks. Strength now normal.   She is now c/a urinary incontinence that is worse at night. She "fills up pads". vesicare was ineffective. She never tried [employer]  DM - diet controlled. A1c 6.2%. Urine microalbumin/Cr ratio 12  Hyperlipidemia - stable on simvastatin. LDL 84; HDL 55  HTN - stable on HCTZ. She has increased urinary frequency at least 6x/per night  Hemoconcentration - stable. H/H 15/44.4. She was told it was due to dehydration  Severe AS - 2D echo EF 60-65% with critical AS. Surgery recommended in Spring 2018 but she declined due to c/a severe joint pain. She has dizziness but no SOB or CP. She takes imdur.  Urinary incontinance - she has leakage, urge related. vesicare was ineffective in the past and caused inability to urinate, dry mouth. She has never tried [employer].  Chronic left sciatic pain -  improved s/p epidural. she tried 6 tx of accupuncture without relief this yr. It resolved after 1 month last year. She has not tried PT (she has b/l THR and had PT then). She reports hx scoliosis.   Chronic joint pain/lumbar radiculopathy - she rec'd epidural in lumbar spine in Jan 2019 by Dr Ernestina Patches. She takes OxyIR 1 tab per day. She takes Aleve prn. She was followed by Ortho in [facility]. She has tried hydrocodone in the past and found it to be more effective than oxycodone. She uses voltaren gel prn.  BET - improved. She took several doses of propanolol but it caused nausea/stomach upset. She has not taken anymore.  Past Medical History:  Diagnosis Date  . Anemia   . Aortic stenosis   . Arthritis   . Breast cancer ([place])   . Carpal tunnel syndrome   . Chronic pain   . Colon cancer ([place])   . Fibromyalgia   . Hematuria   . History of hip replacement    bilateral  . Hyperglycemia   . Hyperlipidemia   . Hypertension   . Lumbago   . Memory loss   . Osteoarthritis   . Osteopenia     Past Surgical History:  Procedure Laterality Date  . ABDOMINAL HYSTERECTOMY  1958  . BREAST LUMPECTOMY  2008  . CHOLECYSTECTOMY  2002  . colon cancer tumor  1993   Dr. Jens Som  . hip replacements  2013 and 2014     reports that she has quit smoking. She has a 40.00 pack-year smoking history. She has never used smokeless tobacco. She reports that she drinks alcohol. She reports that she does not use drugs. Social History   Socioeconomic History  . Marital status: Widowed    Spouse name: Not on file  . Number of children: Not on file  . Years of education: Not on file  . Highest education level: Not on file  Occupational History  . Not on file  Social Needs  . Financial resource strain: Not hard at all  . Food insecurity:    Worry: Never true    Inability: Never true  . Transportation needs:    Medical: No    Non-medical: No  Tobacco Use  . Smoking status: Former Smoker    Packs/day: 2.00      Years: 20.00    Pack years: 40.00  . Smokeless tobacco: Never Used  . Tobacco comment: quit 25 years ago  Substance and Sexual Activity  . Alcohol use: Yes    Comment: 1 glass of wine every couple weeks  . Drug use: No  . Sexual activity: Not Currently  Lifestyle  . Physical activity:    Days per week: 2 days    Minutes per session: 20 min  . Stress: To some extent  Relationships  . Social connections:    Talks on phone: More than three times a week    Gets together: More than three times a week    Attends religious service: Never    Active member of club or organization: No    Attends meetings of clubs or organizations: Never    Relationship status: Widowed  . Intimate partner violence:    Fear of current or ex partner: No    Emotionally abused: No    Physically abused: No    Forced sexual activity: No  Other Topics Concern  . Not on file  Social History Narrative   Social History      Diet? light      Do you drink/eat things with caffeine? Coffee 1/day      Marital status?             widowed                       What year were you married? 1944      Do you live in a house, apartment, assisted living, condo, trailer, etc.? Independent living      Is it one or more stories? 3 stories      How many persons live in your home? 1      Do you have any pets in your home? (please list)  none      Highest level of education completed? High school + secretarial studies post      Current or past profession: Herbalist, Arts administrator      Do you exercise?                 yes                     Type & how often? Walking, back exercises      Advanced Directives      Do you have a living will? yes      Do you have a DNR form?       no  If not, do you want to discuss one? yes      Do you have signed POA/HPOA for forms? yes      Functional Status      Do you have difficulty bathing or dressing yourself? no      Do you have  difficulty preparing food or eating? no      Do you have difficulty managing your medications? no      Do you have difficulty managing your finances? no      Do you have difficulty affording your medications? no    Family History  Problem Relation Age of Onset  . Cancer Mother   . Heart disease Mother   . Cancer Sister   . Cancer Brother   . Cancer Sister   . Heart failure Brother   . Cancer Sister   . Heart failure Brother   . Hemachromatosis Son   . Diabetes Son     No Known Allergies  Outpatient Encounter Medications as of 04/23/2018  Medication Sig  . amoxicillin (AMOXIL) 500 MG capsule Take 500 mg by mouth. 4 before dental work  . cetirizine (ZYRTEC) 5 MG tablet Take 5 mg by mouth daily.  . diclofenac sodium (VOLTAREN) 1 % GEL Apply topically 2 (two) times daily.  Marland Kitchen docusate sodium (COLACE) 100 MG capsule Take 100 mg by mouth daily.  . hydrochlorothiazide (HYDRODIURIL) 25 MG tablet Take 0.5 tablets (12.5 mg total) by mouth daily.  . isosorbide mononitrate (IMDUR) 30 MG 24 hr tablet Take 1 tablet (30 mg total) daily by mouth.  . Lactobacillus (PROBIOTIC ACIDOPHILUS PO) Take 1 tablet by mouth every other day.  . Multiple Vitamin (MULTIVITAMIN WITH MINERALS) TABS tablet Take 1 tablet by mouth daily.  . naproxen sodium (ALEVE) 220 MG tablet Take 220 mg by mouth daily as needed.  Marland Kitchen oxyCODONE (OXY IR/ROXICODONE) 5 MG immediate release tablet Take 1 tablet (5 mg total) by mouth every 4 (four) hours as needed for moderate pain or severe pain.  . polyethylene glycol (MIRALAX / GLYCOLAX) packet Take 17 g by mouth every other day.  . propranolol (INDERAL) 10 MG tablet Take 1 tablet (10 mg total) by mouth 2 (two) times daily. For tremor. Hold dose if heart rate < 60  . simvastatin (ZOCOR) 40 MG tablet Take 1 tablet (40 mg total) daily by mouth.   No facility-administered encounter medications on file as of 04/23/2018.     Review of Systems:  Review of Systems  Genitourinary:  Positive for frequency (2-3 times at night).  Neurological: Positive for tremors.  All other systems reviewed and are negative.   Health Maintenance  Topic Date Due  . OPHTHALMOLOGY EXAM  12/10/1930  . TETANUS/TDAP  12/11/1939  . DEXA SCAN  12/10/1985  . INFLUENZA VACCINE  06/12/2018  . HEMOGLOBIN A1C  07/13/2018  . URINE MICROALBUMIN  10/08/2018  . FOOT EXAM  01/11/2019  . PNA vac Low Risk Adult  Completed    Physical Exam: Vitals:   04/23/18 1422  BP: 128/70  Pulse: (!) 57  Temp: 98.1 F (36.7 C)  SpO2: 94%  Weight: 124 lb 12.8 oz (56.6 kg)  Height: 4' 10" (1.473 m)   Body mass index is 26.08 kg/m. Physical Exam  Constitutional: She is oriented to person, place, and time. She appears well-developed and well-nourished.  HENT:  Mouth/Throat: Oropharynx is clear and moist. No oropharyngeal exudate.  MMM; no oral thrush  Eyes: Pupils are equal, round, and reactive to light. No  scleral icterus.  Neck: Neck supple. Carotid bruit is present (from chest). No tracheal deviation present. No thyromegaly present.  Cardiovascular: Normal rate, regular rhythm and intact distal pulses. Exam reveals no gallop and no friction rub.  Murmur (2/6 SEM --> carotid b/l) heard. No LE edema b/l. no calf TTP.   Pulmonary/Chest: Effort normal and breath sounds normal. No stridor. No respiratory distress. She has no wheezes. She has no rales.  Abdominal: Soft. Normal appearance and bowel sounds are normal. She exhibits no distension and no mass. There is no hepatomegaly. There is no tenderness. There is no rigidity, no rebound and no guarding. No hernia.  Musculoskeletal: She exhibits edema.  Lymphadenopathy:    She has no cervical adenopathy.  Neurological: She is alert and oriented to person, place, and time. She has normal reflexes. She displays tremor (resting).  Skin: Skin is warm and dry. No rash noted.  Psychiatric: She has a normal mood and affect. Her behavior is normal. Judgment and  thought content normal.    Labs reviewed: Basic Metabolic Panel: Recent Labs    10/08/17 0956 01/10/18 1420 03/28/18 0302  NA 142 142 134*  K 3.9 4.4 4.3  CL 101 101 94*  CO2 32 32 27  GLUCOSE 128* 116 139*  BUN _0 CREATININE 0.94* 0.73 0.80  CALCIUM 9.8 9.9 9.4   Liver Function Tests: Recent Labs    10/08/17 0956 01/10/18 1420 03/28/18 0302  AST _1 ALT 9 11 13*  ALKPHOS  --   --  66  BILITOT 1.0 0.8 1.6*  PROT 6.4 6.3 7.4  ALBUMIN  --   --  4.0   No results for input(s): LIPASE, AMYLASE in the last 8760 hours. No results for input(s): AMMONIA in the last 8760 hours. CBC: Recent Labs    07/12/17 10/08/17 0956 03/28/18 0302  WBC 7.2 6.5 7.2  NEUTROABS 5 4,303  --   HGB 16.2* 15.0 15.4*  HCT 47* 44.4 45.2  MCV  --  93.7 94.0  PLT 145* 135* 165   Lipid Panel: Recent Labs    07/12/17 10/08/17 0956 01/10/18 1420  CHOL 185 163 176  HDL 53 55 53  LDLCALC 93 84 93  TRIG 182* 141 204*  CHOLHDL  --  3.0 3.3   Lab Results  Component Value Date   HGBA1C 6.2 (H) 01/10/2018    Procedures since last visit: No results found.  Assessment/Plan   ICD-10-CM   1. Urinary incontinence, unspecified type R32 mirabegron ER (MYRBETRIQ) 25 MG TB24 tablet  2. Aortic stenosis, severe I35.0 isosorbide mononitrate (IMDUR) 30 MG 24 hr tablet   critical  3. Essential hypertension I10   4. Tremor, essential G25.0 TSH   improved  5. Mixed hyperlipidemia E78.2 TSH    Lipid Panel  6. Type 2 diabetes mellitus without complication, without long-term current use of insulin (HCC) E11.9 Hemoglobin A1c    BMP with eGFR([facility])    ALT   START MYRBETRIQ 25MG DAILY FOR OVERACTIVE BLADDER  Will call with lab results  Continue other medications as ordered  Follow up in 3 mos for OAB, DM, hyperlipidemia, HTN. Fasting labs prior to appt.    Yuya Vanwingerden S. Perlie Gold  [facility] [address] [place], [place]  [address] [phone] Cell (Monday-Friday 8 AM - 5 PM) [phone] After 5 PM and follow prompts

## 2018-04-23 NOTE — Patient Instructions (Addendum)
START MYRBETRIQ 25MG  DAILY FOR OVERACTIVE BLADDER  Continue other medications as ordered  Follow up in 3 mos for OAB, DM, hyperlipidemia, HTN. Fasting labs prior to appt.

## 2018-04-25 ENCOUNTER — Other Ambulatory Visit: Payer: Medicare HMO

## 2018-06-11 IMAGING — MR MR LUMBAR SPINE W/O CM
4 of 5 series · 19 of 48 positions shown · non-contrast
Comparison: None.

CLINICAL DATA: Lumbar spondylosis with radiculopathy

EXAM:
MRI LUMBAR SPINE WITHOUT CONTRAST
TECHNIQUE: Multiplanar, multisequence MR imaging of the lumbar spine was
performed. No intravenous contrast was administered.

[Series 2: T2 · sagittal · 4.0mm · 0.55mm/px · 6 of 16 slices shown (1 of 2)]
[im 1/16]
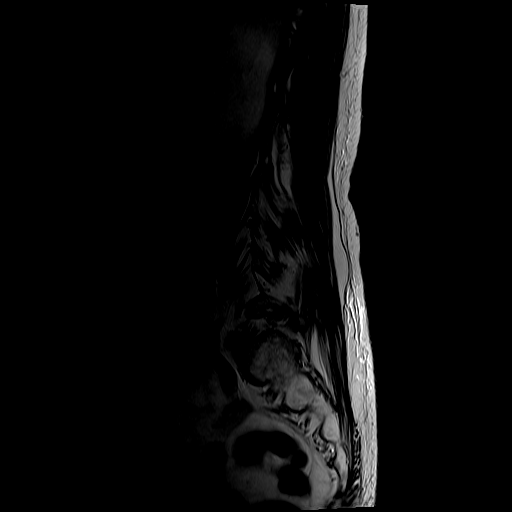
[im 4/16]
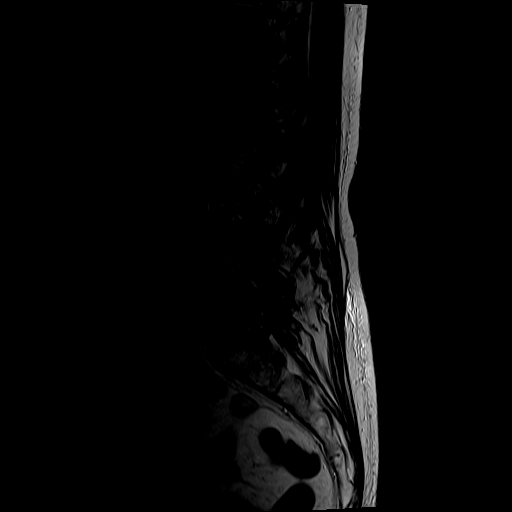
[im 7/16]
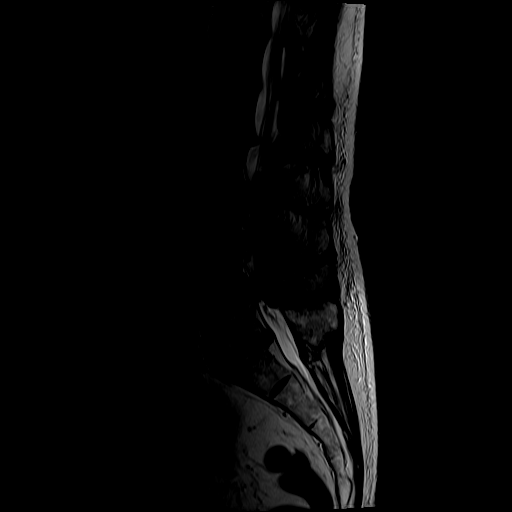
[im 10/16]
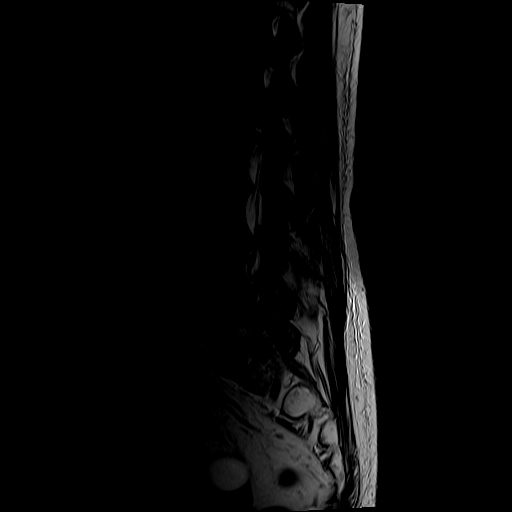
[im 13/16]
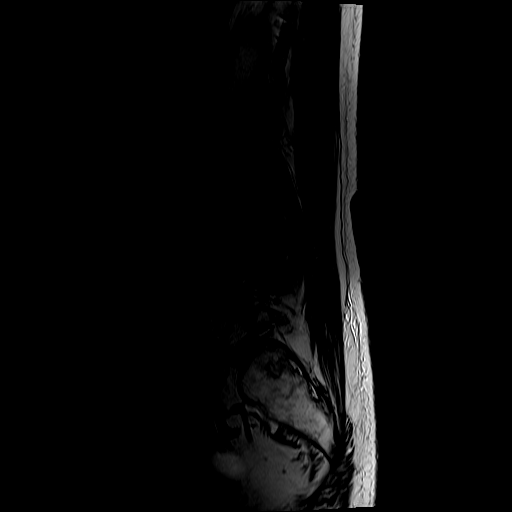
[im 16/16]
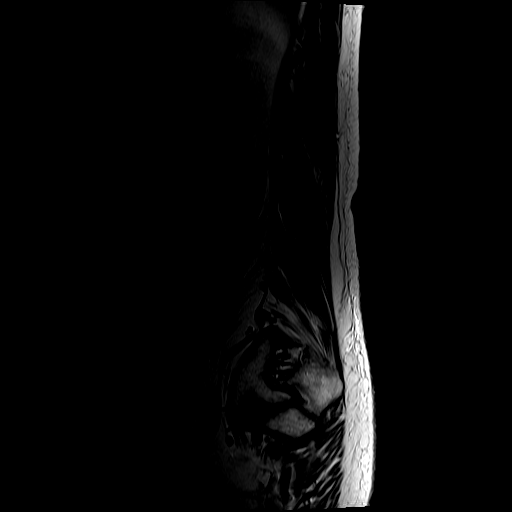

[Series 4: T1 · sagittal · 4.0mm · 0.73mm/px · 3 of 16 slices shown (1 of 2)]
[im 3/16]
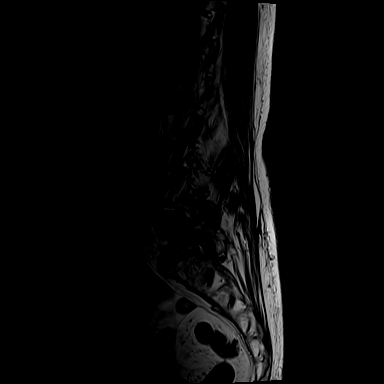
[im 8/16]
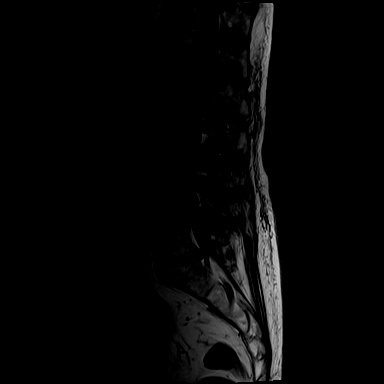
[im 13/16]
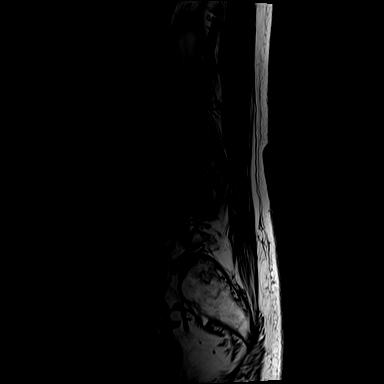

[Series 5: T2 · axial · 4.0mm · 0.39mm/px · z∈[-71,+81]mm · 7 of 34 slices shown (2 of 2)]
[im 1/34]
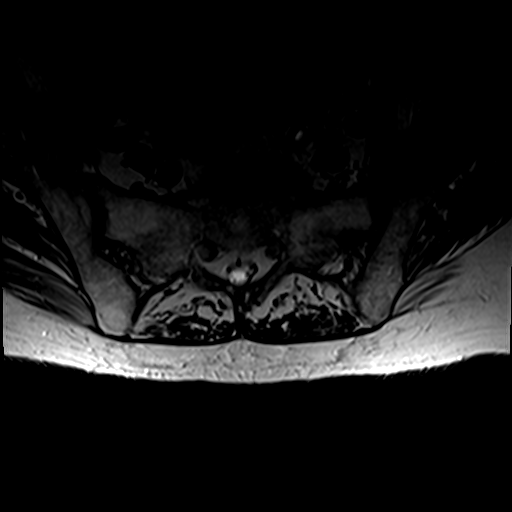
[im 6/34]
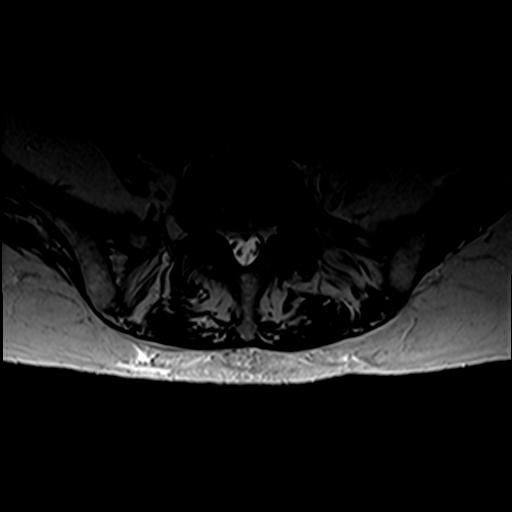
[im 11/34]
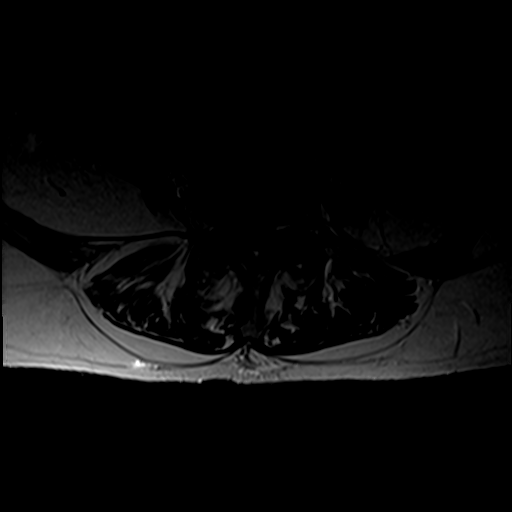
[im 16/34]
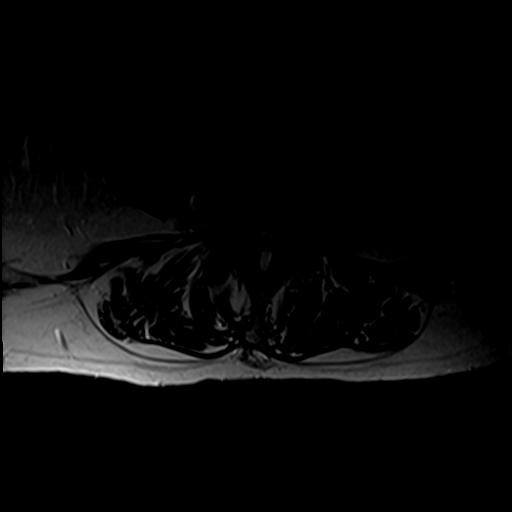
[im 18/34]
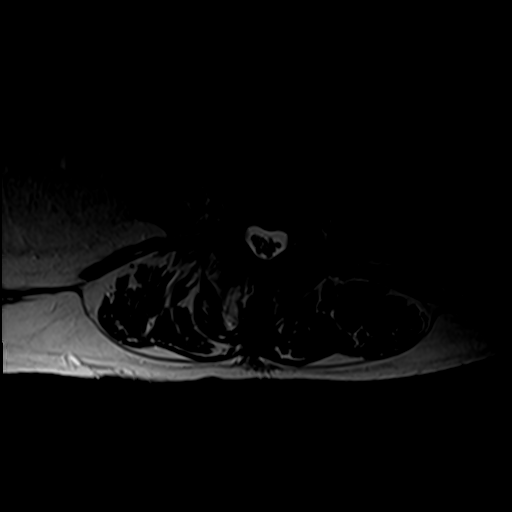
[im 23/34]
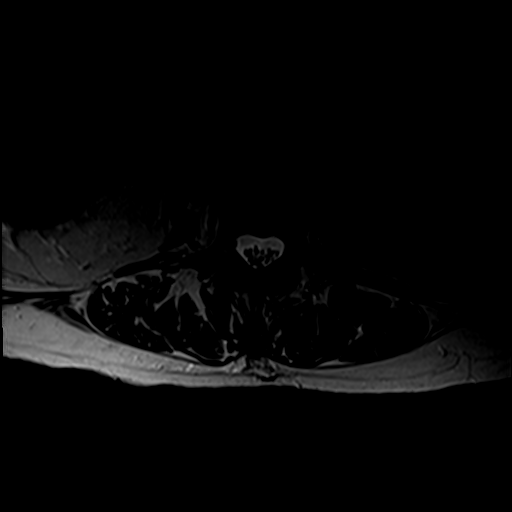
[im 28/34]
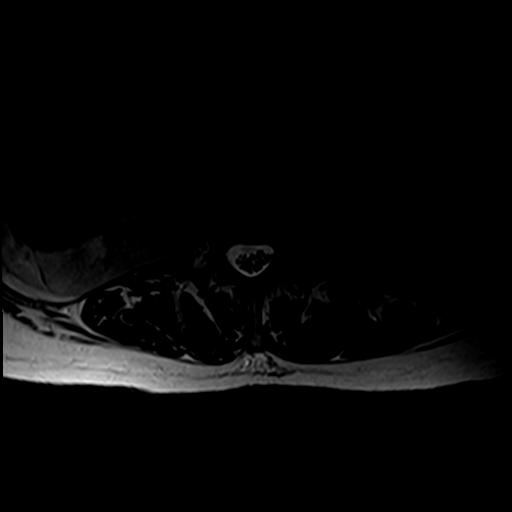

[Series 6: T1 · axial · 4.0mm · 0.31mm/px · z∈[-47,+81]mm · 3 of 34 slices shown (2 of 2)]
[im 6/34]
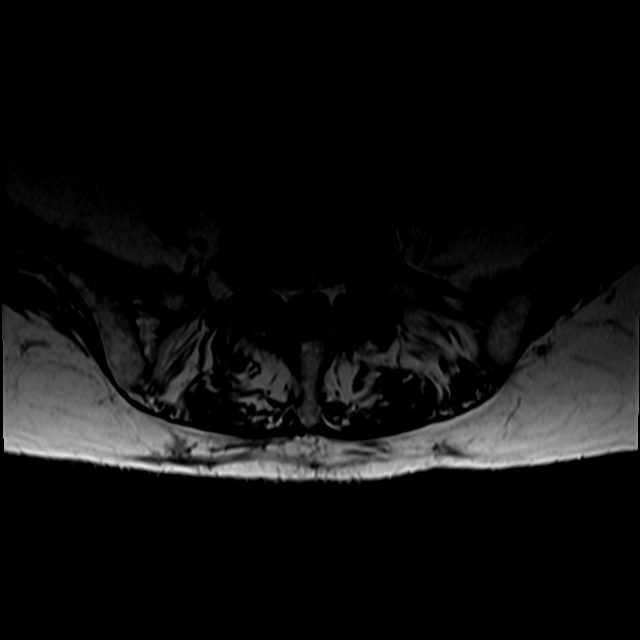
[im 18/34]
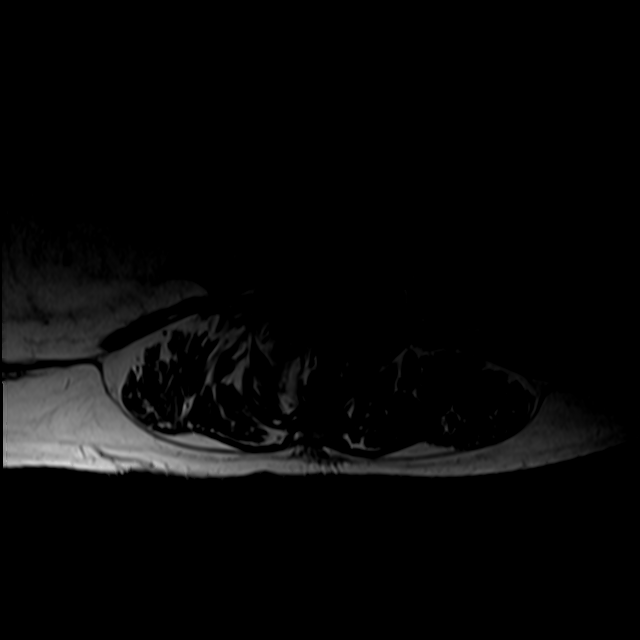
[im 28/34]
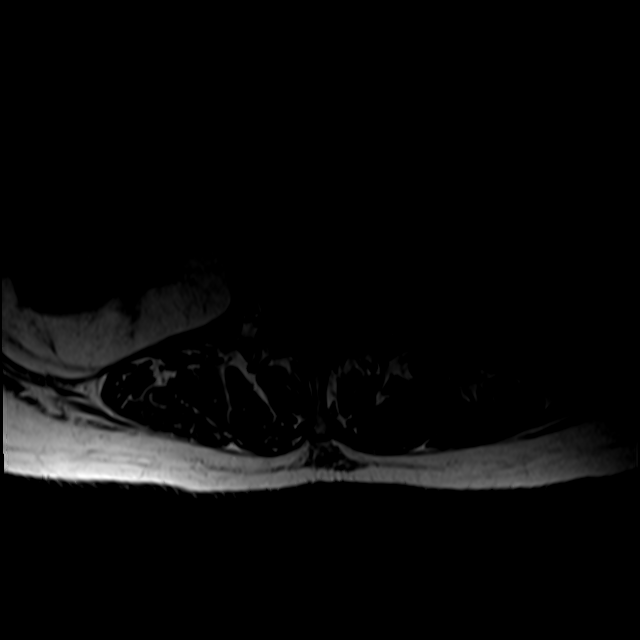

[19 of 48 positions shown; findings below may reference images not displayed]

FINDINGS: Segmentation:  Normal

Alignment: Mild retrolisthesis L1-2 L2-3 L3-4 and L5-S1. Mild
anterolisthesis L4-5

Vertebrae:  Negative for fracture or mass.

Conus medullaris and cauda equina: Conus extends to the L1 level.
Conus and cauda equina appear normal.

Paraspinal and other soft tissues: Negative for mass or fluid
collection

Disc levels:

T12-L1: Mild disc degeneration and mild facet degeneration without
stenosis

L1-2: Advanced disc degeneration with disc space narrowing and
endplate spurring. Mild spinal stenosis. Moderate subarticular
stenosis bilaterally

L2-3: Moderate disc degeneration with diffuse endplate spurring and
mild facet degeneration. Moderate subarticular stenosis on the right
and mild subarticular stenosis on the left. Mild canal stenosis.

L3-4: Advanced disc degeneration with diffuse endplate spurring.
Central disc protrusion. Bilateral facet hypertrophy with moderate
to severe spinal stenosis. Subarticular and foraminal stenosis right
greater than left.

L4-5: Severe spinal stenosis. Central disc protrusion with severe
facet and ligamentum flavum hypertrophy. Subarticular stenosis
bilaterally.

L5-S1: Moderate disc degeneration with diffuse endplate spurring.
Mild subarticular and foraminal stenosis bilaterally. Mild facet
degeneration bilaterally
IMPRESSION: Multilevel degenerative changes throughout the entire lumbar spine
with spinal and foraminal stenosis at multiple levels as above

Moderate to severe spinal stenosis at L3-4 and severe spinal
stenosis at L4-5.

## 2018-06-23 ENCOUNTER — Other Ambulatory Visit: Payer: Self-pay | Admitting: Internal Medicine

## 2018-06-23 DIAGNOSIS — E782 Mixed hyperlipidemia: Secondary | ICD-10-CM

## 2018-06-30 ENCOUNTER — Ambulatory Visit (INDEPENDENT_AMBULATORY_CARE_PROVIDER_SITE_OTHER): Payer: Medicare HMO | Admitting: Internal Medicine

## 2018-06-30 ENCOUNTER — Encounter: Payer: Self-pay | Admitting: Internal Medicine

## 2018-06-30 VITALS — BP 122/68 | HR 68 | Temp 98.0°F | Ht <= 58 in | Wt 128.4 lb

## 2018-06-30 DIAGNOSIS — E119 Type 2 diabetes mellitus without complications: Secondary | ICD-10-CM | POA: Diagnosis not present

## 2018-06-30 DIAGNOSIS — L98491 Non-pressure chronic ulcer of skin of other sites limited to breakdown of skin: Secondary | ICD-10-CM

## 2018-06-30 NOTE — Patient Instructions (Signed)
Apply the generic Bactroban once a day to the red areas after cleansing the wound.  Cover the wound with Telfa Band-Aid.

## 2018-06-30 NOTE — Progress Notes (Signed)
   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  Interim medical record and care since last [place] visit was updated with review of diagnostic studies and change in clinical status since last visit were documented.  HPI: Her laundry cart struck her left shin approximately 10 days ago.  Initially there was bleeding which resolved spontaneously.  A couple days later she noticed some pus from the lesion.  She has been using Polysporin.She also sustained a smaller abrasion on the right shin which has formed an eschar. She has a history of diabetes, most recent A1c was 01/10/18 with a prediabetic value of 6.2%.  Review of systems: Constitutional: No fever,chills,significant weight change, fatigue  Dermatologic: No rash, pruritus Hematologic/lymphatic: No significant bruising, lymphadenopathy  Physical exam:  Pertinent or positive findings: There is a small (9X9 mm) superficial abrasion of L shin with mild erythema surrounding the slightly macerated lesion.  There is a tiny eschar over the right shin.  Dorsalis pedis pulses are decreased.  Posterior tibial pulses are strong.  No ischemic changes of the feet were noted.  General appearance: Adequately nourished; no acute distress, increased work of breathing is present.   Extremities:  No cyanosis, clubbing, edema  Skin: Warm & dry w/o tenting. No significant rash.  See summary under each active problem in the Problem List with associated updated therapeutic plan

## 2018-07-01 ENCOUNTER — Encounter: Payer: Self-pay | Admitting: Internal Medicine

## 2018-07-01 NOTE — Assessment & Plan Note (Signed)
A1c was prediabetic in March of this year

## 2018-07-02 ENCOUNTER — Encounter: Payer: Self-pay | Admitting: Internal Medicine

## 2018-07-25 ENCOUNTER — Other Ambulatory Visit: Payer: Medicare HMO

## 2018-07-25 DIAGNOSIS — E782 Mixed hyperlipidemia: Secondary | ICD-10-CM

## 2018-07-25 DIAGNOSIS — E119 Type 2 diabetes mellitus without complications: Secondary | ICD-10-CM | POA: Diagnosis not present

## 2018-07-26 LAB — LIPID PANEL
Cholesterol: 167 mg/dL (ref <200)
HDL: 53 mg/dL (ref >50)
LDL Cholesterol (Calc): 88 mg/dL (calc)
NON-HDL CHOLESTEROL (CALC): 114 mg/dL (ref <130)
Total CHOL/HDL Ratio: 3.2 (calc) (ref <5.0)
Triglycerides: 156 mg/dL — ABNORMAL HIGH (ref <150)

## 2018-07-26 LAB — BASIC METABOLIC PANEL WITH GFR
BUN: 24 mg/dL (ref 7–25)
CALCIUM: 9.6 mg/dL (ref 8.6–10.4)
CHLORIDE: 105 mmol/L (ref 98–110)
CO2: 32 mmol/L (ref 20–32)
Creat: 0.81 mg/dL (ref 0.60–0.88)
GFR, Est African American: 71 mL/min/{1.73_m2} (ref >=60)
GFR, Est Non African American: 61 mL/min/{1.73_m2} (ref >=60)
GLUCOSE: 119 mg/dL — AB (ref 65–99)
POTASSIUM: 4.1 mmol/L (ref 3.5–5.3)
Sodium: 145 mmol/L (ref 135–146)

## 2018-07-26 LAB — HEMOGLOBIN A1C
EAG (MMOL/L): 7.4 (calc)
HEMOGLOBIN A1C: 6.3 %{Hb} — AB (ref <5.7)
Mean Plasma Glucose: 134 (calc)

## 2018-07-26 LAB — ALT: ALT: 8 U/L (ref 6–29)

## 2018-07-30 ENCOUNTER — Ambulatory Visit (INDEPENDENT_AMBULATORY_CARE_PROVIDER_SITE_OTHER): Payer: Medicare HMO | Admitting: Internal Medicine

## 2018-07-30 ENCOUNTER — Encounter: Payer: Self-pay | Admitting: Internal Medicine

## 2018-07-30 VITALS — BP 118/76 | HR 72 | Temp 97.9°F | Ht <= 58 in | Wt 128.0 lb

## 2018-07-30 DIAGNOSIS — I10 Essential (primary) hypertension: Secondary | ICD-10-CM

## 2018-07-30 DIAGNOSIS — I350 Nonrheumatic aortic (valve) stenosis: Secondary | ICD-10-CM | POA: Diagnosis not present

## 2018-07-30 DIAGNOSIS — R32 Unspecified urinary incontinence: Secondary | ICD-10-CM

## 2018-07-30 DIAGNOSIS — E119 Type 2 diabetes mellitus without complications: Secondary | ICD-10-CM

## 2018-07-30 DIAGNOSIS — Z23 Encounter for immunization: Secondary | ICD-10-CM | POA: Diagnosis not present

## 2018-07-30 DIAGNOSIS — M4726 Other spondylosis with radiculopathy, lumbar region: Secondary | ICD-10-CM | POA: Diagnosis not present

## 2018-07-30 DIAGNOSIS — R252 Cramp and spasm: Secondary | ICD-10-CM

## 2018-07-30 MED ORDER — OXYCODONE HCL 5 MG PO TABS: 5.0000 mg | ORAL_TABLET | ORAL | 0 refills | Status: DC | PRN

## 2018-07-30 NOTE — Patient Instructions (Addendum)
PLEASE TAKE MYRBETRIQ EVERY OTHER DAY - IF URINE STREAM DOES NOT IMPROVE, STOP MEDICATION  Continue other medications as ordered  Drink more water to help reduce leg cramps  Flu shot given today  Follow up in 3 mos with Janett Billow for OAB, AS, DM, hyperlipidemia. Fasting labs prior to appt

## 2018-07-30 NOTE — Progress Notes (Signed)
Patient ID: Julie Walter, female   DOB: 03/11/1919, 84 y.o.   MRN: [id number]   Location:  [facility]  Provider: DR Arletha Grippe  Code Status:  Goals of Care:  Advanced Directives 06/30/2018  Does Patient Have a Medical Advance Directive? Yes  Type of Advance Directive Out of facility DNR (pink MOST or yellow form)  Copy of [place] in Chart? -  Would patient like information on creating a medical advance directive? -  Pre-existing out of facility DNR order (yellow form or pink MOST form) Yellow form placed in chart (order not valid for inpatient use)     Chief Complaint  Patient presents with  . Medical Management of Chronic Issues    3 month follow on OAB, DM, HTH, and hyperlipidemia. Discuss labs (copy printed) and patient c/o pain in shoulders and back.  . Medication Management    Patient on Myrbetriq, not urinating much  . Immunizations    Discuss flu vaccine   . Medication Refill    Oxycodone   . Screening    Audit C Screening, 2     HPI: Patient is a 84 y.o. female seen today for medical management of chronic diseases.  Unsure about med she takes for OAB (myrbetriq) - it is working a little too well and she has a hard time starting and keeping urine stream. Urine "dribbles" out. She does not drink as much water as she feels. She has chronic pain but rarely takes oxyIR. She walks at least 1-2 times per week. She has b/l shoulder pain - last injection in April 2019 by Dr Ernestina Patches.  DM - diet controlled. A1c 6.3%. Urine microalbumin/Cr ratio 12  Hyperlipidemia - stable on simvastatin. LDL 88; HDL 53  HTN - BP stable on HCTZ.   Hemoconcentration - stable. H/H 15.4/45.2.   Severe AS - 2D echo EF 60-65% with critical AS. Surgery recommended in Spring 2018 but she declined due to c/a severe joint pain. She has dizziness but no SOB or CP. She takes imdur.  Urinary incontinance - improved leakage, urge related. vesicare was ineffective in the past and  caused inability to urinate, dry mouth. mybetriq works.  Chronic left sciatic pain - improved s/p epidural. she tried 6 tx of accupuncture without relief this yr. It resolved after 1 month last year. She has not tried PT (she has b/l THR and had PT then). She reports hx scoliosis.   Chronic joint pain/lumbar radiculopathy - she rec'd epidural in lumbar spine in Jan 2019 by Dr Ernestina Patches. She takes OxyIR 1 tab per day. She takes Aleve prn. She was followed by Ortho in [facility]. She has tried hydrocodone in the past and found it to be more effective than oxycodone. She uses voltaren gel prn.  BET - improved. She took several doses of propanolol but it caused nausea/stomach upset. She has not taken anymore.   Past Medical History:  Diagnosis Date  . Anemia   . Aortic stenosis   . Arthritis   . Breast cancer ([place])   . Carpal tunnel syndrome   . Chronic pain   . Colon cancer ([place])   . Fibromyalgia   . Hematuria   . History of hip replacement    bilateral  . Hyperglycemia   . Hyperlipidemia   . Hypertension   . Lumbago   . Memory loss   . Osteoarthritis   . Osteopenia     Past Surgical History:  Procedure Laterality Date  .  ABDOMINAL HYSTERECTOMY  1958  . BREAST LUMPECTOMY  2008  . CHOLECYSTECTOMY  2002  . colon cancer tumor  1993   Dr. Jens Som  . hip replacements     2013 and 2014     reports that she has quit smoking. She has a 40.00 pack-year smoking history. She has never used smokeless tobacco. She reports that she drinks alcohol. She reports that she does not use drugs. Social History   Socioeconomic History  . Marital status: Widowed    Spouse name: Not on file  . Number of children: Not on file  . Years of education: Not on file  . Highest education level: Not on file  Occupational History  . Not on file  Social Needs  . Financial resource strain: Not hard at all  . Food insecurity:    Worry: Never true    Inability: Never true  . Transportation needs:    Medical: No      Non-medical: No  Tobacco Use  . Smoking status: Former Smoker    Packs/day: 2.00    Years: 20.00    Pack years: 40.00  . Smokeless tobacco: Never Used  . Tobacco comment: quit 25 years ago as of 2019   Substance and Sexual Activity  . Alcohol use: Yes    Comment: 1 glass of wine every couple weeks  . Drug use: No  . Sexual activity: Not Currently  Lifestyle  . Physical activity:    Days per week: 2 days    Minutes per session: 20 min  . Stress: To some extent  Relationships  . Social connections:    Talks on phone: More than three times a week    Gets together: More than three times a week    Attends religious service: Never    Active member of club or organization: No    Attends meetings of clubs or organizations: Never    Relationship status: Widowed  . Intimate partner violence:    Fear of current or ex partner: No    Emotionally abused: No    Physically abused: No    Forced sexual activity: No  Other Topics Concern  . Not on file  Social History Narrative   Social History      Diet? light      Do you drink/eat things with caffeine? Coffee 1/day      Marital status?             widowed                       What year were you married? 1944      Do you live in a house, apartment, assisted living, condo, trailer, etc.? Independent living      Is it one or more stories? 3 stories      How many persons live in your home? 1      Do you have any pets in your home? (please list)  none      Highest level of education completed? High school + secretarial studies post      Current or past profession: Herbalist, Arts administrator      Do you exercise?                 yes                     Type & how often? Walking, back exercises  Advanced Directives      Do you have a living will? yes      Do you have a DNR form?       no                           If not, do you want to discuss one? yes      Do you have signed POA/HPOA for forms? yes       Functional Status      Do you have difficulty bathing or dressing yourself? no      Do you have difficulty preparing food or eating? no      Do you have difficulty managing your medications? no      Do you have difficulty managing your finances? no      Do you have difficulty affording your medications? no    Family History  Problem Relation Age of Onset  . Cancer Mother   . Heart disease Mother   . Cancer Sister   . Cancer Brother   . Cancer Sister   . Heart failure Brother   . Cancer Sister   . Heart failure Brother   . Hemachromatosis Son   . Diabetes Son     No Known Allergies  Outpatient Encounter Medications as of 07/30/2018  Medication Sig  . amoxicillin (AMOXIL) 500 MG capsule Take 500 mg by mouth. 4 before dental work  . diclofenac sodium (VOLTAREN) 1 % GEL Apply topically 2 (two) times daily.  . hydrochlorothiazide (HYDRODIURIL) 25 MG tablet Take 0.5 tablets (12.5 mg total) by mouth daily.  . isosorbide mononitrate (IMDUR) 30 MG 24 hr tablet Take 1 tablet (30 mg total) by mouth daily.  . mirabegron ER (MYRBETRIQ) 25 MG TB24 tablet Take 1 tablet (25 mg total) by mouth daily. For overactive bladder  . Multiple Vitamin (MULTIVITAMIN WITH MINERALS) TABS tablet Take 1 tablet by mouth daily.  . naproxen sodium (ALEVE) 220 MG tablet Take 220 mg by mouth daily as needed.  Marland Kitchen oxyCODONE (OXY IR/ROXICODONE) 5 MG immediate release tablet Take 1 tablet (5 mg total) by mouth every 4 (four) hours as needed for moderate pain or severe pain.  . polyethylene glycol (MIRALAX / GLYCOLAX) packet Take 17 g by mouth daily.   . simvastatin (ZOCOR) 40 MG tablet TAKE 1 TABLET (40 MG TOTAL) DAILY BY MOUTH.  . [DISCONTINUED] cetirizine (ZYRTEC) 5 MG tablet Take 5 mg by mouth daily.  . [DISCONTINUED] docusate sodium (COLACE) 100 MG capsule Take 100 mg by mouth daily.   No facility-administered encounter medications on file as of 07/30/2018.     Review of Systems:  Review of Systems    Respiratory: Negative for shortness of breath.   Cardiovascular: Negative for chest pain and palpitations.  Genitourinary: Positive for difficulty urinating.  Musculoskeletal: Positive for arthralgias, gait problem and joint swelling.  All other systems reviewed and are negative.   Health Maintenance  Topic Date Due  . OPHTHALMOLOGY EXAM  12/10/1930  . DEXA SCAN  12/10/1985  . INFLUENZA VACCINE  06/12/2018  . URINE MICROALBUMIN  10/08/2018  . FOOT EXAM  01/11/2019  . HEMOGLOBIN A1C  01/23/2019  . TETANUS/TDAP  06/30/2028  . PNA vac Low Risk Adult  Completed    Physical Exam: Vitals:   07/30/18 1340  BP: 118/76  Pulse: 72  Temp: 97.9 F (36.6 C)  TempSrc: Oral  SpO2: 95%  Weight: 128 lb (58.1 kg)  Height:  4\' 10"  (1.473 m)   Body mass index is 26.75 kg/m. Physical Exam  Constitutional: She is oriented to person, place, and time. She appears well-developed and well-nourished.  Looks well in NAD  HENT:  Mouth/Throat: Oropharynx is clear and moist. No oropharyngeal exudate.  MMM; no oral thrush; poor dentition lower teeth  Eyes: Pupils are equal, round, and reactive to light. No scleral icterus.  Neck: Neck supple. Carotid bruit is present (systolic b/l). No tracheal deviation present. No thyromegaly present.  Cardiovascular: Normal rate, regular rhythm and intact distal pulses. Exam reveals no gallop and no friction rub.  Murmur (3/6 SEM --> carotid b/l) heard. B/l LE soft varicose veins; no LE edema b/l. No calf TTP  Pulmonary/Chest: Effort normal and breath sounds normal. No stridor. No respiratory distress. She has no wheezes. She has no rales.  Abdominal: Soft. Normal appearance and bowel sounds are normal. She exhibits no distension and no mass. There is no hepatomegaly. There is no tenderness. There is no rigidity, no rebound and no guarding. No hernia.  Musculoskeletal: She exhibits edema (b/l knee crepitus with intact ROM) and tenderness (b/l hip with internal  ROM).  Lymphadenopathy:    She has no cervical adenopathy.  Neurological: She is alert and oriented to person, place, and time. She has normal reflexes.  Skin: Skin is warm and dry. No rash noted.  Psychiatric: She has a normal mood and affect. Her behavior is normal. Judgment and thought content normal.    Labs reviewed: Basic Metabolic Panel: Recent Labs    01/10/18 1420 03/28/18 0302 04/23/18 1520 07/25/18 1025  NA 142 134*  --  145  K 4.4 4.3  --  4.1  CL 101 94*  --  105  CO2 32 27  --  32  GLUCOSE 116 139*  --  119*  BUN 18 18  --  24  CREATININE 0.73 0.80  --  0.81  CALCIUM 9.9 9.4  --  9.6  TSH  --   --  2.58  --    Liver Function Tests: Recent Labs    10/08/17 0956 01/10/18 1420 03/28/18 0302 07/25/18 1025  AST 18 15 24   --   ALT 9 11 13* 8  ALKPHOS  --   --  66  --   BILITOT 1.0 0.8 1.6*  --   PROT 6.4 6.3 7.4  --   ALBUMIN  --   --  4.0  --    No results for input(s): LIPASE, AMYLASE in the last 8760 hours. No results for input(s): AMMONIA in the last 8760 hours. CBC: Recent Labs    10/08/17 0956 03/28/18 0302  WBC 6.5 7.2  NEUTROABS 4,303  --   HGB 15.0 15.4*  HCT 44.4 45.2  MCV 93.7 94.0  PLT 135* 165   Lipid Panel: Recent Labs    10/08/17 0956 01/10/18 1420 07/25/18 1025  CHOL 163 176 167  HDL 55 53 53  LDLCALC 84 93 88  TRIG 141 204* 156*  CHOLHDL 3.0 3.3 3.2   Lab Results  Component Value Date   HGBA1C 6.3 (H) 07/25/2018    Procedures since last visit: No results found.  Assessment/Plan   ICD-10-CM   1. Bilateral leg cramps R25.2    probably multifactorial - varicose veins, dehydration, diuresis  2. Aortic stenosis, severe I35.0   3. Other spondylosis with radiculopathy, lumbar region M47.26 oxyCODONE (OXY IR/ROXICODONE) 5 MG immediate release tablet  4. Type 2 diabetes mellitus without complication, without  long-term current use of insulin (HCC) E11.9   5. Essential hypertension I10   6. Urinary incontinence,  unspecified type R32   7. Need for immunization against influenza Z23 Flu Vaccine QUAD 36+ mos IM   PLEASE TAKE MYRBETRIQ EVERY OTHER DAY - IF URINE STREAM DOES NOT IMPROVE, STOP MEDICATION  Continue other medications as ordered  Drink more water to help reduce leg cramps  Flu shot given today  Follow up in 3 mos with Janett Billow for OAB, AS, DM, hyperlipidemia. Fasting labs prior to appt      [place] S. Perlie Gold  [facility] [address] [place], [place] [address] [phone] Cell (Monday-Friday 8 AM - 5 PM) [phone] After 5 PM and follow prompts

## 2018-08-19 ENCOUNTER — Telehealth: Payer: Self-pay | Admitting: *Deleted

## 2018-08-19 NOTE — Telephone Encounter (Signed)
Does not need Rx at this moment.

## 2018-08-19 NOTE — Telephone Encounter (Signed)
GREAT! Please send rx to pharmacy if she does not have one already

## 2018-08-19 NOTE — Telephone Encounter (Signed)
Caregiver, Anne Ng called and stated you wanted her to call with update. Stated that patient is taking Myrebetriq every other day for Urinary Incontinence and it is working well.

## 2018-08-21 ENCOUNTER — Telehealth (INDEPENDENT_AMBULATORY_CARE_PROVIDER_SITE_OTHER): Payer: Self-pay | Admitting: Orthopedic Surgery

## 2018-08-21 NOTE — Telephone Encounter (Signed)
Please advise 

## 2018-08-21 NOTE — Telephone Encounter (Signed)
Okay to come in for another shot in her shoulder I can do it or Dr. Ernestina Patches can do it however can get her in the quickest for intra-articular injection thanks

## 2018-08-22 NOTE — Telephone Encounter (Signed)
appt scheduled to see Dr Marlou Sa on Monday

## 2018-08-25 ENCOUNTER — Ambulatory Visit (INDEPENDENT_AMBULATORY_CARE_PROVIDER_SITE_OTHER): Payer: Medicare HMO | Admitting: Orthopedic Surgery

## 2018-08-25 ENCOUNTER — Encounter (INDEPENDENT_AMBULATORY_CARE_PROVIDER_SITE_OTHER): Payer: Self-pay | Admitting: Orthopedic Surgery

## 2018-08-25 DIAGNOSIS — M19011 Primary osteoarthritis, right shoulder: Secondary | ICD-10-CM | POA: Diagnosis not present

## 2018-08-25 DIAGNOSIS — M19012 Primary osteoarthritis, left shoulder: Secondary | ICD-10-CM

## 2018-08-25 MED ORDER — METHYLPREDNISOLONE ACETATE 80 MG/ML IJ SUSP
80.0000 mg | INTRAMUSCULAR | Status: AC | PRN
  Administered 2018-08-25: 80 mg via INTRA_ARTICULAR

## 2018-08-25 MED ORDER — LIDOCAINE HCL 1 % IJ SOLN
5.0000 mL | INTRAMUSCULAR | Status: AC | PRN
  Administered 2018-08-25: 5 mL

## 2018-08-25 MED ORDER — BUPIVACAINE HCL 0.5 % IJ SOLN
9.0000 mL | INTRAMUSCULAR | Status: AC | PRN
  Administered 2018-08-25: 9 mL via INTRA_ARTICULAR

## 2018-08-25 NOTE — Progress Notes (Signed)
Office Visit Note   Patient: Julie Walter           Date of Birth: 12-17-1918           MRN: [id number] Visit Date: 08/25/2018 Requested by: Gildardo Cranker, [place], [place] [id number] PCP: Gildardo Cranker, DO  Subjective: Chief Complaint  Patient presents with  . Right Shoulder - Pain  . Left Shoulder - Pain    HPI: Patient presents with bilateral shoulder pain.She has known history of shoulder arthritis.  She had right shoulder injected in April and did well with that.  She also has some back issues.  She denies any interval history of injury to either shoulder but does report some pain with overhead motion.              ROS: All systems reviewed are negative as they relate to the chief complaint within the history of present illness.  Patient denies  fevers or chills.   Assessment & Plan: Visit Diagnoses: No diagnosis found.  Plan: Impression is bilateral shoulder arthritis.  Plan is bilateral shoulder glenohumeral joint injections today.  I think we could repeat these every 4 to 6 months if needed.  I will see her back as needed  Follow-Up Instructions: No follow-ups on file.   Orders:  No orders of the defined types were placed in this encounter.  No orders of the defined types were placed in this encounter.     Procedures: Large Joint Inj: bilateral glenohumeral on 08/25/2018 10:08 AM Indications: diagnostic evaluation and pain Details: 18 G 1.5 in needle, posterior approach  Arthrogram: No  Medications (Right): 5 mL lidocaine 1 %; 9 mL bupivacaine 0.5 %; 80 mg methylPREDNISolone acetate 80 MG/ML Medications (Left): 5 mL lidocaine 1 %; 9 mL bupivacaine 0.5 %; 80 mg methylPREDNISolone acetate 80 MG/ML Outcome: tolerated well, no immediate complications Procedure, treatment alternatives, risks and benefits explained, specific risks discussed. Consent was given by the patient. Immediately prior to procedure a time out was called to verify the  correct patient, procedure, equipment, support staff and site/side marked as required. Patient was prepped and draped in the usual sterile fashion.       Clinical Data: No additional findings.  Objective: Vital Signs: There were no vitals taken for this visit.  Physical Exam:   Constitutional: Patient appears well-developed HEENT:  Head: Normocephalic Eyes:EOM are normal Neck: Normal range of motion Cardiovascular: Normal rate Pulmonary/chest: Effort normal Neurologic: Patient is alert Skin: Skin is warm Psychiatric: Patient has normal mood and affect    Ortho Exam: Ortho exam demonstrates full active and passive range of motion of the elbows and wrists.  She does have some limitation of active forward flexion and abduction but she has both above 90 degrees.  Cuff strength is intact bilaterally.  Motor or sensory function to the hands are intact.  Specialty Comments:  No specialty comments available.  Imaging: No results found.   PMFS History: Patient Active Problem List   Diagnosis Date Noted  . Essential hypertension 01/10/2018  . Mixed hyperlipidemia 01/10/2018  . Multiple joint pain 01/10/2018  . Tremor, essential 01/10/2018  . Left sided sciatica 01/10/2018  . Lumbar radiculopathy 01/10/2018  . Aortic stenosis, severe 01/10/2018  . Type 2 diabetes mellitus without complication, without long-term current use of insulin ([place]) 01/10/2018   Past Medical History:  Diagnosis Date  . Anemia   . Aortic stenosis   . Arthritis   . Breast cancer ([place])   .  Carpal tunnel syndrome   . Chronic pain   . Colon cancer ([place])   . Fibromyalgia   . Hematuria   . History of hip replacement    bilateral  . Hyperglycemia   . Hyperlipidemia   . Hypertension   . Lumbago   . Memory loss   . Osteoarthritis   . Osteopenia     Family History  Problem Relation Age of Onset  . Cancer Mother   . Heart disease Mother   . Cancer Sister   . Cancer Brother   . Cancer Sister    . Heart failure Brother   . Cancer Sister   . Heart failure Brother   . Hemachromatosis Son   . Diabetes Son     Past Surgical History:  Procedure Laterality Date  . ABDOMINAL HYSTERECTOMY  1958  . BREAST LUMPECTOMY  2008  . CHOLECYSTECTOMY  2002  . colon cancer tumor  1993   Dr. Jens Som  . hip replacements     2013 and 2014   Social History   Occupational History  . Not on file  Tobacco Use  . Smoking status: Former Smoker    Packs/day: 2.00    Years: 20.00    Pack years: 40.00  . Smokeless tobacco: Never Used  . Tobacco comment: quit 25 years ago as of 2019   Substance and Sexual Activity  . Alcohol use: Yes    Comment: 1 glass of wine every couple weeks  . Drug use: No  . Sexual activity: Not Currently

## 2018-10-07 ENCOUNTER — Other Ambulatory Visit: Payer: Self-pay | Admitting: *Deleted

## 2018-10-07 DIAGNOSIS — I350 Nonrheumatic aortic (valve) stenosis: Secondary | ICD-10-CM

## 2018-10-07 MED ORDER — ISOSORBIDE MONONITRATE ER 30 MG PO TB24: 30.0000 mg | ORAL_TABLET | Freq: Every day | ORAL | 1 refills | Status: DC

## 2018-10-07 NOTE — Telephone Encounter (Signed)
[facility] 

## 2018-10-22 ENCOUNTER — Other Ambulatory Visit: Payer: Self-pay

## 2018-10-22 DIAGNOSIS — E782 Mixed hyperlipidemia: Secondary | ICD-10-CM

## 2018-10-22 DIAGNOSIS — I10 Essential (primary) hypertension: Secondary | ICD-10-CM

## 2018-10-22 DIAGNOSIS — E119 Type 2 diabetes mellitus without complications: Secondary | ICD-10-CM

## 2018-10-22 DIAGNOSIS — Z79899 Other long term (current) drug therapy: Secondary | ICD-10-CM

## 2018-10-24 ENCOUNTER — Other Ambulatory Visit: Payer: Medicare HMO

## 2018-10-27 ENCOUNTER — Other Ambulatory Visit: Payer: Medicare HMO

## 2018-10-27 DIAGNOSIS — E782 Mixed hyperlipidemia: Secondary | ICD-10-CM

## 2018-10-27 DIAGNOSIS — E119 Type 2 diabetes mellitus without complications: Secondary | ICD-10-CM | POA: Diagnosis not present

## 2018-10-27 DIAGNOSIS — I10 Essential (primary) hypertension: Secondary | ICD-10-CM | POA: Diagnosis not present

## 2018-10-27 DIAGNOSIS — Z79899 Other long term (current) drug therapy: Secondary | ICD-10-CM | POA: Diagnosis not present

## 2018-10-27 LAB — LIPID PANEL
CHOL/HDL RATIO: 3.3 (calc) (ref <5.0)
CHOLESTEROL: 192 mg/dL (ref <200)
HDL: 59 mg/dL (ref >50)
LDL CALC: 104
LDL CHOLESTEROL (CALC): 104 mg/dL — AB
Non-HDL Cholesterol (Calc): 133 mg/dL (calc) — ABNORMAL HIGH (ref <130)
Triglycerides: 172 mg/dL — ABNORMAL HIGH (ref <150)

## 2018-10-27 LAB — COMPLETE METABOLIC PANEL WITH GFR
AG Ratio: 1.9 (calc) (ref 1.0–2.5)
ALKALINE PHOSPHATASE (APISO): 57 U/L (ref 33–130)
ALT: 10 U/L (ref 6–29)
AST: 18 U/L (ref 10–35)
Albumin: 4.2 g/dL (ref 3.6–5.1)
BUN: 21 mg/dL (ref 7–25)
CALCIUM: 10 mg/dL (ref 8.6–10.4)
CO2: 30 mmol/L (ref 20–32)
CREATININE: 0.66 mg/dL (ref 0.60–0.88)
Chloride: 104 mmol/L (ref 98–110)
GFR, Est African American: 86 mL/min/{1.73_m2} (ref >=60)
GFR, Est Non African American: 74 mL/min/{1.73_m2} (ref >=60)
GLOBULIN: 2.2 g/dL (ref 1.9–3.7)
Glucose, Bld: 117 mg/dL — ABNORMAL HIGH (ref 65–99)
POTASSIUM: 3.9 mmol/L (ref 3.5–5.3)
SODIUM: 144 mmol/L (ref 135–146)
Total Bilirubin: 1 mg/dL (ref 0.2–1.2)
Total Protein: 6.4 g/dL (ref 6.1–8.1)

## 2018-10-27 LAB — BASIC METABOLIC PANEL
BUN: 21 (ref 4–21)
CREATININE: 0.7 (ref 0.5–1.1)
Glucose: 117
Sodium: 144 (ref 137–147)

## 2018-10-27 LAB — HEPATIC FUNCTION PANEL
Alkaline Phosphatase: 57 (ref 25–125)
Bilirubin, Total: 1

## 2018-10-27 LAB — HEMOGLOBIN A1C
EAG (MMOL/L): 7.4 (calc)
HEMOGLOBIN A1C: 6.3 %{Hb} — AB (ref <5.7)
MEAN PLASMA GLUCOSE: 134 (calc)

## 2018-10-29 ENCOUNTER — Ambulatory Visit (INDEPENDENT_AMBULATORY_CARE_PROVIDER_SITE_OTHER): Payer: Medicare HMO | Admitting: Nurse Practitioner

## 2018-10-29 ENCOUNTER — Encounter: Payer: Self-pay | Admitting: Nurse Practitioner

## 2018-10-29 VITALS — BP 128/72 | HR 75 | Temp 98.0°F | Ht <= 58 in | Wt 130.4 lb

## 2018-10-29 DIAGNOSIS — I350 Nonrheumatic aortic (valve) stenosis: Secondary | ICD-10-CM | POA: Diagnosis not present

## 2018-10-29 DIAGNOSIS — G250 Essential tremor: Secondary | ICD-10-CM | POA: Diagnosis not present

## 2018-10-29 DIAGNOSIS — E119 Type 2 diabetes mellitus without complications: Secondary | ICD-10-CM | POA: Diagnosis not present

## 2018-10-29 DIAGNOSIS — M2550 Pain in unspecified joint: Secondary | ICD-10-CM | POA: Diagnosis not present

## 2018-10-29 DIAGNOSIS — R339 Retention of urine, unspecified: Secondary | ICD-10-CM | POA: Diagnosis not present

## 2018-10-29 DIAGNOSIS — I10 Essential (primary) hypertension: Secondary | ICD-10-CM | POA: Diagnosis not present

## 2018-10-29 DIAGNOSIS — M5432 Sciatica, left side: Secondary | ICD-10-CM

## 2018-10-29 DIAGNOSIS — E782 Mixed hyperlipidemia: Secondary | ICD-10-CM | POA: Diagnosis not present

## 2018-10-29 DIAGNOSIS — E2839 Other primary ovarian failure: Secondary | ICD-10-CM

## 2018-10-29 LAB — POCT URINALYSIS DIPSTICK
Bilirubin, UA: NEGATIVE
Blood, UA: NEGATIVE
Glucose, UA: NEGATIVE
Ketones, UA: NEGATIVE
Leukocytes, UA: NEGATIVE
Nitrite, UA: NEGATIVE
Odor: NORMAL
Protein, UA: NEGATIVE
SPEC GRAV UA: 1.01 (ref 1.010–1.025)
UROBILINOGEN UA: 0.2 U/dL
pH, UA: 6 (ref 5.0–8.0)

## 2018-10-29 NOTE — Patient Instructions (Addendum)
[facility] Phone: [phone]  [address] [place], [place], [place] [address]  [facility] in [place], [place] Address: [address] [place], [place],  [address] Phone: [phone]

## 2018-10-29 NOTE — Progress Notes (Signed)
Careteam: Patient Care Team: Lauree Chandler, NP as PCP - General (Geriatric Medicine)  Advanced Directive information Does Patient Have a Medical Advance Directive?: Yes, Type of Advance Directive: Out of facility DNR (pink MOST or yellow form), Pre-existing out of facility DNR order (yellow form or pink MOST form): Yellow form placed in chart (order not valid for inpatient use), Does patient want to make changes to medical advance directive?: No - Patient declined  No Known Allergies  Chief Complaint  Patient presents with  . Medical Management of Chronic Issues    3 month follow up, patient stated she feels like she can not empty bladder and is going a lot  . Quality Metric Gaps    Discuss need for eye exam and bone density      HPI: Patient is a 84 y.o. female seen in the office today for routine follow up. States she did not think she would be around this long. Her previous doctor did not give her a good prognosis due to her severe AS.    OAB- taking myrbetriq every other day-  Urine "dribbles" out. Does not feel like she is emptying her bladder enough No pain when she urinates but sometimes get a pain before she has to go. Does not feel like she has an infection.   She has chronic pain but rarely takes oxyIR. She walks at least 1-2 times per week. She has b/l shoulder pain - last injection in April 2019 by Dr Ernestina Patches.  DM - diet controlled. A1c 6.3%. Urine microalbumin/Cr ratio 12  Hyperlipidemia - stable on simvastatin. LDL 104; HDL 59  HTN - BP stable on HCTZ 12.5 mg by mouth daily   Hemoconcentration - stable. H/H 15.4/45.2.   Severe AS - 2D echo EF 60-65% with critical AS. Surgery recommended in Spring 2018 but she declined due to c/a severe joint pain. Reports no dizziness, chest pains but will get short of breath occasionally. She takes imdur.  Urinary incontinance - improved leakage, urge related. Uses pad  Chronic left sciatic pain - improved s/p  epidural. she tried 6 tx of accupuncture without relief this yr. It resolved after 1 month last year. She has not tried PT (she has b/l THR and had PT then). She reports hx scoliosis.   Chronic joint pain/lumbar radiculopathy - she rec'd epidural in lumbar spine in Jan 2019 by Dr Ernestina Patches. She takes OxyIR 1 tab per day. She takes Aleve prn. She was followed by Ortho in [facility]. She uses voltaren gel prn.  Tremor - improved. She took several doses of propanolol but it caused nausea/stomach upset. She has not taken anymore.  Constipation- controlled on mirlax daily    Review of Systems:  Review of Systems  Constitutional: Negative for chills, fever and malaise/fatigue.  HENT: Negative for congestion, ear pain, hearing loss, sinus pain, sore throat and tinnitus.   Eyes: Negative for discharge and redness.  Respiratory: Positive for shortness of breath (occasionally). Negative for cough.   Cardiovascular: Negative for chest pain.  Gastrointestinal: Positive for constipation (controlled on miralax).  Genitourinary: Positive for frequency and urgency.  Musculoskeletal: Positive for joint pain.       Occasional joint pain, none currently  Neurological: Negative for dizziness and headaches.    Past Medical History:  Diagnosis Date  . Anemia   . Aortic stenosis   . Arthritis   . Breast cancer ([place])   . Carpal tunnel syndrome   . Chronic pain   .  Colon cancer ([place])   . Fibromyalgia   . Hematuria   . History of hip replacement    bilateral  . Hyperglycemia   . Hyperlipidemia   . Hypertension   . Lumbago   . Memory loss   . Osteoarthritis   . Osteopenia    Past Surgical History:  Procedure Laterality Date  . ABDOMINAL HYSTERECTOMY  1958  . BREAST LUMPECTOMY  2008  . CHOLECYSTECTOMY  2002  . colon cancer tumor  1993   Dr. Jens Som  . hip replacements     2013 and 2014   Social History:   reports that she has quit smoking. She has a 40.00 pack-year smoking history. She has never  used smokeless tobacco. She reports current alcohol use. She reports that she does not use drugs.  Family History  Problem Relation Age of Onset  . Cancer Mother   . Heart disease Mother   . Cancer Sister   . Cancer Brother   . Cancer Sister   . Heart failure Brother   . Cancer Sister   . Heart failure Brother   . Hemachromatosis Son   . Diabetes Son     Medications: Patient's Medications  New Prescriptions   No medications on file  Previous Medications   AMOXICILLIN (AMOXIL) 500 MG CAPSULE    Take 500 mg by mouth. 4 before dental work   DICLOFENAC SODIUM (VOLTAREN) 1 % GEL    Apply 2 g topically daily.   HYDROCHLOROTHIAZIDE (HYDRODIURIL) 25 MG TABLET    Take 0.5 tablets (12.5 mg total) by mouth daily.   ISOSORBIDE MONONITRATE (IMDUR) 30 MG 24 HR TABLET    Take 1 tablet (30 mg total) by mouth daily.   MIRABEGRON ER (MYRBETRIQ) 25 MG TB24 TABLET    Take 25 mg by mouth every other day.   MULTIPLE VITAMIN (MULTIVITAMIN WITH MINERALS) TABS TABLET    Take 1 tablet by mouth daily.   NAPROXEN SODIUM (ALEVE) 220 MG TABLET    Take 220 mg by mouth daily.    OXYCODONE (OXY IR/ROXICODONE) 5 MG IMMEDIATE RELEASE TABLET    Take 5 mg by mouth as needed for severe pain.   POLYETHYLENE GLYCOL (MIRALAX / GLYCOLAX) PACKET    Take 17 g by mouth daily.    SIMVASTATIN (ZOCOR) 40 MG TABLET    TAKE 1 TABLET (40 MG TOTAL) DAILY BY MOUTH.  Modified Medications   No medications on file  Discontinued Medications   DICLOFENAC SODIUM (VOLTAREN) 1 % GEL    Apply topically 2 (two) times daily.   MIRABEGRON ER (MYRBETRIQ) 25 MG TB24 TABLET    Take 1 tablet (25 mg total) by mouth daily. For overactive bladder   OXYCODONE (OXY IR/ROXICODONE) 5 MG IMMEDIATE RELEASE TABLET    Take 1 tablet (5 mg total) by mouth every 4 (four) hours as needed for moderate pain or severe pain.     Physical Exam:  Vitals:   10/29/18 1444  BP: 128/72  Pulse: 75  Temp: 98 F (36.7 C)  TempSrc: Oral  SpO2: 95%  Weight: 130 lb  6.4 oz (59.1 kg)  Height: 4\' 10"  (1.473 m)   Body mass index is 27.25 kg/m.  Physical Exam Constitutional:      General: She is not in acute distress.    Appearance: She is well-developed. She is not diaphoretic.  HENT:     Head: Normocephalic and atraumatic.     Mouth/Throat:     Pharynx: No oropharyngeal exudate.  Eyes:     Conjunctiva/sclera: Conjunctivae normal.     Pupils: Pupils are equal, round, and reactive to light.  Neck:     Musculoskeletal: Normal range of motion and neck supple.  Cardiovascular:     Rate and Rhythm: Normal rate and regular rhythm.     Heart sounds: Murmur (3/6) present.  Pulmonary:     Effort: Pulmonary effort is normal.     Breath sounds: Normal breath sounds.  Abdominal:     General: Bowel sounds are normal.     Palpations: Abdomen is soft.  Musculoskeletal:        General: No tenderness or deformity.  Skin:    General: Skin is warm and dry.  Neurological:     Mental Status: She is alert and oriented to person, place, and time.     Labs reviewed: Basic Metabolic Panel: Recent Labs    03/28/18 0302 04/23/18 1520 07/25/18 1025 10/27/18 10/27/18 1056  NA 134*  --  145 144 144  K 4.3  --  4.1  --  3.9  CL 94*  --  105  --  104  CO2 27  --  32  --  30  GLUCOSE 139*  --  119*  --  117*  BUN 18  --  24 21 21   CREATININE 0.80  --  0.81 0.7 0.66  CALCIUM 9.4  --  9.6  --  10.0  TSH  --  2.58  --   --   --    Liver Function Tests: Recent Labs    01/10/18 1420 03/28/18 0302 07/25/18 1025 10/27/18 10/27/18 1056  AST 15 24  --   --  18  ALT 11 13* 8  --  10  ALKPHOS  --  66  --  57  --   BILITOT 0.8 1.6*  --   --  1.0  PROT 6.3 7.4  --   --  6.4  ALBUMIN  --  4.0  --   --   --    No results for input(s): LIPASE, AMYLASE in the last 8760 hours. No results for input(s): AMMONIA in the last 8760 hours. CBC: Recent Labs    03/28/18 0302  WBC 7.2  HGB 15.4*  HCT 45.2  MCV 94.0  PLT 165   Lipid Panel: Recent Labs     01/10/18 1420 07/25/18 1025 10/27/18 10/27/18 1056  CHOL 176 167  --  192  HDL 53 53  --  59  LDLCALC 93 88 104 104*  TRIG 204* 156*  --  172*  CHOLHDL 3.3 3.2  --  3.3   TSH: Recent Labs    04/23/18 1520  TSH 2.58   A1C: Lab Results  Component Value Date   HGBA1C 6.3 (H) 10/27/2018     Assessment/Plan 1. Tremor, essential Improved off medication at this time.   2. Mixed hyperlipidemia LDL stable at 104 - Lipid Panel; Future - COMPLETE METABOLIC PANEL WITH GFR; Future  3. Multiple joint pain Stable on current regimen.   4. Left sided sciatica Continues on oxyIR with aleve PRN, uses rarely  5. Type 2 diabetes mellitus without complication, without long-term current use of insulin (HCC) -diet controlled  - Microalbumin, urine - Hemoglobin A1c; Future - CBC with Differential/Platelets; Future  6. Essential hypertension -controlled on HCTZ  7. Aortic stenosis, severe -stable, without worsening of symptoms at this time.   8. Estrogen deficiency - DG Bone Density; Future  9. Incomplete emptying of  bladder -feeling that she is unable to empty bladder which has been ongoing UA negative at this time. Will refer for urology for further evaluation  - POC Urinalysis Dipstick - Ambulatory referral to Urology  Next appt: 4 months.  Carlos American. [place], [facility] [phone]

## 2018-10-30 LAB — MICROALBUMIN, URINE: Microalb, Ur: 0.3 mg/dL

## 2018-11-10 ENCOUNTER — Other Ambulatory Visit: Payer: Self-pay

## 2018-11-10 NOTE — Telephone Encounter (Signed)
RX not showing as filled at [facility] before

## 2018-11-10 NOTE — Telephone Encounter (Signed)
[facility] pharmacy sent request for refill on Diclofenac Sodium 1% Gel. Please advise.

## 2018-11-21 ENCOUNTER — Other Ambulatory Visit: Payer: Self-pay | Admitting: *Deleted

## 2018-11-21 MED ORDER — DICLOFENAC SODIUM 1 % TD GEL: 2.0000 g | Freq: Every day | TRANSDERMAL | 0 refills | Status: DC

## 2018-11-21 NOTE — Telephone Encounter (Signed)
Daughter requested Rx. Faxed to pharmacy.

## 2018-11-25 ENCOUNTER — Telehealth: Payer: Self-pay | Admitting: *Deleted

## 2018-11-25 NOTE — Telephone Encounter (Signed)
Patient daughter, Anne Ng called and stated that the Myrebetriq tier level changed and now her medication is going to be $400/month and they cannot afford this. Stated that formulary alternative is: -Oxybutunin Chloride ER 5, 10 or 15mg  -Oxybutynin Chloride Tab or Syrup -Tobiaz -Trostium Chloride ER Cap or Tab.   Please Advise.

## 2018-11-25 NOTE — Telephone Encounter (Signed)
Will forward to [facility]

## 2018-11-26 NOTE — Telephone Encounter (Signed)
[facility] [phone] and spoke with Alyse Low for Tier Exception. APPROVED through 11/12/2019 for lower tier.   Called daughter to inform. LMOM for daughter to [facility].

## 2018-11-26 NOTE — Telephone Encounter (Signed)
Patient daughter notified and agreed.  

## 2018-11-26 NOTE — Telephone Encounter (Signed)
Due to side effects I really would not like to change her to any of these. Can we do a prior authorization for this?

## 2018-12-17 ENCOUNTER — Telehealth (INDEPENDENT_AMBULATORY_CARE_PROVIDER_SITE_OTHER): Payer: Self-pay | Admitting: Orthopedic Surgery

## 2018-12-17 NOTE — Telephone Encounter (Signed)
Ok to schedule with me pls call thx

## 2018-12-17 NOTE — Telephone Encounter (Signed)
Can we schedule this patient with Marlou Sa? Marlou Sa wants to see her first before he sends to [facility]

## 2018-12-17 NOTE — Telephone Encounter (Signed)
See message, please advise.

## 2018-12-17 NOTE — Telephone Encounter (Signed)
You patient.  See message.

## 2018-12-18 NOTE — Telephone Encounter (Signed)
Patient is scheduled with Dr Marlou Sa 12/26/2018 at 9:45am

## 2018-12-26 ENCOUNTER — Ambulatory Visit (INDEPENDENT_AMBULATORY_CARE_PROVIDER_SITE_OTHER): Payer: Medicare HMO | Admitting: Orthopedic Surgery

## 2018-12-26 ENCOUNTER — Ambulatory Visit (INDEPENDENT_AMBULATORY_CARE_PROVIDER_SITE_OTHER): Payer: Self-pay

## 2018-12-26 DIAGNOSIS — M19011 Primary osteoarthritis, right shoulder: Secondary | ICD-10-CM

## 2018-12-26 DIAGNOSIS — M19012 Primary osteoarthritis, left shoulder: Secondary | ICD-10-CM

## 2018-12-26 DIAGNOSIS — M542 Cervicalgia: Secondary | ICD-10-CM

## 2018-12-27 ENCOUNTER — Encounter (INDEPENDENT_AMBULATORY_CARE_PROVIDER_SITE_OTHER): Payer: Self-pay | Admitting: Orthopedic Surgery

## 2018-12-27 DIAGNOSIS — M19012 Primary osteoarthritis, left shoulder: Secondary | ICD-10-CM

## 2018-12-27 DIAGNOSIS — M542 Cervicalgia: Secondary | ICD-10-CM | POA: Diagnosis not present

## 2018-12-27 DIAGNOSIS — M19011 Primary osteoarthritis, right shoulder: Secondary | ICD-10-CM | POA: Diagnosis not present

## 2018-12-27 MED ORDER — BUPIVACAINE HCL 0.5 % IJ SOLN
9.0000 mL | INTRAMUSCULAR | Status: AC | PRN
  Administered 2018-12-27: 9 mL via INTRA_ARTICULAR

## 2018-12-27 MED ORDER — LIDOCAINE HCL 1 % IJ SOLN
5.0000 mL | INTRAMUSCULAR | Status: AC | PRN
  Administered 2018-12-27: 5 mL

## 2018-12-27 MED ORDER — METHYLPREDNISOLONE ACETATE 40 MG/ML IJ SUSP
40.0000 mg | INTRAMUSCULAR | Status: AC | PRN
  Administered 2018-12-27: 40 mg via INTRA_ARTICULAR

## 2018-12-27 NOTE — Progress Notes (Signed)
Office Visit Note   Patient: Julie Walter           Date of Birth: [birth year]           MRN: [id number] Visit Date: 12/26/2018 Requested by: Lauree Chandler, NP [place], [place] [address] PCP: Lauree Chandler, NP  Subjective: Chief Complaint  Patient presents with  . Left Shoulder - Pain  . Right Shoulder - Pain    HPI: Patient presents with bilateral shoulder pain.  She is having pain in both shoulders.  No prior history of surgery or injury.  She is had both shoulders injected last year with about 1 or 2 months of relief.  Using topical gel and Tylenol.  She has a known history of bilateral shoulder arthritis which is pretty severe.  The pain is radiating down to the hands at times.  Just reports pain but no numbness and tingling.  No interval history of injury.              ROS: All systems reviewed are negative as they relate to the chief complaint within the history of present illness.  Patient denies  fevers or chills.   Assessment & Plan: Visit Diagnoses:  1. Neck pain   2. Bilateral shoulder region arthritis     Plan: Impression is bilateral shoulder arthritis with some arthritis in the neck as well but most of her symptoms today appear to be shoulder arthritis related.  Plan is bilateral shoulder injections with cortisone.  Discussed off label use of Synvisc in the shoulder for pain relief as well and they will consider that option.  I would want to get that done under fluoroscopy.  I will see them back as needed.  Follow-Up Instructions: Return if symptoms worsen or fail to improve.   Orders:  Orders Placed This Encounter  Procedures  . XR Cervical Spine 2 or 3 views   No orders of the defined types were placed in this encounter.     Procedures: Large Joint Inj: bilateral glenohumeral on 12/27/2018 8:33 AM Indications: diagnostic evaluation and pain Details: 18 G 1.5 in needle, posterior approach  Arthrogram: No  Medications (Right):  5 mL lidocaine 1 %; 40 mg methylPREDNISolone acetate 40 MG/ML; 9 mL bupivacaine 0.5 % Medications (Left): 5 mL lidocaine 1 %; 40 mg methylPREDNISolone acetate 40 MG/ML; 9 mL bupivacaine 0.5 % Outcome: tolerated well, no immediate complications Procedure, treatment alternatives, risks and benefits explained, specific risks discussed. Consent was given by the patient. Immediately prior to procedure a time out was called to verify the correct patient, procedure, equipment, support staff and site/side marked as required. Patient was prepped and draped in the usual sterile fashion.       Clinical Data: No additional findings.  Objective: Vital Signs: There were no vitals taken for this visit.  Physical Exam:   Constitutional: Patient appears well-developed HEENT:  Head: Normocephalic Eyes:EOM are normal Neck: Normal range of motion Cardiovascular: Normal rate Pulmonary/chest: Effort normal Neurologic: Patient is alert Skin: Skin is warm Psychiatric: Patient has normal mood and affect    Ortho Exam: Ortho exam demonstrates fairly reasonable cervical spine range of motion with about 30 of extension and flexion chin to chest.  Rotation is about 45 degrees bilaterally.  She has reasonable grip EPL FPL interosseous wrist flexion extension bicep triceps and deltoid strength.  Does have a fair amount of pain with range of motion passively of the shoulder with diminished external rotation bilaterally to  only about 15 or 20 degrees.  Radial pulses intact bilaterally.  No other masses lymphadenopathy or skin changes noted in the shoulder girdle regions.  Specialty Comments:  No specialty comments available.  Imaging: Xr Cervical Spine 2 Or 3 Views  Result Date: 12/27/2018 AP lateral cervical spine reviewed.  Significant degenerative disc disease is present at C4-5 and C5-6 with anterior osteophytes present.  There is loss of lordosis and osteopenia.  No acute fracture.    PMFS  History: Patient Active Problem List   Diagnosis Date Noted  . Essential hypertension 01/10/2018  . Mixed hyperlipidemia 01/10/2018  . Multiple joint pain 01/10/2018  . Tremor, essential 01/10/2018  . Left sided sciatica 01/10/2018  . Lumbar radiculopathy 01/10/2018  . Aortic stenosis, severe 01/10/2018  . Type 2 diabetes mellitus without complication, without long-term current use of insulin ([place]) 01/10/2018   Past Medical History:  Diagnosis Date  . Anemia   . Aortic stenosis   . Arthritis   . Breast cancer ([place])   . Carpal tunnel syndrome   . Chronic pain   . Colon cancer ([place])   . Fibromyalgia   . Hematuria   . History of hip replacement    bilateral  . Hyperglycemia   . Hyperlipidemia   . Hypertension   . Lumbago   . Memory loss   . Osteoarthritis   . Osteopenia     Family History  Problem Relation Age of Onset  . Cancer Mother   . Heart disease Mother   . Cancer Sister   . Cancer Brother   . Cancer Sister   . Heart failure Brother   . Cancer Sister   . Heart failure Brother   . Hemachromatosis Son   . Diabetes Son     Past Surgical History:  Procedure Laterality Date  . ABDOMINAL HYSTERECTOMY  1958  . BREAST LUMPECTOMY  2008  . CHOLECYSTECTOMY  2002  . colon cancer tumor  1993   Dr. Jens Som  . hip replacements     2013 and 2014   Social History   Occupational History  . Not on file  Tobacco Use  . Smoking status: Former Smoker    Packs/day: 2.00    Years: 20.00    Pack years: 40.00  . Smokeless tobacco: Never Used  . Tobacco comment: quit 25 years ago as of 2019   Substance and Sexual Activity  . Alcohol use: Yes    Comment: 1 glass of wine every couple weeks  . Drug use: No  . Sexual activity: Not Currently

## 2018-12-29 ENCOUNTER — Ambulatory Visit: Payer: Self-pay

## 2018-12-29 ENCOUNTER — Encounter: Payer: Medicare HMO | Admitting: Family

## 2018-12-30 ENCOUNTER — Encounter: Payer: Self-pay | Admitting: Family

## 2018-12-30 ENCOUNTER — Ambulatory Visit (INDEPENDENT_AMBULATORY_CARE_PROVIDER_SITE_OTHER): Payer: Medicare HMO | Admitting: Family

## 2018-12-30 VITALS — BP 130/78 | HR 74 | Temp 97.6°F | Ht <= 58 in | Wt 128.6 lb

## 2018-12-30 DIAGNOSIS — Z0000 Encounter for general adult medical examination without abnormal findings: Secondary | ICD-10-CM | POA: Diagnosis not present

## 2018-12-30 NOTE — Patient Instructions (Signed)
Julie Walter , Thank you for taking time to come for your Medicare Wellness Visit. I appreciate your ongoing commitment to your health goals. Please review the following plan we discussed and let me know if I can assist you in the future.   Screening recommendations/referrals: Colonoscopy: N/A  Mammogram: N/A  Bone Density: Had 8 years ago  Recommended yearly ophthalmology/optometry visit for glaucoma screening and checkup Recommended yearly dental visit for hygiene and checkup  Vaccinations: Influenza vaccine: up to date  Pneumococcal vaccine: Up to date  Tdap vaccine: Due 06/30/2028 Shingles vaccine:schedule to get vaccine today at the pharmacy   Advanced directives:copy given   Conditions/risks identified: Advance age, Hypertension and Hx of smoking   Next appointment: 1 year    Preventive Care 81 Years and Older, Female Preventive care refers to lifestyle choices and visits with your health care provider that can promote health and wellness. What does preventive care include?  A yearly physical exam. This is also called an annual well check.  Dental exams once or twice a year.  Routine eye exams. Ask your health care provider how often you should have your eyes checked.  Personal lifestyle choices, including:  Daily care of your teeth and gums.  Regular physical activity.  Eating a healthy diet.  Avoiding tobacco and drug use.  Limiting alcohol use.  Practicing safe sex.  Taking low-dose aspirin every day.  Taking vitamin and mineral supplements as recommended by your health care provider. What happens during an annual well check? The services and screenings done by your health care provider during your annual well check will depend on your age, overall health, lifestyle risk factors, and family history of disease. Counseling  Your health care provider may ask you questions about your:  Alcohol use.  Tobacco use.  Drug use.  Emotional well-being.  Home  and relationship well-being.  Sexual activity.  Eating habits.  History of falls.  Memory and ability to understand (cognition).  Work and work Statistician.  Reproductive health. Screening  You may have the following tests or measurements:  Height, weight, and BMI.  Blood pressure.  Lipid and cholesterol levels. These may be checked every 5 years, or more frequently if you are over 74 years old.  Skin check.  Lung cancer screening. You may have this screening every year starting at age 59 if you have a 30-pack-year history of smoking and currently smoke or have quit within the past 15 years.  Fecal occult blood test (FOBT) of the stool. You may have this test every year starting at age 74.  Flexible sigmoidoscopy or colonoscopy. You may have a sigmoidoscopy every 5 years or a colonoscopy every 10 years starting at age 50.  Hepatitis C blood test.  Hepatitis B blood test.  Sexually transmitted disease (STD) testing.  Diabetes screening. This is done by checking your blood sugar (glucose) after you have not eaten for a while (fasting). You may have this done every 1-3 years.  Bone density scan. This is done to screen for osteoporosis. You may have this done starting at age 22.  Mammogram. This may be done every 1-2 years. Talk to your health care provider about how often you should have regular mammograms. Talk with your health care provider about your test results, treatment options, and if necessary, the need for more tests. Vaccines  Your health care provider may recommend certain vaccines, such as:  Influenza vaccine. This is recommended every year.  Tetanus, diphtheria, and acellular pertussis (Tdap,  Td) vaccine. You may need a Td booster every 10 years.  Zoster vaccine. You may need this after age 41.  Pneumococcal 13-valent conjugate (PCV13) vaccine. One dose is recommended after age 29.  Pneumococcal polysaccharide (PPSV23) vaccine. One dose is recommended  after age 19. Talk to your health care provider about which screenings and vaccines you need and how often you need them. This information is not intended to replace advice given to you by your health care provider. Make sure you discuss any questions you have with your health care provider. Document Released: 11/25/2015 Document Revised: 07/18/2016 Document Reviewed: 08/30/2015 Elsevier Interactive Patient Education  2017 [employer] Prevention in the Home Falls can cause injuries. They can happen to people of all ages. There are many things you can do to make your home safe and to help prevent falls. What can I do on the outside of my home?  Regularly fix the edges of walkways and driveways and fix any cracks.  Remove anything that might make you trip as you walk through a door, such as a raised step or threshold.  Trim any bushes or trees on the path to your home.  Use bright outdoor lighting.  Clear any walking paths of anything that might make someone trip, such as rocks or tools.  Regularly check to see if handrails are loose or broken. Make sure that both sides of any steps have handrails.  Any raised decks and porches should have guardrails on the edges.  Have any leaves, snow, or ice cleared regularly.  Use sand or salt on walking paths during winter.  Clean up any spills in your garage right away. This includes oil or grease spills. What can I do in the bathroom?  Use night lights.  Install grab bars by the toilet and in the tub and shower. Do not use towel bars as grab bars.  Use non-skid mats or decals in the tub or shower.  If you need to sit down in the shower, use a plastic, non-slip stool.  Keep the floor dry. Clean up any water that spills on the floor as soon as it happens.  Remove soap buildup in the tub or shower regularly.  Attach bath mats securely with double-sided non-slip rug tape.  Do not have throw rugs and other things on the floor  that can make you trip. What can I do in the bedroom?  Use night lights.  Make sure that you have a light by your bed that is easy to reach.  Do not use any sheets or blankets that are too big for your bed. They should not hang down onto the floor.  Have a firm chair that has side arms. You can use this for support while you get dressed.  Do not have throw rugs and other things on the floor that can make you trip. What can I do in the kitchen?  Clean up any spills right away.  Avoid walking on wet floors.  Keep items that you use a lot in easy-to-reach places.  If you need to reach something above you, use a strong step stool that has a grab bar.  Keep electrical cords out of the way.  Do not use floor polish or wax that makes floors slippery. If you must use wax, use non-skid floor wax.  Do not have throw rugs and other things on the floor that can make you trip. What can I do with my stairs?  Do not  leave any items on the stairs.  Make sure that there are handrails on both sides of the stairs and use them. Fix handrails that are broken or loose. Make sure that handrails are as long as the stairways.  Check any carpeting to make sure that it is firmly attached to the stairs. Fix any carpet that is loose or worn.  Avoid having throw rugs at the top or bottom of the stairs. If you do have throw rugs, attach them to the floor with carpet tape.  Make sure that you have a light switch at the top of the stairs and the bottom of the stairs. If you do not have them, ask someone to add them for you. What else can I do to help prevent falls?  Wear shoes that:  Do not have high heels.  Have rubber bottoms.  Are comfortable and fit you well.  Are closed at the toe. Do not wear sandals.  If you use a stepladder:  Make sure that it is fully opened. Do not climb a closed stepladder.  Make sure that both sides of the stepladder are locked into place.  Ask someone to hold it  for you, if possible.  Clearly mark and make sure that you can see:  Any grab bars or handrails.  First and last steps.  Where the edge of each step is.  Use tools that help you move around (mobility aids) if they are needed. These include:  Canes.  Walkers.  Scooters.  Crutches.  Turn on the lights when you go into a dark area. Replace any light bulbs as soon as they burn out.  Set up your furniture so you have a clear path. Avoid moving your furniture around.  If any of your floors are uneven, fix them.  If there are any pets around you, be aware of where they are.  Review your medicines with your doctor. Some medicines can make you feel dizzy. This can increase your chance of falling. Ask your doctor what other things that you can do to help prevent falls. This information is not intended to replace advice given to you by your health care provider. Make sure you discuss any questions you have with your health care provider. Document Released: 08/25/2009 Document Revised: 04/05/2016 Document Reviewed: 12/03/2014 Elsevier Interactive Patient Education  2017 [employer].

## 2018-12-30 NOTE — Progress Notes (Signed)
Subjective:   Julie Walter is a 85 y.o. female who presents for Medicare Annual (Subsequent) preventive examination.  Review of Systems:   Cardiac Risk Factors include: advanced age (>25men, >80 women);hypertension;smoking/ tobacco exposure     Objective:     Vitals: BP 130/78   Pulse 74   Temp 97.6 F (36.4 C) (Oral)   Ht 4\' 10"  (1.473 m)   Wt 128 lb 9.6 oz (58.3 kg)   SpO2 93%   BMI 26.88 kg/m   Body mass index is 26.88 kg/m.  Advanced Directives 12/30/2018 10/29/2018 06/30/2018 12/25/2017 10/09/2017 09/25/2017  Does Patient Have a Medical Advance Directive? Yes Yes Yes No Yes Yes  Type of Advance Directive Out of facility DNR (pink MOST or yellow form) Out of facility DNR (pink MOST or yellow form) Out of facility DNR (pink MOST or yellow form) - Press photographer;Living will [place];Living will  Does patient want to make changes to medical advance directive? No - Patient declined No - Patient declined - - - -  Copy of Press photographer in Chart? - - - - No - copy requested No - copy requested  Would patient like information on creating a medical advance directive? - - - Yes (MAU/Ambulatory/Procedural Areas - Information given) - -  Pre-existing out of facility DNR order (yellow form or pink MOST form) Yellow form placed in chart (order not valid for inpatient use) Yellow form placed in chart (order not valid for inpatient use) Yellow form placed in chart (order not valid for inpatient use) - - -    Tobacco Social History   Tobacco Use  Smoking Status Former Smoker  . Packs/day: 2.00  . Years: 20.00  . Pack years: 40.00  Smokeless Tobacco Never Used  Tobacco Comment   quit 25 years ago as of 2019      Counseling given: Not Answered Comment: quit 25 years ago as of 2019    Clinical Intake:  Pre-visit preparation completed: No  Pain : 0-10 Pain Score: 4  Pain Type: Chronic pain Pain Location: Back(shoulder ) Pain  Orientation: Lower Pain Radiating Towards: no  Pain Descriptors / Indicators: Constant Pain Onset: More than a month ago Pain Frequency: Constant Pain Relieving Factors: Heat pad,Asper creme,volataren gel,Tylenol,Oxycontin  Effect of Pain on Daily Activities: Movement   Pain Relieving Factors: Heat pad,Asper creme,volataren gel,Tylenol,Oxycontin   BMI - recorded: 26.88 Nutritional Status: BMI 25 -29 Overweight Nutritional Risks: None Diabetes: No  How often do you need to have someone help you when you read instructions, pamphlets, or other written materials from your doctor or pharmacy?: 1 - Never What is the last grade level you completed in school?: colllege   Interpreter Needed?: No  Information entered by ::   FNP-C   Past Medical History:  Diagnosis Date  . Anemia   . Aortic stenosis   . Arthritis   . Breast cancer ([place])   . Carpal tunnel syndrome   . Chronic pain   . Colon cancer ([place])   . Fibromyalgia   . Hematuria   . History of hip replacement    bilateral  . Hyperglycemia   . Hyperlipidemia   . Hypertension   . Lumbago   . Memory loss   . Osteoarthritis   . Osteopenia    Past Surgical History:  Procedure Laterality Date  . ABDOMINAL HYSTERECTOMY  1958  . BREAST LUMPECTOMY  2008  . CHOLECYSTECTOMY  2002  . colon cancer  tumor  1993   Dr. Jens Som  . hip replacements     2013 and 2014   Family History  Problem Relation Age of Onset  . Cancer Mother   . Heart disease Mother   . Cancer Sister   . Cancer Brother   . Cancer Sister   . Heart failure Brother   . Cancer Sister   . Heart failure Brother   . Hemachromatosis Son   . Diabetes Son    Social History   Socioeconomic History  . Marital status: Widowed    Spouse name: Not on file  . Number of children: Not on file  . Years of education: Not on file  . Highest education level: Not on file  Occupational History  . Not on file  Social Needs  . Financial resource strain: Not  hard at all  . Food insecurity:    Worry: Never true    Inability: Never true  . Transportation needs:    Medical: No    Non-medical: No  Tobacco Use  . Smoking status: Former Smoker    Packs/day: 2.00    Years: 20.00    Pack years: 40.00  . Smokeless tobacco: Never Used  . Tobacco comment: quit 25 years ago as of 2019   Substance and Sexual Activity  . Alcohol use: Yes    Comment: 1 glass of wine every couple weeks  . Drug use: No  . Sexual activity: Not Currently  Lifestyle  . Physical activity:    Days per week: 2 days    Minutes per session: 20 min  . Stress: To some extent  Relationships  . Social connections:    Talks on phone: More than three times a week    Gets together: More than three times a week    Attends religious service: Never    Active member of club or organization: No    Attends meetings of clubs or organizations: Never    Relationship status: Widowed  Other Topics Concern  . Not on file  Social History Narrative   Social History      Diet? light      Do you drink/eat things with caffeine? Coffee 1/day      Marital status?             widowed                       What year were you married? 1944      Do you live in a house, apartment, assisted living, condo, trailer, etc.? Independent living      Is it one or more stories? 3 stories      How many persons live in your home? 1      Do you have any pets in your home? (please list)  none      Highest level of education completed? High school + secretarial studies post      Current or past profession: Herbalist, Arts administrator      Do you exercise?                 yes                     Type & how often? Walking, back exercises      Advanced Directives      Do you have a living will? yes      Do you have a DNR form?  no                           If not, do you want to discuss one? yes      Do you have signed POA/HPOA for forms? yes      Functional Status      Do you  have difficulty bathing or dressing yourself? no      Do you have difficulty preparing food or eating? no      Do you have difficulty managing your medications? no      Do you have difficulty managing your finances? no      Do you have difficulty affording your medications? no    Outpatient Encounter Medications as of 12/30/2018  Medication Sig  . Acetaminophen (TYLENOL) 325 MG CAPS Take 1 tablet by mouth as needed.  . diclofenac sodium (VOLTAREN) 1 % GEL Apply 2 g topically daily.  . hydrochlorothiazide (HYDRODIURIL) 25 MG tablet Take 0.5 tablets (12.5 mg total) by mouth daily.  . isosorbide mononitrate (IMDUR) 30 MG 24 hr tablet Take 1 tablet (30 mg total) by mouth daily.  . mirabegron ER (MYRBETRIQ) 25 MG TB24 tablet Take 25 mg by mouth every other day.  . Multiple Vitamin (MULTIVITAMIN WITH MINERALS) TABS tablet Take 1 tablet by mouth daily.  Marland Kitchen oxyCODONE (OXY IR/ROXICODONE) 5 MG immediate release tablet Take 5 mg by mouth as needed for severe pain.  . polyethylene glycol (MIRALAX / GLYCOLAX) packet Take 17 g by mouth daily.   . simvastatin (ZOCOR) 40 MG tablet TAKE 1 TABLET (40 MG TOTAL) DAILY BY MOUTH.  . [DISCONTINUED] naproxen sodium (ALEVE) 220 MG tablet Take 220 mg by mouth daily.    No facility-administered encounter medications on file as of 12/30/2018.     Activities of Daily Living In your present state of health, do you have any difficulty performing the following activities: 12/30/2018  Hearing? Y  Vision? Y  Difficulty concentrating or making decisions? N  Walking or climbing stairs? N  Dressing or bathing? N  Doing errands, shopping? Y  Comment daughter drives   Conservation officer, nature and eating ? N  Using the Toilet? N  In the past six months, have you accidently leaked urine? Y  Do you have problems with loss of bowel control? N  Managing your Medications? N  Managing your Finances? N  Housekeeping or managing your Housekeeping? Y  Comment needs assistance with some  chores   Some recent data might be hidden    Patient Care Team: Lauree Chandler, NP as PCP - General (Geriatric Medicine)    Assessment:   This is a routine wellness examination for [place].  Exercise Activities and Dietary recommendations Current Exercise Habits: The patient does not participate in regular exercise at present, Exercise limited by: Other - see comments(back,sciatic pain and shoulder pain )  Goals   None     Fall Risk Fall Risk  12/30/2018 07/30/2018 06/30/2018 04/23/2018 01/10/2018  Falls in the past year? 0 Yes No Yes Yes  Number falls in past yr: 0 1 - 2 or more 1  Injury with Fall? 0 No - No Yes  Comment - - - - -   Is the patient's home free of loose throw rugs in walkways, pet beds, electrical cords, etc?   no      Grab bars in the bathroom? yes      Handrails on the stairs?   yes  Adequate lighting?   yes  Depression Screen PHQ 2/9 Scores 12/30/2018 12/25/2017  PHQ - 2 Score 0 4  PHQ- 9 Score - 12     Cognitive Function MMSE - Mini Mental State Exam 12/30/2018 12/25/2017  Orientation to time 5 5  Orientation to Place 5 4  Registration 3 3  Attention/ Calculation 5 5  Recall 3 2  Language- name 2 objects 2 2  Language- repeat 1 1  Language- follow 3 step command 2 3  Language- read & follow direction 1 1  Write a sentence 1 1  Copy design 1 1  Total score 29 28        Immunization History  Administered Date(s) Administered  . Influenza,inj,Quad PF,6+ Mos 08/30/2017, 07/30/2018  . Influenza-Unspecified 08/21/2016, 07/12/2017  . Pneumococcal Conjugate-13 04/06/2014  . Pneumococcal Polysaccharide-23 11/01/2016  . Tdap 06/30/2018    Qualifies for Shingles Vaccine? Will get vaccine at the pharmacy today.   Screening Tests Health Maintenance  Topic Date Due  . OPHTHALMOLOGY EXAM  12/10/1930  . DEXA SCAN  12/10/1985  . FOOT EXAM  01/11/2019  . HEMOGLOBIN A1C  04/28/2019  . URINE MICROALBUMIN  10/30/2019  . TETANUS/TDAP  06/30/2028   . INFLUENZA VACCINE  Completed  . PNA vac Low Risk Adult  Completed    Cancer Screenings: Lung: Low Dose CT Chest recommended if Age 3-80 years, 30 pack-year currently smoking OR have quit w/in 15years. Patient does not qualify. Breast:  Up to date on Mammogram? N/A   Up to date of Bone Density/Dexa? 8 years ago per patient Osteopenia.   Colorectal:Declined   Additional Screenings: Hepatitis C Screening: Decline done in the past.      Plan:   - Dexa scan  - Dental  - Ophthalmology   I have personally reviewed and noted the following in the patient's chart:   . Medical and social history . Use of alcohol, tobacco or illicit drugs  . Current medications and supplements . Functional ability and status . Nutritional status . Physical activity . Advanced directives . List of other physicians . Hospitalizations, surgeries, and ER visits in previous 12 months . Vitals . Screenings to include cognitive, depression, and falls . Referrals and appointments  In addition, I have reviewed and discussed with patient certain preventive protocols, quality metrics, and best practice recommendations. A written personalized care plan for preventive services as well as general preventive health recommendations were provided to patient.    Sandrea Hughs, NP  12/30/2018

## 2019-01-25 ENCOUNTER — Other Ambulatory Visit: Payer: Self-pay | Admitting: Nurse Practitioner

## 2019-01-26 ENCOUNTER — Other Ambulatory Visit: Payer: Self-pay | Admitting: *Deleted

## 2019-01-26 ENCOUNTER — Telehealth: Payer: Self-pay

## 2019-01-26 DIAGNOSIS — I10 Essential (primary) hypertension: Secondary | ICD-10-CM

## 2019-01-26 DIAGNOSIS — E782 Mixed hyperlipidemia: Secondary | ICD-10-CM

## 2019-01-26 MED ORDER — SIMVASTATIN 40 MG PO TABS: 40.0000 mg | ORAL_TABLET | Freq: Every day | ORAL | 0 refills | Status: DC

## 2019-01-26 MED ORDER — HYDROCHLOROTHIAZIDE 25 MG PO TABS: 12.5000 mg | ORAL_TABLET | Freq: Every day | ORAL | 0 refills | Status: DC

## 2019-01-26 NOTE — Telephone Encounter (Signed)
Initiated prior authorization for Diclofenac Sodium 1 % with covermymeds.  Key [id number]. Awaiting response.

## 2019-01-28 NOTE — Telephone Encounter (Signed)
Received fax from [facility] stating Diclofenac was APPROVED 11/10/18-11/12/2019 Referral #:[id number] Member ID: [id number]

## 2019-03-03 ENCOUNTER — Other Ambulatory Visit: Payer: Medicare HMO

## 2019-03-06 ENCOUNTER — Ambulatory Visit: Payer: Medicare HMO | Admitting: Nurse Practitioner

## 2019-03-10 ENCOUNTER — Ambulatory Visit: Payer: Medicare HMO | Admitting: Nurse Practitioner

## 2019-04-02 ENCOUNTER — Other Ambulatory Visit: Payer: Self-pay

## 2019-04-09 ENCOUNTER — Ambulatory Visit (INDEPENDENT_AMBULATORY_CARE_PROVIDER_SITE_OTHER): Payer: Medicare HMO | Admitting: Nurse Practitioner

## 2019-04-09 ENCOUNTER — Encounter: Payer: Self-pay | Admitting: Nurse Practitioner

## 2019-04-09 ENCOUNTER — Other Ambulatory Visit: Payer: Self-pay

## 2019-04-09 DIAGNOSIS — E119 Type 2 diabetes mellitus without complications: Secondary | ICD-10-CM | POA: Diagnosis not present

## 2019-04-09 DIAGNOSIS — G250 Essential tremor: Secondary | ICD-10-CM | POA: Diagnosis not present

## 2019-04-09 DIAGNOSIS — M2550 Pain in unspecified joint: Secondary | ICD-10-CM | POA: Diagnosis not present

## 2019-04-09 DIAGNOSIS — I10 Essential (primary) hypertension: Secondary | ICD-10-CM | POA: Diagnosis not present

## 2019-04-09 DIAGNOSIS — I350 Nonrheumatic aortic (valve) stenosis: Secondary | ICD-10-CM | POA: Diagnosis not present

## 2019-04-09 DIAGNOSIS — N3281 Overactive bladder: Secondary | ICD-10-CM

## 2019-04-09 DIAGNOSIS — E782 Mixed hyperlipidemia: Secondary | ICD-10-CM

## 2019-04-09 NOTE — Progress Notes (Signed)
This service is provided via telemedicine  No vital signs collected/recorded due to the encounter was a telemedicine visit.   Location of patient (ex: home, work):  Home  Patient consents to a telephone visit:  Yes  Location of the provider (ex: office, home):  [facility], Office   Name of any referring provider:  N/A  Names of all persons participating in the telemedicine service and their role in the encounter: S.Chrae B/CMA, Sherrie Mustache, NP, and Patient   Time spent on call:  10 min with medical assistant      Careteam: Patient Care Team: Lauree Chandler, NP as PCP - General (Geriatric Medicine)  Advanced Directive information Does Patient Have a Medical Advance Directive?: Yes, Type of Advance Directive: Out of facility DNR (pink MOST or yellow form), Does patient want to make changes to medical advance directive?: No - Patient declined  No Known Allergies  Chief Complaint  Patient presents with  . Medical Management of Chronic Issues    4 month follow-up. Patient c/o cold feet episode, burning sensation in legs (now resolved). Ongoing pain in arms and shoulders. Telephone visit   . Quality Metric Gaps    Patient unable to schedule eye exam due to pandemic  . Medication Management    Discuss which tylenol would be best to take.      HPI: Patient is a 85 y.o. female for routine follow up.  Pt lives in facility and family not allowed in or out due to COVID-19 Lives alone and daughter brings her food.  Eating well.   OAB- taking myrbetriq-  opted not to go to urologist, no longer having the problem with feeling like she is not emptying. She does not drink a lot of fluids.   DM - diet controlled. A1c 6.3%.   Hyperlipidemia - stable on simvastatin. LDL 104; HDL 59, eats what she can   HTN -unable to check blood pressure, does not have machine. Has not had problem with this in the past. on HCTZ 12.5 mg by mouth daily   Hemoconcentration -  needs follow up CBC, previously stable. H/H 15.4/45.2.   She has chronic pain.She has b/l shoulder pain - last injection in feb 2020, not effective any more by Dr Ernestina Patches, offered another option but did not get into it. Unsure if it would help and unsure if insurance would cover. Willing to try if she is able. Pain gets bad. Takes oxyIR but only will use 1 a day, in the last few weeks has needed daily. Would not take that often before. Very constipating. Has been using tylenol 1000 mg by mouth daily. Pain mostly in shoulders and arms. Will go into her fingers.  Will use heat as well and muscle rubs but limited where she can use them.  Has back pain as well.   Severe AS - 2D echo EF 60-65% with critical AS. Reports no dizziness, chest pains but will get short of breath occasionally- worse when wearing her mask due to COVID. Continues on imdur.  Tremor - controlled, occasional has this but not all the time.   Constipation- controlled on mirlax daily    Review of Systems:  Review of Systems  Constitutional: Negative for chills, fever and malaise/fatigue.  HENT: Negative for congestion, ear pain, hearing loss, sinus pain, sore throat and tinnitus.   Eyes: Negative for discharge and redness.  Respiratory: Positive for shortness of breath (occasionally). Negative for cough.   Cardiovascular: Negative for chest  pain.  Gastrointestinal: Positive for constipation (controlled on miralax).  Genitourinary: Positive for frequency and urgency.  Musculoskeletal: Positive for joint pain.       Occasional joint pain, none currently  Neurological: Negative for dizziness and headaches.    Past Medical History:  Diagnosis Date  . Anemia   . Aortic stenosis   . Arthritis   . Breast cancer ([place])   . Carpal tunnel syndrome   . Chronic pain   . Colon cancer ([place])   . Fibromyalgia   . Hematuria   . History of hip replacement    bilateral  . Hyperglycemia   . Hyperlipidemia   . Hypertension   .  Lumbago   . Memory loss   . Osteoarthritis   . Osteopenia    Past Surgical History:  Procedure Laterality Date  . ABDOMINAL HYSTERECTOMY  1958  . BREAST LUMPECTOMY  2008  . CHOLECYSTECTOMY  2002  . colon cancer tumor  1993   Dr. Jens Som  . hip replacements     2013 and 2014   Social History:   reports that she has quit smoking. She has a 40.00 pack-year smoking history. She has never used smokeless tobacco. She reports current alcohol use. She reports that she does not use drugs.  Family History  Problem Relation Age of Onset  . Cancer Mother   . Heart disease Mother   . Cancer Sister   . Cancer Brother   . Cancer Sister   . Heart failure Brother   . Cancer Sister   . Heart failure Brother   . Hemachromatosis Son   . Diabetes Son     Medications: Patient's Medications  New Prescriptions   No medications on file  Previous Medications   ACETAMINOPHEN (TYLENOL) 325 MG CAPS    Take 1 tablet by mouth as needed.   DICLOFENAC SODIUM (VOLTAREN) 1 % GEL    APPLY 2 GRAMS TO THE AFFECTED AREA DAILY.   HYDROCHLOROTHIAZIDE (HYDRODIURIL) 25 MG TABLET    Take 0.5 tablets (12.5 mg total) by mouth daily.   ISOSORBIDE MONONITRATE (IMDUR) 30 MG 24 HR TABLET    Take 1 tablet (30 mg total) by mouth daily.   MIRABEGRON ER (MYRBETRIQ) 25 MG TB24 TABLET    Take 25 mg by mouth every other day.   MULTIPLE VITAMIN (MULTIVITAMIN WITH MINERALS) TABS TABLET    Take 1 tablet by mouth daily.   OXYCODONE (OXY IR/ROXICODONE) 5 MG IMMEDIATE RELEASE TABLET    Take 5 mg by mouth as needed for severe pain.   POLYETHYLENE GLYCOL (MIRALAX / GLYCOLAX) PACKET    Take 17 g by mouth daily.    SIMVASTATIN (ZOCOR) 40 MG TABLET    Take 1 tablet (40 mg total) by mouth daily.   UNABLE TO FIND    Med Name: CBD Liquid- 1 drop under tongue once daily, hold for 30 seconds  Modified Medications   No medications on file  Discontinued Medications   No medications on file     Physical Exam: unable due to tele-visit.     Labs reviewed: Basic Metabolic Panel: Recent Labs    04/23/18 1520 07/25/18 1025 10/27/18 10/27/18 1056  NA  --  145 144 144  K  --  4.1  --  3.9  CL  --  105  --  104  CO2  --  32  --  30  GLUCOSE  --  119*  --  117*  BUN  --  24 21  21  CREATININE  --  0.81 0.7 0.66  CALCIUM  --  9.6  --  10.0  TSH 2.58  --   --   --    Liver Function Tests: Recent Labs    07/25/18 1025 10/27/18 10/27/18 1056  AST  --   --  18  ALT 8  --  10  ALKPHOS  --  57  --   BILITOT  --   --  1.0  PROT  --   --  6.4   No results for input(s): LIPASE, AMYLASE in the last 8760 hours. No results for input(s): AMMONIA in the last 8760 hours. CBC: No results for input(s): WBC, NEUTROABS, HGB, HCT, MCV, PLT in the last 8760 hours. Lipid Panel: Recent Labs    07/25/18 1025 10/27/18 10/27/18 1056  CHOL 167  --  192  HDL 53  --  59  LDLCALC 88 104 104*  TRIG 156*  --  172*  CHOLHDL 3.2  --  3.3   TSH: Recent Labs    04/23/18 1520  TSH 2.58   A1C: Lab Results  Component Value Date   HGBA1C 6.3 (H) 10/27/2018     Assessment/Plan 1. Essential hypertension Not taking blood pressure at home but previously has been controlled, continues on Hctz  2. Multiple joint pain Worsening pain recently, encouraged to use tylenol 1000 mg by mouth every 8 hours as needed, notify if this does not help symptoms. Has oxycodone but using sparingly.  3. Tremor, essential Stable, has not worsened and does not effect ADLs  4. Aortic stenosis, severe Stable, without worsening of symptoms. Continues on imdur and hctz  5. Type 2 diabetes mellitus without complication, without long-term current use of insulin (HCC) Diet controlled. A1c 6.3 in December.   6. Mixed hyperlipidemia Continues on zocor daily.   7. Overactive bladder Continues on myrbetriq 25 mg by mouth daily   Next appt: 4 months with fasting labs prior to appt.  Carlos American. Harle Battiest  [facility]  [phone]    Virtual Visit via Telephone Note  I connected with pt on 04/09/19 at  3:15 PM EDT by telephone and verified that I am speaking with the correct person using two identifiers.  Location: Patient: home Provider: office   I discussed the limitations, risks, security and privacy concerns of performing an evaluation and management service by telephone and the availability of in person appointments. I also discussed with the patient that there may be a patient responsible charge related to this service. The patient expressed understanding and agreed to proceed.   I discussed the assessment and treatment plan with the patient. The patient was provided an opportunity to ask questions and all were answered. The patient agreed with the plan and demonstrated an understanding of the instructions.   The patient was advised to call back or seek an in-person evaluation if the symptoms worsen or if the condition fails to improve as anticipated.  I provided 15 minutes of non-face-to-face time during this encounter.  Carlos American. Harle Battiest Avs printed and mailed

## 2019-04-09 NOTE — Patient Instructions (Addendum)
Try extra strength tylenol 500 mg 2 tablet by mouth 3 times daily to help with pain.  Continue with heat and muscle rub after heat.   Notify if pain is not controlled with tylenol

## 2019-04-10 ENCOUNTER — Other Ambulatory Visit: Payer: Self-pay | Admitting: Nurse Practitioner

## 2019-04-10 DIAGNOSIS — E782 Mixed hyperlipidemia: Secondary | ICD-10-CM

## 2019-04-10 DIAGNOSIS — I350 Nonrheumatic aortic (valve) stenosis: Secondary | ICD-10-CM

## 2019-06-18 ENCOUNTER — Other Ambulatory Visit: Payer: Self-pay | Admitting: *Deleted

## 2019-06-18 MED ORDER — MIRABEGRON ER 25 MG PO TB24: 25.0000 mg | ORAL_TABLET | ORAL | 3 refills | Status: DC

## 2019-06-18 NOTE — Telephone Encounter (Signed)
[facility] 

## 2019-07-06 ENCOUNTER — Other Ambulatory Visit: Payer: Self-pay | Admitting: Nurse Practitioner

## 2019-07-06 DIAGNOSIS — E782 Mixed hyperlipidemia: Secondary | ICD-10-CM

## 2019-07-07 ENCOUNTER — Telehealth: Payer: Self-pay | Admitting: Orthopedic Surgery

## 2019-07-07 NOTE — Telephone Encounter (Signed)
Sent MyChart message through patient's chart for patient's daughter Anne Ng.

## 2019-07-07 NOTE — Telephone Encounter (Signed)
Talked with Anne Ng, patient's daughter and advised her of the procedure code 20610 for the gel injection for patient's shoulder. Talked with Abigail Butts May to clarify that 20610 was the correct procedure code.

## 2019-07-07 NOTE — Telephone Encounter (Signed)
April,  Patient's daughter called to state that insurance company needs a procedure code to cover gel injection.  \Please call daughter Anne Ng back to advise.  [phone]

## 2019-07-08 ENCOUNTER — Telehealth: Payer: Self-pay | Admitting: Orthopedic Surgery

## 2019-07-08 NOTE — Telephone Encounter (Signed)
Patient's daughter Anne Ng called asked for a call back concerning scheduling an appointment for her mother for the gel injection. The number to contact Anne Ng is [phone]

## 2019-07-09 ENCOUNTER — Telehealth: Payer: Self-pay

## 2019-07-09 NOTE — Telephone Encounter (Signed)
Talked with patient's daughter Anne Ng and scheduled an appointment with Dr. Marlou Sa for 07/23/2019.

## 2019-07-09 NOTE — Telephone Encounter (Signed)
Talked with patient's daughter Anne Ng concerning which gel injection she would prefer.  Stated that she would give [place] a call back, once she makes a decision.  Patient has an appointment with Dr. Marlou Sa on 07/23/2019

## 2019-07-15 ENCOUNTER — Encounter: Payer: Self-pay | Admitting: Orthopedic Surgery

## 2019-07-15 NOTE — Telephone Encounter (Signed)
There is not enough data out there to make the best decision.  A couple of patients have had good results with the gel injections.  Since it is a bit of uncharted territory in terms of long-term data I would go with the least expensive is to see if it helps.

## 2019-07-16 NOTE — Telephone Encounter (Signed)
Please have Josph Macho do it under fluoroscopy thanks

## 2019-07-17 NOTE — Telephone Encounter (Signed)
Nothing else needs to be done for the patient.  Thank you Blanche East please advise Dr. Ernestina Patches of directions for Visco-3.  Thank you. Inject 1 syringe into shoulder weekly for 3 weeks.

## 2019-07-22 ENCOUNTER — Telehealth: Payer: Self-pay | Admitting: *Deleted

## 2019-07-22 NOTE — Telephone Encounter (Signed)
Annette notified and agreed. Offered sooner appointment but refused stated they will keep scheduled appointment. Will call if worsens.

## 2019-07-22 NOTE — Telephone Encounter (Signed)
Patient may stop myrbetriq 25 mg Tablet.She will need a visit to evaluate her weakness and pain of arms.

## 2019-07-22 NOTE — Telephone Encounter (Signed)
Patient Caregiver called and stated that patient's Myrebetriq has went up. Stated that she was getting #30 for $47.00 and now she can only get #15 for $47.00. patient stated that it is not helping her much and she is going to stop it. Wants to know if there are any side effects from stopping it.   2. Patient is having pain and weakness in her arms and wonders if it is coming from the Simvastatin.   Please Advise.  Patient has an appointment 08/10/2019 (Forwarded to Blair Heys out of office)

## 2019-07-23 ENCOUNTER — Ambulatory Visit: Payer: Medicare HMO | Admitting: Orthopedic Surgery

## 2019-07-23 NOTE — Telephone Encounter (Signed)
Patient's daughter Anne Ng would like to know how much Dr. Ernestina Patches will be charging to do the gel injection into the shoulder?  I advised her on the cost of the Visco-3 injection, which she stated she was aware of, but she would like to know Dr. Romona Curls charge.  Could you please advise?  Cb# is [phone].  Thank you.

## 2019-07-24 ENCOUNTER — Telehealth: Payer: Self-pay

## 2019-07-24 NOTE — Telephone Encounter (Signed)
Patient's daughter stated that insurance will not cover injections, so it is self pay.  Patent's daughter was wanting to know about the charge from the provider.  How much would Dr. Ernestina Patches charge?

## 2019-07-24 NOTE — Telephone Encounter (Signed)
-----   Message from [facility] May, RT sent at 07/24/2019  2:14 PM EDT ----- Regarding: FW: injections I deleted the other message, sorry.   The best I can estimate is $14 per visit/injection OOP from patient.  Again, that is only an estimate.   Ins should pay 80% of the allowable.   ----- Message ----- From: May, Wendy, RT Sent: 07/24/2019   2:12 PM EDT To:  Subject: RE: injections                                   ----- Message ----- From: May, Wendy, RT Sent: 07/23/2019   9:53 AM EDT To: Abigail Butts May, RT Subject: injections                                     July 22, 2019 Ailene Rud, [place] to [place], Yonael Tulloch, [place]      3:11 PM Pt is scheduled for 9/22, 9/29, and 10/6 for Visco 3 injection. Pt daughter asked the cost of the injection due to insurance not covering injection.   Left shld.

## 2019-07-27 NOTE — Telephone Encounter (Signed)
We will bill out these codes- 20610 - reimburses at approx $63.88 - estimated OOP $12.78. 77002 - reimburses at approx $110.07 - estimated OOP $22.01. So $12.78 + $22.01 = $34.79 per visit OOP. This x 3 = $104.37 Plus the cost of meds - $110.  So overall I can estimate that the total OOP will be $214.37. I hope that makes sense and helps, call me if you need to discuss further. I have calculated based on insurance paying 80% of the allowable charges.

## 2019-07-27 NOTE — Telephone Encounter (Signed)
Talked with Anne Ng and advised her of Tressie Ellis message below.  Voiced that she understands.

## 2019-08-04 ENCOUNTER — Encounter: Payer: Self-pay | Admitting: Physical Medicine and Rehabilitation

## 2019-08-04 ENCOUNTER — Ambulatory Visit: Payer: Self-pay

## 2019-08-04 ENCOUNTER — Ambulatory Visit (INDEPENDENT_AMBULATORY_CARE_PROVIDER_SITE_OTHER): Payer: Medicare HMO | Admitting: Physical Medicine and Rehabilitation

## 2019-08-04 DIAGNOSIS — M19011 Primary osteoarthritis, right shoulder: Secondary | ICD-10-CM

## 2019-08-04 DIAGNOSIS — M25511 Pain in right shoulder: Secondary | ICD-10-CM

## 2019-08-04 DIAGNOSIS — G8929 Other chronic pain: Secondary | ICD-10-CM

## 2019-08-04 NOTE — Progress Notes (Signed)
Julie Walter - 85 y.o. female MRN [id number]  Date of birth: August 22, 1919  Office Visit Note: Visit Date: 08/04/2019 PCP: Lauree Chandler, NP Referred by: Lauree Chandler, NP  Subjective: Chief Complaint  Patient presents with   Right Shoulder - Pain   HPI:  Julie Walter is a 85 y.o. female who comes in today At the request of Dr. Anderson Malta for series of 3 Visco supplementation injections of the right glenohumeral joint.  Patient has end-stage glenohumeral joint arthritis.  She is not a surgical candidate at the age of 55.  Visco supplementation use today was Visco-3.  ROS Otherwise per HPI.  Assessment & Plan: Visit Diagnoses:  1. Chronic right shoulder pain   2. Glenohumeral arthritis, right     Plan: No additional findings.   Meds & Orders: No orders of the defined types were placed in this encounter.   Orders Placed This Encounter  Procedures   Large Joint Inj: R glenohumeral   XR C-ARM NO REPORT    Follow-up: No follow-ups on file.   Procedures: Large Joint Inj: R glenohumeral on 08/04/2019 3:16 PM Indications: pain and diagnostic evaluation Details: 22 G 3.5 in needle, fluoroscopy-guided anteromedial approach  Arthrogram: No  Medications: 20 mg Sodium Hyaluronate 20 MG/2ML (Visco-3) Outcome: tolerated well, no immediate complications  There was excellent flow of contrast producing a partial arthrogram of the glenohumeral joint. Procedure, treatment alternatives, risks and benefits explained, specific risks discussed. Consent was given by the patient. Immediately prior to procedure a time out was called to verify the correct patient, procedure, equipment, support staff and site/side marked as required. Patient was prepped and draped in the usual sterile fashion.      No notes on file   Clinical History: MRI LUMBAR SPINE WITHOUT CONTRAST  TECHNIQUE: Multiplanar, multisequence MR imaging of the lumbar spine was performed. No intravenous  contrast was administered.  COMPARISON:  None.  FINDINGS: Segmentation:  Normal  Alignment: Mild retrolisthesis L1-2 L2-3 L3-4 and L5-S1. Mild anterolisthesis L4-5  Vertebrae:  Negative for fracture or mass.  Conus medullaris and cauda equina: Conus extends to the L1 level. Conus and cauda equina appear normal.  Paraspinal and other soft tissues: Negative for mass or fluid collection  Disc levels:  T12-L1: Mild disc degeneration and mild facet degeneration without stenosis  L1-2: Advanced disc degeneration with disc space narrowing and endplate spurring. Mild spinal stenosis. Moderate subarticular stenosis bilaterally  L2-3: Moderate disc degeneration with diffuse endplate spurring and mild facet degeneration. Moderate subarticular stenosis on the right and mild subarticular stenosis on the left. Mild canal stenosis.  L3-4: Advanced disc degeneration with diffuse endplate spurring. Central disc protrusion. Bilateral facet hypertrophy with moderate to severe spinal stenosis. Subarticular and foraminal stenosis right greater than left.  L4-5: Severe spinal stenosis. Central disc protrusion with severe facet and ligamentum flavum hypertrophy. Subarticular stenosis bilaterally.  L5-S1: Moderate disc degeneration with diffuse endplate spurring. Mild subarticular and foraminal stenosis bilaterally. Mild facet degeneration bilaterally  IMPRESSION: Multilevel degenerative changes throughout the entire lumbar spine with spinal and foraminal stenosis at multiple levels as above  Moderate to severe spinal stenosis at L3-4 and severe spinal stenosis at L4-5.   Electronically Signed   By: Franchot Gallo M.D.   On: 10/29/2017 10:45     Objective:  VS:  HT:     WT:    BMI:      BP:    HR: bpm   TEMP: ( )  RESP:  Physical Exam  Ortho Exam Imaging: Xr C-arm No Report  Result Date: 08/04/2019 Please see Notes tab for imaging impression.

## 2019-08-04 NOTE — Progress Notes (Signed)
 .  Numeric Pain Rating Scale and Functional Assessment Average Pain 9   In the last MONTH (on 0-10 scale) has pain interfered with the following?  1. General activity like being  able to carry out your everyday physical activities such as walking, climbing stairs, carrying groceries, or moving a chair?  Rating(8)    -Dye Allergies.  

## 2019-08-05 ENCOUNTER — Other Ambulatory Visit: Payer: Self-pay

## 2019-08-05 ENCOUNTER — Other Ambulatory Visit: Payer: Medicare HMO

## 2019-08-05 DIAGNOSIS — E782 Mixed hyperlipidemia: Secondary | ICD-10-CM | POA: Diagnosis not present

## 2019-08-05 DIAGNOSIS — E119 Type 2 diabetes mellitus without complications: Secondary | ICD-10-CM | POA: Diagnosis not present

## 2019-08-05 DIAGNOSIS — I10 Essential (primary) hypertension: Secondary | ICD-10-CM

## 2019-08-05 MED ORDER — SODIUM HYALURONATE (VISCOSUP) 20 MG/2ML IX SOSY
20.0000 mg | PREFILLED_SYRINGE | INTRA_ARTICULAR | Status: AC | PRN
  Administered 2019-08-04: 20 mg via INTRA_ARTICULAR

## 2019-08-06 LAB — COMPLETE METABOLIC PANEL WITH GFR
AG Ratio: 1.7 (calc) (ref 1.0–2.5)
ALT: 9 U/L (ref 6–29)
AST: 18 U/L (ref 10–35)
Albumin: 4.1 g/dL (ref 3.6–5.1)
Alkaline phosphatase (APISO): 48 U/L (ref 37–153)
BUN: 15 mg/dL (ref 7–25)
CO2: 29 mmol/L (ref 20–32)
Calcium: 9.5 mg/dL (ref 8.6–10.4)
Chloride: 106 mmol/L (ref 98–110)
Creat: 0.67 mg/dL (ref 0.60–0.88)
GFR, Est African American: 85 mL/min/{1.73_m2} (ref >=60)
GFR, Est Non African American: 73 mL/min/{1.73_m2} (ref >=60)
Globulin: 2.4 g/dL (calc) (ref 1.9–3.7)
Glucose, Bld: 123 mg/dL — ABNORMAL HIGH (ref 65–99)
Potassium: 4.2 mmol/L (ref 3.5–5.3)
Sodium: 142 mmol/L (ref 135–146)
Total Bilirubin: 1 mg/dL (ref 0.2–1.2)
Total Protein: 6.5 g/dL (ref 6.1–8.1)

## 2019-08-06 LAB — CBC WITH DIFFERENTIAL/PLATELET
Absolute Monocytes: 561 cells/uL (ref 200–950)
Basophils Absolute: 20 cells/uL (ref 0–200)
Basophils Relative: 0.3 %
Eosinophils Absolute: 59 cells/uL (ref 15–500)
Eosinophils Relative: 0.9 %
HCT: 45.1 % — ABNORMAL HIGH (ref 35.0–45.0)
Hemoglobin: 15.5 g/dL (ref 11.7–15.5)
Lymphs Abs: 1340 cells/uL (ref 850–3900)
MCH: 32.2 pg (ref 27.0–33.0)
MCHC: 34.4 g/dL (ref 32.0–36.0)
MCV: 93.6 fL (ref 80.0–100.0)
MPV: 12 fL (ref 7.5–12.5)
Monocytes Relative: 8.5 %
Neutro Abs: 4620 cells/uL (ref 1500–7800)
Neutrophils Relative %: 70 %
Platelets: 141 10*3/uL (ref 140–400)
RBC: 4.82 10*6/uL (ref 3.80–5.10)
RDW: 12.2 % (ref 11.0–15.0)
Total Lymphocyte: 20.3 %
WBC: 6.6 10*3/uL (ref 3.8–10.8)

## 2019-08-06 LAB — LIPID PANEL
Cholesterol: 201 mg/dL — ABNORMAL HIGH (ref <200)
HDL: 54 mg/dL (ref >=50)
LDL Cholesterol (Calc): 115 mg/dL (calc) — ABNORMAL HIGH
Non-HDL Cholesterol (Calc): 147 mg/dL (calc) — ABNORMAL HIGH (ref <130)
Total CHOL/HDL Ratio: 3.7 (calc) (ref <5.0)
Triglycerides: 202 mg/dL — ABNORMAL HIGH (ref <150)

## 2019-08-06 LAB — HEMOGLOBIN A1C
Hgb A1c MFr Bld: 6.1 % of total Hgb — ABNORMAL HIGH (ref <5.7)
Mean Plasma Glucose: 128 (calc)
eAG (mmol/L): 7.1 (calc)

## 2019-08-10 ENCOUNTER — Ambulatory Visit (INDEPENDENT_AMBULATORY_CARE_PROVIDER_SITE_OTHER): Payer: Medicare HMO | Admitting: Nurse Practitioner

## 2019-08-10 ENCOUNTER — Encounter: Payer: Self-pay | Admitting: Nurse Practitioner

## 2019-08-10 ENCOUNTER — Other Ambulatory Visit: Payer: Self-pay

## 2019-08-10 VITALS — BP 138/80 | HR 68 | Temp 97.5°F | Ht <= 58 in | Wt 127.8 lb

## 2019-08-10 DIAGNOSIS — E782 Mixed hyperlipidemia: Secondary | ICD-10-CM

## 2019-08-10 DIAGNOSIS — G8929 Other chronic pain: Secondary | ICD-10-CM

## 2019-08-10 DIAGNOSIS — Z23 Encounter for immunization: Secondary | ICD-10-CM

## 2019-08-10 DIAGNOSIS — M25511 Pain in right shoulder: Secondary | ICD-10-CM | POA: Diagnosis not present

## 2019-08-10 DIAGNOSIS — N3281 Overactive bladder: Secondary | ICD-10-CM | POA: Diagnosis not present

## 2019-08-10 DIAGNOSIS — E119 Type 2 diabetes mellitus without complications: Secondary | ICD-10-CM

## 2019-08-10 DIAGNOSIS — M25512 Pain in left shoulder: Secondary | ICD-10-CM

## 2019-08-10 DIAGNOSIS — I350 Nonrheumatic aortic (valve) stenosis: Secondary | ICD-10-CM

## 2019-08-10 DIAGNOSIS — I10 Essential (primary) hypertension: Secondary | ICD-10-CM

## 2019-08-10 DIAGNOSIS — G250 Essential tremor: Secondary | ICD-10-CM

## 2019-08-10 MED ORDER — PRIMIDONE 50 MG PO TABS: 25.0000 mg | ORAL_TABLET | Freq: Every day | ORAL | 1 refills | Status: DC

## 2019-08-10 MED ORDER — HYDROCHLOROTHIAZIDE 25 MG PO TABS: 12.5000 mg | ORAL_TABLET | Freq: Every day | ORAL | 3 refills | Status: DC

## 2019-08-10 NOTE — Progress Notes (Signed)
Careteam: Patient Care Team: Julie Chandler, NP as PCP - General (Geriatric Medicine)  Advanced Directive information    No Known Allergies  Chief Complaint  Patient presents with  . Medical Management of Chronic Issues    4 month follow-up  . Tremors    Patient c/o tremors, affecting daily living activities   . Immunizations    Flu vaccine today, will have injection in arm tomorrow for cartilage replacement      HPI: Patient is a 85 y.o. female seen in the office today for routine follow up.  Hearing aides are not working well. Having more trouble hearing. Changed the wax guards and wants to make sure there isnt wax blocking ear canal.   Hyperlipidemia- continues on zocor. LDL not at goal. Reports she eats a lot of cheese.   Hypertension- continues on IMDUR, hctz. No significant chest pain.  Some shortness of breath with activity. Does not walk much.   Ongoing pain in shoulder- seeing orthopedics for this. Trying new gel injection. Due for 2nd injection tomorrow. First injection did not help- does not generally see benefit until 2nd.   Tremor- Having a harder time with tremor- can not get enough food to her mouth. Had tried medication in the past for tremor but unable to tolerate. Now unable to knit and do hand crafts but too shaky now.   Review of Systems:  Review of Systems  Constitutional: Negative for chills, fever and malaise/fatigue.  HENT: Negative for congestion, ear pain, hearing loss, sinus pain, sore throat and tinnitus.   Eyes: Negative for discharge and redness.  Respiratory: Positive for shortness of breath (occasionally). Negative for cough.   Cardiovascular: Negative for chest pain.  Gastrointestinal: Positive for constipation (controlled on miralax).  Genitourinary: Positive for frequency and urgency.  Musculoskeletal: Positive for joint pain.       Occasional joint pain, none currently  Neurological: Negative for dizziness and headaches.     Past Medical History:  Diagnosis Date  . Anemia   . Aortic stenosis   . Arthritis   . Breast cancer ([place])   . Carpal tunnel syndrome   . Chronic pain   . Colon cancer ([place])   . Fibromyalgia   . Hematuria   . History of hip replacement    bilateral  . Hyperglycemia   . Hyperlipidemia   . Hypertension   . Lumbago   . Memory loss   . Osteoarthritis   . Osteopenia    Past Surgical History:  Procedure Laterality Date  . ABDOMINAL HYSTERECTOMY  1958  . BREAST LUMPECTOMY  2008  . CHOLECYSTECTOMY  2002  . colon cancer tumor  1993   Dr. Jens Som  . hip replacements     2013 and 2014   Social History:   reports that she quit smoking about 32 years ago. She has a 40.00 pack-year smoking history. She has never used smokeless tobacco. She reports current alcohol use. She reports that she does not use drugs.  Family History  Problem Relation Age of Onset  . Cancer Mother   . Heart disease Mother   . Cancer Sister   . Cancer Brother   . Cancer Sister   . Heart failure Brother   . Cancer Sister   . Heart failure Brother   . Hemachromatosis Son   . Diabetes Son     Medications: Patient's Medications  New Prescriptions   No medications on file  Previous Medications   ACETAMINOPHEN (TYLENOL) 325  MG CAPS    Take 1 tablet by mouth as needed.   CETIRIZINE (ZYRTEC) 10 MG TABLET    Take 10 mg by mouth as needed for allergies.   DICLOFENAC SODIUM (VOLTAREN) 1 % GEL    APPLY 2 GRAMS TO THE AFFECTED AREA DAILY.   HYDROCHLOROTHIAZIDE (HYDRODIURIL) 25 MG TABLET    Take 0.5 tablets (12.5 mg total) by mouth daily.   ISOSORBIDE MONONITRATE (IMDUR) 30 MG 24 HR TABLET    TAKE 1 TABLET BY MOUTH EVERY DAY   MULTIPLE VITAMIN (MULTIVITAMIN WITH MINERALS) TABS TABLET    Take 1 tablet by mouth daily.   POLYETHYLENE GLYCOL (MIRALAX / GLYCOLAX) PACKET    Take 17 g by mouth daily.    SIMVASTATIN (ZOCOR) 40 MG TABLET    TAKE 1 TABLET BY MOUTH EVERY DAY   UNABLE TO FIND    Med Name: CBD Liquid- 1 drop  under tongue once daily, hold for 30 seconds  Modified Medications   No medications on file  Discontinued Medications   MIRABEGRON ER (MYRBETRIQ) 25 MG TB24 TABLET    Take 1 tablet (25 mg total) by mouth every other day.   OXYCODONE (OXY IR/ROXICODONE) 5 MG IMMEDIATE RELEASE TABLET    Take 5 mg by mouth as needed for severe pain.    Physical Exam:  Vitals:   08/10/19 1053  BP: 138/80  Pulse: 68  Temp: (!) 97.5 F (36.4 C)  TempSrc: Temporal  SpO2: 94%  Weight: 127 lb 12.8 oz (58 kg)  Height: 4\' 10"  (1.473 m)   Body mass index is 26.71 kg/m. Wt Readings from Last 3 Encounters:  08/10/19 127 lb 12.8 oz (58 kg)  12/30/18 128 lb 9.6 oz (58.3 kg)  10/29/18 130 lb 6.4 oz (59.1 kg)    Physical Exam Constitutional:      General: She is not in acute distress.    Appearance: She is well-developed. She is not diaphoretic.  HENT:     Head: Normocephalic and atraumatic.     Mouth/Throat:     Pharynx: No oropharyngeal exudate.  Eyes:     Conjunctiva/sclera: Conjunctivae normal.     Pupils: Pupils are equal, round, and reactive to light.  Neck:     Musculoskeletal: Normal range of motion and neck supple.  Cardiovascular:     Rate and Rhythm: Normal rate and regular rhythm.     Heart sounds: Murmur (3/6) present.  Pulmonary:     Effort: Pulmonary effort is normal.     Breath sounds: Normal breath sounds.  Abdominal:     General: Bowel sounds are normal.     Palpations: Abdomen is soft.  Musculoskeletal:        General: No tenderness or deformity.  Skin:    General: Skin is warm and dry.  Neurological:     Mental Status: She is alert and oriented to person, place, and time.     Labs reviewed: Basic Metabolic Panel: Recent Labs    10/27/18 10/27/18 1056 08/05/19 1057  NA 144 144 142  K  --  3.9 4.2  CL  --  104 106  CO2  --  30 29  GLUCOSE  --  117* 123*  BUN 21 21 15   CREATININE 0.7 0.66 0.67  CALCIUM  --  10.0 9.5   Liver Function Tests: Recent Labs     10/27/18 10/27/18 1056 08/05/19 1057  AST  --  18 18  ALT  --  10 9  ALKPHOS 57  --   --  BILITOT  --  1.0 1.0  PROT  --  6.4 6.5   No results for input(s): LIPASE, AMYLASE in the last 8760 hours. No results for input(s): AMMONIA in the last 8760 hours. CBC: Recent Labs    08/05/19 1057  WBC 6.6  NEUTROABS 4,620  HGB 15.5  HCT 45.1*  MCV 93.6  PLT 141   Lipid Panel: Recent Labs    10/27/18 10/27/18 1056 08/05/19 1057  CHOL  --  192 201*  HDL  --  59 54  LDLCALC 104 104* 115*  TRIG  --  172* 202*  CHOLHDL  --  3.3 3.7   TSH: No results for input(s): TSH in the last 8760 hours. A1C: Lab Results  Component Value Date   HGBA1C 6.1 (H) 08/05/2019     Assessment/Plan 1. Need for influenza vaccination - Flu Vaccine QUAD High Dose(Fluad)  2. Tremor, essential -progressive.  - Ambulatory referral to [facility] for OT - primidone (MYSOLINE) 50 MG tablet; Take 0.5 tablets (25 mg total) by mouth at bedtime.  Dispense: 30 tablet; Refill: 1  3. Essential hypertension -stable at this time. Continues on IMDUR and HCTZ - hydrochlorothiazide (HYDRODIURIL) 25 MG tablet; Take 0.5 tablets (12.5 mg total) by mouth daily.  Dispense: 45 tablet; Refill: 3  4. Aortic stenosis, severe Stable. No worsening shortness of breath, chest pains or edema.  5. Shoulder pain Ongoing, currently getting injections through orthopedics  6. Type 2 diabetes -diet controlled.   7. Mixed hyperlipidemia Continues on zocor, LDL not at goal but she is 85 years old and risk for weight loss with strict dietary restrictions. Will continue current regimen.   8. Overactive bladder Ongoing and without change. No added benefit from medication    Next appt: 4 months.  Carlos American. [place], [facility] [phone]

## 2019-08-10 NOTE — Patient Instructions (Signed)
Tremor, essential - Ambulatory referral to [facility] for therapy   to start primidone (MYSOLINE) 50 MG tablet; Take 0.5 tablets (25 mg total) by mouth at bedtime for tremor

## 2019-08-11 ENCOUNTER — Encounter: Payer: Self-pay | Admitting: Physical Medicine and Rehabilitation

## 2019-08-11 ENCOUNTER — Ambulatory Visit: Payer: Self-pay

## 2019-08-11 ENCOUNTER — Ambulatory Visit (INDEPENDENT_AMBULATORY_CARE_PROVIDER_SITE_OTHER): Payer: Medicare HMO | Admitting: Physical Medicine and Rehabilitation

## 2019-08-11 DIAGNOSIS — G8929 Other chronic pain: Secondary | ICD-10-CM

## 2019-08-11 DIAGNOSIS — M25511 Pain in right shoulder: Secondary | ICD-10-CM

## 2019-08-11 MED ORDER — SODIUM HYALURONATE (VISCOSUP) 20 MG/2ML IX SOSY
20.0000 mg | PREFILLED_SYRINGE | INTRA_ARTICULAR | Status: AC | PRN
  Administered 2019-08-11: 20 mg via INTRA_ARTICULAR

## 2019-08-11 NOTE — Progress Notes (Signed)
Here for second Visco 3 injection in right shoulder. No changes.

## 2019-08-11 NOTE — Progress Notes (Signed)
   Julie Walter - 85 y.o. female MRN [id number]  Date of birth: January 06, 1919  Office Visit Note: Visit Date: 08/11/2019 PCP: Lauree Chandler, NP Referred by: Lauree Chandler, NP  Subjective: Chief Complaint  Patient presents with  . Right Shoulder - Pain   HPI:  Julie Walter is a 85 y.o. female who comes in today For planned second Visco-3 injection for right shoulder OA.  ROS Otherwise per HPI.  Assessment & Plan: Visit Diagnoses:  1. Chronic right shoulder pain     Plan: No additional findings.   Meds & Orders: No orders of the defined types were placed in this encounter.   Orders Placed This Encounter  Procedures  . Large Joint Inj  . XR C-ARM NO REPORT    Follow-up: No follow-ups on file.   Procedures: Large Joint Inj: R glenohumeral on 08/11/2019 1:17 PM Indications: pain and diagnostic evaluation Details: 22 G 3.5 in needle, fluoroscopy-guided anteromedial approach  Arthrogram: No  Medications: 20 mg Sodium Hyaluronate 20 MG/2ML (Visco-3) Outcome: tolerated well, no immediate complications  There was excellent flow of contrast producing a partial arthrogram of the glenohumeral joint. Did see extravasation. Procedure, treatment alternatives, risks and benefits explained, specific risks discussed. Consent was given by the patient. Immediately prior to procedure a time out was called to verify the correct patient, procedure, equipment, support staff and site/side marked as required. Patient was prepped and draped in the usual sterile fashion.      No notes on file   Clinical History: MRI LUMBAR SPINE WITHOUT CONTRAST  TECHNIQUE: Multiplanar, multisequence MR imaging of the lumbar spine was performed. No intravenous contrast was administered.  COMPARISON:  None.  FINDINGS: Segmentation:  Normal  Alignment: Mild retrolisthesis L1-2 L2-3 L3-4 and L5-S1. Mild anterolisthesis L4-5  Vertebrae:  Negative for fracture or mass.  Conus  medullaris and cauda equina: Conus extends to the L1 level. Conus and cauda equina appear normal.  Paraspinal and other soft tissues: Negative for mass or fluid collection  Disc levels:  T12-L1: Mild disc degeneration and mild facet degeneration without stenosis  L1-2: Advanced disc degeneration with disc space narrowing and endplate spurring. Mild spinal stenosis. Moderate subarticular stenosis bilaterally  L2-3: Moderate disc degeneration with diffuse endplate spurring and mild facet degeneration. Moderate subarticular stenosis on the right and mild subarticular stenosis on the left. Mild canal stenosis.  L3-4: Advanced disc degeneration with diffuse endplate spurring. Central disc protrusion. Bilateral facet hypertrophy with moderate to severe spinal stenosis. Subarticular and foraminal stenosis right greater than left.  L4-5: Severe spinal stenosis. Central disc protrusion with severe facet and ligamentum flavum hypertrophy. Subarticular stenosis bilaterally.  L5-S1: Moderate disc degeneration with diffuse endplate spurring. Mild subarticular and foraminal stenosis bilaterally. Mild facet degeneration bilaterally  IMPRESSION: Multilevel degenerative changes throughout the entire lumbar spine with spinal and foraminal stenosis at multiple levels as above  Moderate to severe spinal stenosis at L3-4 and severe spinal stenosis at L4-5.   Electronically Signed   By: Franchot Gallo M.D.   On: 10/29/2017 10:45     Objective:  VS:  HT:    WT:   BMI:     BP:   HR: bpm  TEMP: ( )  RESP:  Physical Exam  Ortho Exam Imaging: No results found.

## 2019-08-12 DIAGNOSIS — G8929 Other chronic pain: Secondary | ICD-10-CM | POA: Diagnosis not present

## 2019-08-12 DIAGNOSIS — I10 Essential (primary) hypertension: Secondary | ICD-10-CM

## 2019-08-12 DIAGNOSIS — I350 Nonrheumatic aortic (valve) stenosis: Secondary | ICD-10-CM | POA: Diagnosis not present

## 2019-08-12 DIAGNOSIS — E782 Mixed hyperlipidemia: Secondary | ICD-10-CM | POA: Diagnosis not present

## 2019-08-12 DIAGNOSIS — G250 Essential tremor: Secondary | ICD-10-CM | POA: Diagnosis not present

## 2019-08-12 DIAGNOSIS — M25511 Pain in right shoulder: Secondary | ICD-10-CM | POA: Diagnosis not present

## 2019-08-12 DIAGNOSIS — M25512 Pain in left shoulder: Secondary | ICD-10-CM | POA: Diagnosis not present

## 2019-08-12 DIAGNOSIS — N3281 Overactive bladder: Secondary | ICD-10-CM | POA: Diagnosis not present

## 2019-08-12 DIAGNOSIS — M1990 Unspecified osteoarthritis, unspecified site: Secondary | ICD-10-CM | POA: Diagnosis not present

## 2019-08-12 DIAGNOSIS — E119 Type 2 diabetes mellitus without complications: Secondary | ICD-10-CM | POA: Diagnosis not present

## 2019-08-13 DIAGNOSIS — E782 Mixed hyperlipidemia: Secondary | ICD-10-CM | POA: Diagnosis not present

## 2019-08-13 DIAGNOSIS — E119 Type 2 diabetes mellitus without complications: Secondary | ICD-10-CM | POA: Diagnosis not present

## 2019-08-13 DIAGNOSIS — N3281 Overactive bladder: Secondary | ICD-10-CM | POA: Diagnosis not present

## 2019-08-13 DIAGNOSIS — I350 Nonrheumatic aortic (valve) stenosis: Secondary | ICD-10-CM | POA: Diagnosis not present

## 2019-08-13 DIAGNOSIS — M1990 Unspecified osteoarthritis, unspecified site: Secondary | ICD-10-CM | POA: Diagnosis not present

## 2019-08-13 DIAGNOSIS — G8929 Other chronic pain: Secondary | ICD-10-CM | POA: Diagnosis not present

## 2019-08-13 DIAGNOSIS — I10 Essential (primary) hypertension: Secondary | ICD-10-CM | POA: Diagnosis not present

## 2019-08-13 DIAGNOSIS — M25512 Pain in left shoulder: Secondary | ICD-10-CM | POA: Diagnosis not present

## 2019-08-13 DIAGNOSIS — G250 Essential tremor: Secondary | ICD-10-CM | POA: Diagnosis not present

## 2019-08-13 DIAGNOSIS — M25511 Pain in right shoulder: Secondary | ICD-10-CM | POA: Diagnosis not present

## 2019-08-18 ENCOUNTER — Encounter: Payer: Self-pay | Admitting: Physical Medicine and Rehabilitation

## 2019-08-18 ENCOUNTER — Ambulatory Visit (INDEPENDENT_AMBULATORY_CARE_PROVIDER_SITE_OTHER): Payer: Medicare HMO | Admitting: Physical Medicine and Rehabilitation

## 2019-08-18 ENCOUNTER — Ambulatory Visit: Payer: Self-pay

## 2019-08-18 ENCOUNTER — Other Ambulatory Visit: Payer: Self-pay

## 2019-08-18 DIAGNOSIS — G8929 Other chronic pain: Secondary | ICD-10-CM

## 2019-08-18 DIAGNOSIS — I10 Essential (primary) hypertension: Secondary | ICD-10-CM | POA: Diagnosis not present

## 2019-08-18 DIAGNOSIS — E782 Mixed hyperlipidemia: Secondary | ICD-10-CM | POA: Diagnosis not present

## 2019-08-18 DIAGNOSIS — E119 Type 2 diabetes mellitus without complications: Secondary | ICD-10-CM | POA: Diagnosis not present

## 2019-08-18 DIAGNOSIS — G250 Essential tremor: Secondary | ICD-10-CM | POA: Diagnosis not present

## 2019-08-18 DIAGNOSIS — I350 Nonrheumatic aortic (valve) stenosis: Secondary | ICD-10-CM | POA: Diagnosis not present

## 2019-08-18 DIAGNOSIS — M25512 Pain in left shoulder: Secondary | ICD-10-CM | POA: Diagnosis not present

## 2019-08-18 DIAGNOSIS — M25511 Pain in right shoulder: Secondary | ICD-10-CM

## 2019-08-18 DIAGNOSIS — N3281 Overactive bladder: Secondary | ICD-10-CM | POA: Diagnosis not present

## 2019-08-18 DIAGNOSIS — M1990 Unspecified osteoarthritis, unspecified site: Secondary | ICD-10-CM | POA: Diagnosis not present

## 2019-08-18 MED ORDER — SODIUM HYALURONATE (VISCOSUP) 20 MG/2ML IX SOSY
20.0000 mg | PREFILLED_SYRINGE | INTRA_ARTICULAR | Status: AC | PRN
  Administered 2019-08-18: 20 mg via INTRA_ARTICULAR

## 2019-08-18 NOTE — Progress Notes (Signed)
Here for third Visco 3 injection in right shoulder. No changes.

## 2019-08-18 NOTE — Progress Notes (Signed)
   Julie Walter - 85 y.o. female MRN [id number]  Date of birth: 29-Mar-1919  Office Visit Note: Visit Date: 08/18/2019 PCP: Lauree Chandler, NP Referred by: Lauree Chandler, NP  Subjective: Chief Complaint  Patient presents with  . Right Shoulder - Pain   HPI:  Julie Walter is a 85 y.o. female who comes in today For last in the series of 3 Visco-3 Hyalgan injections.  ROS Otherwise per HPI.  Assessment & Plan: Visit Diagnoses:  1. Chronic right shoulder pain     Plan: No additional findings.   Meds & Orders: No orders of the defined types were placed in this encounter.   Orders Placed This Encounter  Procedures  . Large Joint Inj  . XR C-ARM NO REPORT    Follow-up: No follow-ups on file.   Procedures: Large Joint Inj: R glenohumeral on 08/18/2019 1:03 PM Indications: pain and diagnostic evaluation Details: 22 G 3.5 in needle, fluoroscopy-guided anteromedial approach  Arthrogram: No  Medications: 20 mg Sodium Hyaluronate 20 MG/2ML (Visco-3) Outcome: tolerated well, no immediate complications  There was excellent flow of contrast producing a partial arthrogram of the glenohumeral joint. The patient did have relief of symptoms during the anesthetic phase of the injection. Procedure, treatment alternatives, risks and benefits explained, specific risks discussed. Consent was given by the patient. Immediately prior to procedure a time out was called to verify the correct patient, procedure, equipment, support staff and site/side marked as required. Patient was prepped and draped in the usual sterile fashion.      No notes on file   Clinical History: MRI LUMBAR SPINE WITHOUT CONTRAST  TECHNIQUE: Multiplanar, multisequence MR imaging of the lumbar spine was performed. No intravenous contrast was administered.  COMPARISON:  None.  FINDINGS: Segmentation:  Normal  Alignment: Mild retrolisthesis L1-2 L2-3 L3-4 and L5-S1. Mild anterolisthesis L4-5   Vertebrae:  Negative for fracture or mass.  Conus medullaris and cauda equina: Conus extends to the L1 level. Conus and cauda equina appear normal.  Paraspinal and other soft tissues: Negative for mass or fluid collection  Disc levels:  T12-L1: Mild disc degeneration and mild facet degeneration without stenosis  L1-2: Advanced disc degeneration with disc space narrowing and endplate spurring. Mild spinal stenosis. Moderate subarticular stenosis bilaterally  L2-3: Moderate disc degeneration with diffuse endplate spurring and mild facet degeneration. Moderate subarticular stenosis on the right and mild subarticular stenosis on the left. Mild canal stenosis.  L3-4: Advanced disc degeneration with diffuse endplate spurring. Central disc protrusion. Bilateral facet hypertrophy with moderate to severe spinal stenosis. Subarticular and foraminal stenosis right greater than left.  L4-5: Severe spinal stenosis. Central disc protrusion with severe facet and ligamentum flavum hypertrophy. Subarticular stenosis bilaterally.  L5-S1: Moderate disc degeneration with diffuse endplate spurring. Mild subarticular and foraminal stenosis bilaterally. Mild facet degeneration bilaterally  IMPRESSION: Multilevel degenerative changes throughout the entire lumbar spine with spinal and foraminal stenosis at multiple levels as above  Moderate to severe spinal stenosis at L3-4 and severe spinal stenosis at L4-5.   Electronically Signed   By: Franchot Gallo M.D.   On: 10/29/2017 10:45     Objective:  VS:  HT:    WT:   BMI:     BP:   HR: bpm  TEMP: ( )  RESP:  Physical Exam  Ortho Exam Imaging: No results found.

## 2019-08-20 DIAGNOSIS — M25512 Pain in left shoulder: Secondary | ICD-10-CM | POA: Diagnosis not present

## 2019-08-20 DIAGNOSIS — E119 Type 2 diabetes mellitus without complications: Secondary | ICD-10-CM | POA: Diagnosis not present

## 2019-08-20 DIAGNOSIS — G250 Essential tremor: Secondary | ICD-10-CM | POA: Diagnosis not present

## 2019-08-20 DIAGNOSIS — N3281 Overactive bladder: Secondary | ICD-10-CM | POA: Diagnosis not present

## 2019-08-20 DIAGNOSIS — I350 Nonrheumatic aortic (valve) stenosis: Secondary | ICD-10-CM | POA: Diagnosis not present

## 2019-08-20 DIAGNOSIS — G8929 Other chronic pain: Secondary | ICD-10-CM | POA: Diagnosis not present

## 2019-08-20 DIAGNOSIS — I10 Essential (primary) hypertension: Secondary | ICD-10-CM | POA: Diagnosis not present

## 2019-08-20 DIAGNOSIS — M1990 Unspecified osteoarthritis, unspecified site: Secondary | ICD-10-CM | POA: Diagnosis not present

## 2019-08-20 DIAGNOSIS — M25511 Pain in right shoulder: Secondary | ICD-10-CM | POA: Diagnosis not present

## 2019-08-20 DIAGNOSIS — E782 Mixed hyperlipidemia: Secondary | ICD-10-CM | POA: Diagnosis not present

## 2019-09-24 ENCOUNTER — Encounter: Payer: Self-pay | Admitting: Orthopedic Surgery

## 2019-09-24 ENCOUNTER — Encounter: Payer: Self-pay | Admitting: Nurse Practitioner

## 2019-09-27 NOTE — Telephone Encounter (Signed)
Narcotics in a 85 year old patient is fraught with difficulty and risk.  I think she may be able to try tramadol.  In general with this long-term pain management we do not really do too much of that.  We could do injections if they help but if they do not help no real need to do those either.

## 2019-09-29 NOTE — Telephone Encounter (Signed)
Okay for tramadol 1 p.o. daily to twice daily #60 with 1 refill please call thanks

## 2019-09-30 ENCOUNTER — Other Ambulatory Visit: Payer: Self-pay | Admitting: Surgical

## 2019-09-30 MED ORDER — TRAMADOL HCL 50 MG PO TABS: 50.0000 mg | ORAL_TABLET | Freq: Every day | ORAL | 1 refills | Status: DC | PRN

## 2019-10-29 ENCOUNTER — Other Ambulatory Visit: Payer: Self-pay | Admitting: Nurse Practitioner

## 2019-10-29 DIAGNOSIS — I350 Nonrheumatic aortic (valve) stenosis: Secondary | ICD-10-CM

## 2019-12-03 ENCOUNTER — Ambulatory Visit: Payer: Medicare HMO | Attending: Internal Medicine

## 2019-12-03 DIAGNOSIS — Z23 Encounter for immunization: Secondary | ICD-10-CM | POA: Insufficient documentation

## 2019-12-03 NOTE — Progress Notes (Signed)
   Covid-19 Vaccination Clinic  Name:  Bryer Hirose    MRN: [id number] DOB: 11-08-1919  12/03/2019  Ms. Regester was observed post Covid-19 immunization for 15 minutes without incidence. She was provided with Vaccine Information Sheet and instruction to access the V-Safe system.   Ms. Reiten was instructed to call 911 with any severe reactions post vaccine: Marland Kitchen Difficulty breathing  . Swelling of your face and throat  . A fast heartbeat  . A bad rash all over your body  . Dizziness and weakness    Immunizations Administered    Name Date Dose VIS Date Route   Pfizer COVID-19 Vaccine 12/03/2019 11:23 AM 0.3 mL 10/23/2019 Intramuscular   Manufacturer: [place]   Lot: [id number]   [place]: [id number]

## 2019-12-10 ENCOUNTER — Other Ambulatory Visit: Payer: Self-pay | Admitting: Nurse Practitioner

## 2019-12-10 DIAGNOSIS — E782 Mixed hyperlipidemia: Secondary | ICD-10-CM

## 2019-12-11 ENCOUNTER — Ambulatory Visit: Payer: Medicare HMO | Admitting: Nurse Practitioner

## 2019-12-14 ENCOUNTER — Other Ambulatory Visit: Payer: Self-pay

## 2019-12-14 ENCOUNTER — Encounter: Payer: Self-pay | Admitting: Nurse Practitioner

## 2019-12-14 ENCOUNTER — Ambulatory Visit (INDEPENDENT_AMBULATORY_CARE_PROVIDER_SITE_OTHER): Payer: Medicare HMO | Admitting: Nurse Practitioner

## 2019-12-14 VITALS — BP 114/70 | HR 72 | Temp 96.8°F | Ht <= 58 in | Wt 120.0 lb

## 2019-12-14 DIAGNOSIS — M791 Myalgia, unspecified site: Secondary | ICD-10-CM | POA: Diagnosis not present

## 2019-12-14 DIAGNOSIS — M5416 Radiculopathy, lumbar region: Secondary | ICD-10-CM | POA: Diagnosis not present

## 2019-12-14 DIAGNOSIS — E119 Type 2 diabetes mellitus without complications: Secondary | ICD-10-CM | POA: Diagnosis not present

## 2019-12-14 DIAGNOSIS — M2550 Pain in unspecified joint: Secondary | ICD-10-CM | POA: Diagnosis not present

## 2019-12-14 DIAGNOSIS — G250 Essential tremor: Secondary | ICD-10-CM | POA: Diagnosis not present

## 2019-12-14 DIAGNOSIS — E782 Mixed hyperlipidemia: Secondary | ICD-10-CM | POA: Diagnosis not present

## 2019-12-14 DIAGNOSIS — I350 Nonrheumatic aortic (valve) stenosis: Secondary | ICD-10-CM | POA: Diagnosis not present

## 2019-12-14 DIAGNOSIS — I10 Essential (primary) hypertension: Secondary | ICD-10-CM

## 2019-12-14 LAB — COMPLETE METABOLIC PANEL WITH GFR
AG Ratio: 1.9 (calc) (ref 1.0–2.5)
ALT: 8 U/L (ref 6–29)
AST: 16 U/L (ref 10–35)
Albumin: 4.2 g/dL (ref 3.6–5.1)
Alkaline phosphatase (APISO): 56 U/L (ref 37–153)
BUN: 18 mg/dL (ref 7–25)
CO2: 26 mmol/L (ref 20–32)
Calcium: 9.7 mg/dL (ref 8.6–10.4)
Chloride: 104 mmol/L (ref 98–110)
Creat: 0.73 mg/dL (ref 0.60–0.88)
GFR, Est African American: 79 mL/min/{1.73_m2} (ref >=60)
GFR, Est Non African American: 68 mL/min/{1.73_m2} (ref >=60)
Globulin: 2.2 g/dL (calc) (ref 1.9–3.7)
Glucose, Bld: 115 mg/dL — ABNORMAL HIGH (ref 65–99)
Potassium: 4.2 mmol/L (ref 3.5–5.3)
Sodium: 141 mmol/L (ref 135–146)
Total Bilirubin: 0.7 mg/dL (ref 0.2–1.2)
Total Protein: 6.4 g/dL (ref 6.1–8.1)

## 2019-12-14 LAB — CBC WITH DIFFERENTIAL/PLATELET
Absolute Monocytes: 768 cells/uL (ref 200–950)
Basophils Absolute: 38 cells/uL (ref 0–200)
Basophils Relative: 0.5 %
Eosinophils Absolute: 91 cells/uL (ref 15–500)
Eosinophils Relative: 1.2 %
HCT: 47.4 % — ABNORMAL HIGH (ref 35.0–45.0)
Hemoglobin: 16.3 g/dL — ABNORMAL HIGH (ref 11.7–15.5)
Lymphs Abs: 1588 cells/uL (ref 850–3900)
MCH: 32.7 pg (ref 27.0–33.0)
MCHC: 34.4 g/dL (ref 32.0–36.0)
MCV: 95 fL (ref 80.0–100.0)
MPV: 12 fL (ref 7.5–12.5)
Monocytes Relative: 10.1 %
Neutro Abs: 5115 cells/uL (ref 1500–7800)
Neutrophils Relative %: 67.3 %
Platelets: 159 10*3/uL (ref 140–400)
RBC: 4.99 10*6/uL (ref 3.80–5.10)
RDW: 11.7 % (ref 11.0–15.0)
Total Lymphocyte: 20.9 %
WBC: 7.6 10*3/uL (ref 3.8–10.8)

## 2019-12-14 NOTE — Progress Notes (Signed)
Careteam: Patient Care Team: Lauree Chandler, NP as PCP - General (Geriatric Medicine)  Advanced Directive information    No Known Allergies  Chief Complaint  Patient presents with  . Medical Management of Chronic Issues    4 month follow-up. Numbness in left leg (no known injury) and left ear lobe concerns.   . Ear Problem    Examine right ear  . Weight Loss    Weight down by 8 pounds.      HPI: Patient is a 86 y.o. female for routine follow up.  Living at [facility]. Daughter is able to meet her at the door for appts.  Being very cautious.  Had first COVID vaccine, second Feb 11th.   Weight down 8 lbs in the last year- does not get hungry. Arms hurt and she does not want to eat.  Does not eat lunch.  Sleeps until 10, eats a muffin with peanut butter.  Cheese with fruit for lunch Soup for dinner, casserole not much vegetables or bread   Will walk 15-20 mins three times a week for exercise Little bit of cleaning Decrease strength hard to make coffee due to reaching her arms up.   Chronic shoulder pain- saw orthopedic- injection did not work.  Using tramadol and tylenol with Voltaren and lidocaine. Feels like tramadol is not as effective.  Tylenol helps in the afternoon.   Numbness to left lower leg (around calf). No numbness or tingling to foot Ankle is stiff as well Once she moves around symptoms improve/resolve  Has to get up multiple times at night to urinate.   Ongoing hearing loss- plans to send in hearing aid for evaluation  Review of Systems:  Review of Systems  Constitutional: Positive for weight loss. Negative for chills and fever.  HENT: Positive for hearing loss. Negative for tinnitus.   Respiratory: Negative for cough, sputum production and shortness of breath.   Cardiovascular: Negative for chest pain, palpitations and leg swelling.  Gastrointestinal: Negative for abdominal pain, constipation, diarrhea and heartburn.    Genitourinary: Negative for dysuria, frequency and urgency.  Musculoskeletal: Positive for back pain, joint pain and myalgias. Negative for falls.  Skin: Negative.   Neurological: Negative for dizziness and headaches.  Psychiatric/Behavioral: Negative for depression and memory loss. The patient is not nervous/anxious and does not have insomnia.     Past Medical History:  Diagnosis Date  . Anemia   . Aortic stenosis   . Arthritis   . Breast cancer ([place])   . Carpal tunnel syndrome   . Chronic pain   . Colon cancer ([place])   . Fibromyalgia   . Hematuria   . History of hip replacement    bilateral  . Hyperglycemia   . Hyperlipidemia   . Hypertension   . Lumbago   . Memory loss   . Osteoarthritis   . Osteopenia    Past Surgical History:  Procedure Laterality Date  . ABDOMINAL HYSTERECTOMY  1958  . BREAST LUMPECTOMY  2008  . CHOLECYSTECTOMY  2002  . colon cancer tumor  1993   Dr. Jens Som  . hip replacements     2013 and 2014   Social History:   reports that she quit smoking about 33 years ago. She has a 40.00 pack-year smoking history. She has never used smokeless tobacco. She reports current alcohol use. She reports that she does not use drugs.  Family History  Problem Relation Age of Onset  . Cancer Mother   .  Heart disease Mother   . Cancer Sister   . Cancer Brother   . Cancer Sister   . Heart failure Brother   . Cancer Sister   . Heart failure Brother   . Hemachromatosis Son   . Diabetes Son     Medications: Patient's Medications  New Prescriptions   No medications on file  Previous Medications   ACETAMINOPHEN (TYLENOL) 500 MG TABLET    Take 500 mg by mouth as needed.   CETIRIZINE (ZYRTEC) 10 MG TABLET    Take 10 mg by mouth as needed for allergies.   DICLOFENAC SODIUM (VOLTAREN) 1 % GEL    APPLY 2 GRAMS TO THE AFFECTED AREA DAILY.   HYDROCHLOROTHIAZIDE (HYDRODIURIL) 25 MG TABLET    Take 0.5 tablets (12.5 mg total) by mouth daily.   ISOSORBIDE MONONITRATE  (IMDUR) 30 MG 24 HR TABLET    TAKE 1 TABLET BY MOUTH EVERY DAY   MULTIPLE VITAMIN (MULTIVITAMIN WITH MINERALS) TABS TABLET    Take 1 tablet by mouth daily.   POLYETHYLENE GLYCOL (MIRALAX / GLYCOLAX) PACKET    Take 17 g by mouth daily.    SIMVASTATIN (ZOCOR) 40 MG TABLET    TAKE 1 TABLET BY MOUTH EVERY DAY   TRAMADOL (ULTRAM) 50 MG TABLET    Take 1 tablet (50 mg total) by mouth daily as needed.  Modified Medications   No medications on file  Discontinued Medications   ACETAMINOPHEN (TYLENOL) 325 MG CAPS    Take 1 tablet by mouth as needed.   PRIMIDONE (MYSOLINE) 50 MG TABLET    Take 0.5 tablets (25 mg total) by mouth at bedtime.   UNABLE TO FIND    Med Name: CBD Liquid- 1 drop under tongue once daily, hold for 30 seconds    Physical Exam:  Vitals:   12/14/19 1304  BP: 114/70  Pulse: 72  Temp: (!) 96.8 F (36 C)  TempSrc: Temporal  SpO2: 96%  Weight: 120 lb (54.4 kg)  Height: 4\' 10"  (1.473 m)   Body mass index is 25.08 kg/m. Wt Readings from Last 3 Encounters:  12/14/19 120 lb (54.4 kg)  08/10/19 127 lb 12.8 oz (58 kg)  12/30/18 128 lb 9.6 oz (58.3 kg)    Physical Exam Constitutional:      General: She is not in acute distress.    Appearance: She is well-developed. She is not diaphoretic.  HENT:     Head: Normocephalic and atraumatic.     Mouth/Throat:     Pharynx: No oropharyngeal exudate.  Eyes:     Conjunctiva/sclera: Conjunctivae normal.     Pupils: Pupils are equal, round, and reactive to light.  Cardiovascular:     Rate and Rhythm: Normal rate and regular rhythm.     Heart sounds: Normal heart sounds.  Pulmonary:     Effort: Pulmonary effort is normal.     Breath sounds: Normal breath sounds.  Abdominal:     General: Bowel sounds are normal.     Palpations: Abdomen is soft.  Musculoskeletal:        General: No tenderness.     Right shoulder: Decreased range of motion.     Left shoulder: Decreased range of motion.     Cervical back: Normal range of motion  and neck supple.     Right lower leg: No edema.     Left lower leg: No edema.  Skin:    General: Skin is warm and dry.  Neurological:     Mental  Status: She is alert and oriented to person, place, and time.     Labs reviewed: Basic Metabolic Panel: Recent Labs    08/05/19 1057  NA 142  K 4.2  CL 106  CO2 29  GLUCOSE 123*  BUN 15  CREATININE 0.67  CALCIUM 9.5   Liver Function Tests: Recent Labs    08/05/19 1057  AST 18  ALT 9  BILITOT 1.0  PROT 6.5   No results for input(s): LIPASE, AMYLASE in the last 8760 hours. No results for input(s): AMMONIA in the last 8760 hours. CBC: Recent Labs    08/05/19 1057  WBC 6.6  NEUTROABS 4,620  HGB 15.5  HCT 45.1*  MCV 93.6  PLT 141   Lipid Panel: Recent Labs    08/05/19 1057  CHOL 201*  HDL 54  LDLCALC 115*  TRIG 202*  CHOLHDL 3.7   TSH: No results for input(s): TSH in the last 8760 hours. A1C: Lab Results  Component Value Date   HGBA1C 6.1 (H) 08/05/2019     Assessment/Plan 1. Essential hypertension -controlled on hctz and imdur, due to hx of severe aortic stenosis will continue regimen.  - COMPLETE METABOLIC PANEL WITH GFR - CBC with Differential/Platelet  2. Mixed hyperlipidemia -discuss risk vs benefit on simvastatin with her age. Due to increase in pain and myalgias will DC at this time. - COMPLETE METABOLIC PANEL WITH GFR  3. Aortic stenosis, severe Ongoing, symptoms remain stable without chest pains, shortness of breath or LE edema. Will continue HCTZ at this time.   4. Tremor, essential Stable. Medication in the past was not effective.   5. Type 2 diabetes mellitus without complication, without long-term current use of insulin ([place]) -diet controlled.   6. Lumbar radiculopathy Ongoing, using tramadol and tylenol for symptom management.   7. Myalgia -pt with increase pain in multiple joints and muscle. She is on simvastatin for cholesterol however at the age of 17 will stop at this time.  Will see if this improves symptoms.   8. Multiple joint pain -ongoing, joint injections were not effective, continues on tramadol daily, using tylenol 500 mg daily, educated that she can increase tylenol 500 mg 1-2 tablets every 8 hours as needed pain, max of 6 tablets in 24 hours.   Next appt: 4 months Maxxon Schwanke K. [place], [facility] [phone]

## 2019-12-14 NOTE — Patient Instructions (Addendum)
Can take tylenol 500 mg 1-2 tablets every 8 hours as needed pain Okay to take tylenol with tramadol in the morning, 1 tablet in the afternoon (with lunch) and 2 at bedtime.  - can take 6 tablets in 24 hours.  - STOP zocor  Schedule 4 month follow-up with labs same day

## 2019-12-24 ENCOUNTER — Ambulatory Visit: Payer: Medicare HMO | Attending: Internal Medicine

## 2019-12-24 DIAGNOSIS — Z23 Encounter for immunization: Secondary | ICD-10-CM | POA: Insufficient documentation

## 2019-12-24 NOTE — Progress Notes (Signed)
   Covid-19 Vaccination Clinic  Name:  Julie Walter    MRN: [id number] DOB: 1919-10-01  12/24/2019  [name] was observed post Covid-19 immunization for 15 minutes without incidence. She was provided with Vaccine Information Sheet and instruction to access the V-Safe system.   [name] was instructed to call 911 with any severe reactions post vaccine: Marland Kitchen Difficulty breathing  . Swelling of your face and throat  . A fast heartbeat  . A bad rash all over your body  . Dizziness and weakness    Immunizations Administered    Name Date Dose VIS Date Route   Pfizer COVID-19 Vaccine 12/24/2019 11:55 AM 0.3 mL 10/23/2019 Intramuscular   Manufacturer: [place]   Lot: [id number]   [place]: [id number]

## 2020-02-03 DIAGNOSIS — R32 Unspecified urinary incontinence: Secondary | ICD-10-CM | POA: Diagnosis not present

## 2020-02-03 DIAGNOSIS — Z803 Family history of malignant neoplasm of breast: Secondary | ICD-10-CM | POA: Diagnosis not present

## 2020-02-03 DIAGNOSIS — Z823 Family history of stroke: Secondary | ICD-10-CM | POA: Diagnosis not present

## 2020-02-03 DIAGNOSIS — G8929 Other chronic pain: Secondary | ICD-10-CM | POA: Diagnosis not present

## 2020-02-03 DIAGNOSIS — K5900 Constipation, unspecified: Secondary | ICD-10-CM | POA: Diagnosis not present

## 2020-02-03 DIAGNOSIS — Z809 Family history of malignant neoplasm, unspecified: Secondary | ICD-10-CM | POA: Diagnosis not present

## 2020-02-03 DIAGNOSIS — M1990 Unspecified osteoarthritis, unspecified site: Secondary | ICD-10-CM | POA: Diagnosis not present

## 2020-02-03 DIAGNOSIS — I739 Peripheral vascular disease, unspecified: Secondary | ICD-10-CM | POA: Diagnosis not present

## 2020-02-03 DIAGNOSIS — Z008 Encounter for other general examination: Secondary | ICD-10-CM | POA: Diagnosis not present

## 2020-02-03 DIAGNOSIS — I10 Essential (primary) hypertension: Secondary | ICD-10-CM | POA: Diagnosis not present

## 2020-02-03 DIAGNOSIS — Z79891 Long term (current) use of opiate analgesic: Secondary | ICD-10-CM | POA: Diagnosis not present

## 2020-02-08 ENCOUNTER — Other Ambulatory Visit: Payer: Self-pay | Admitting: Surgical

## 2020-02-09 NOTE — Telephone Encounter (Signed)
Ok to rf? 

## 2020-02-16 ENCOUNTER — Telehealth: Payer: Self-pay | Admitting: Orthopedic Surgery

## 2020-02-16 NOTE — Telephone Encounter (Signed)
Please advise 

## 2020-02-16 NOTE — Telephone Encounter (Signed)
Pts daughter Anne Ng  called in requesting a refill of tramadol be sent to the pts pharmacy. Anne Ng stated she would like a call back when this is done.   [phone]

## 2020-02-22 DIAGNOSIS — H903 Sensorineural hearing loss, bilateral: Secondary | ICD-10-CM | POA: Diagnosis not present

## 2020-03-10 ENCOUNTER — Other Ambulatory Visit: Payer: Medicare HMO

## 2020-03-10 ENCOUNTER — Ambulatory Visit: Payer: Medicare HMO | Admitting: Internal Medicine

## 2020-03-11 ENCOUNTER — Encounter: Payer: Self-pay | Admitting: Nurse Practitioner

## 2020-03-11 ENCOUNTER — Other Ambulatory Visit: Payer: Medicare HMO

## 2020-03-11 ENCOUNTER — Other Ambulatory Visit: Payer: Self-pay

## 2020-03-11 ENCOUNTER — Ambulatory Visit (INDEPENDENT_AMBULATORY_CARE_PROVIDER_SITE_OTHER): Payer: Medicare HMO | Admitting: Nurse Practitioner

## 2020-03-11 VITALS — BP 118/60 | HR 72 | Temp 97.3°F | Ht <= 58 in | Wt 118.2 lb

## 2020-03-11 DIAGNOSIS — R2681 Unsteadiness on feet: Secondary | ICD-10-CM | POA: Diagnosis not present

## 2020-03-11 DIAGNOSIS — M25512 Pain in left shoulder: Secondary | ICD-10-CM

## 2020-03-11 DIAGNOSIS — M791 Myalgia, unspecified site: Secondary | ICD-10-CM

## 2020-03-11 DIAGNOSIS — G8929 Other chronic pain: Secondary | ICD-10-CM | POA: Diagnosis not present

## 2020-03-11 DIAGNOSIS — I350 Nonrheumatic aortic (valve) stenosis: Secondary | ICD-10-CM

## 2020-03-11 DIAGNOSIS — M25511 Pain in right shoulder: Secondary | ICD-10-CM | POA: Diagnosis not present

## 2020-03-11 DIAGNOSIS — M5416 Radiculopathy, lumbar region: Secondary | ICD-10-CM

## 2020-03-11 DIAGNOSIS — E119 Type 2 diabetes mellitus without complications: Secondary | ICD-10-CM

## 2020-03-11 DIAGNOSIS — I10 Essential (primary) hypertension: Secondary | ICD-10-CM | POA: Diagnosis not present

## 2020-03-11 DIAGNOSIS — G250 Essential tremor: Secondary | ICD-10-CM | POA: Diagnosis not present

## 2020-03-11 DIAGNOSIS — E782 Mixed hyperlipidemia: Secondary | ICD-10-CM

## 2020-03-11 NOTE — Progress Notes (Signed)
Careteam: Patient Care Team: Lauree Chandler, NP as PCP - General (Geriatric Medicine)  PLACE OF SERVICE:  [place] Directive information Does Patient Have a Medical Advance Directive?: Yes, Type of Advance Directive: [place];Living will, Does patient want to make changes to medical advance directive?: No - Patient declined  No Known Allergies  Chief Complaint  Patient presents with  . Medical Management of Chronic Issues    4 month follow up      HPI: Patient is a 86 y.o. female who present to a follow up visit with her daughter, Anne Ng.   Myalgia/joint pain- pt has complaints of bilateral arm pain that radiates down her back/rib cage, especially on her right side. She had to used Voltaren gel on her back and heating pad for relief.  It hurts her to lift her arms to eat, "this is why I am loosing weight".   Fatigue- She states that she gets very tired without doing much. She will get up get ready and make her bed, then lay down. This continues all day. Pt is asking for a rolling walker with a seat.  Weight loss- Pt doesn't feel like eating at night because her arms hurt. She does drink 2-3 Ensures per week.  Skin- breaking out on her back, not sure if this is due to Voltarne gel.   Review of Systems:  Review of Systems  Constitutional: Positive for weight loss (2 lbs in 2 months). Negative for chills and fever.  HENT: Positive for hearing loss (wears hearing aids).   Respiratory: Negative for cough and shortness of breath.   Cardiovascular: Negative for chest pain and palpitations.  Gastrointestinal: Negative for nausea and vomiting.  Genitourinary: Negative.   Musculoskeletal: Positive for myalgias (arms and back).  Skin: Positive for rash (back).  Neurological: Positive for tremors.  Endo/Heme/Allergies: Does not bruise/bleed easily.    Past Medical History:  Diagnosis Date  . Anemia   . Aortic stenosis   . Arthritis   .  Breast cancer ([place])   . Carpal tunnel syndrome   . Chronic pain   . Colon cancer ([place])   . Fibromyalgia   . Hematuria   . History of hip replacement    bilateral  . Hyperglycemia   . Hyperlipidemia   . Hypertension   . Lumbago   . Memory loss   . Osteoarthritis   . Osteopenia    Past Surgical History:  Procedure Laterality Date  . ABDOMINAL HYSTERECTOMY  1958  . BREAST LUMPECTOMY  2008  . CHOLECYSTECTOMY  2002  . colon cancer tumor  1993   Dr. Jens Som  . hip replacements     2013 and 2014   Social History:   reports that she quit smoking about 33 years ago. She has a 40.00 pack-year smoking history. She has never used smokeless tobacco. She reports current alcohol use. She reports that she does not use drugs.  Family History  Problem Relation Age of Onset  . Cancer Mother   . Heart disease Mother   . Cancer Sister   . Cancer Brother   . Cancer Sister   . Heart failure Brother   . Cancer Sister   . Heart failure Brother   . Hemachromatosis Son   . Diabetes Son     Medications: Patient's Medications  New Prescriptions   No medications on file  Previous Medications   ACETAMINOPHEN (TYLENOL) 500 MG TABLET    Take 500 mg  by mouth as needed.   CETIRIZINE (ZYRTEC) 10 MG TABLET    Take 10 mg by mouth as needed for allergies.   DICLOFENAC SODIUM (VOLTAREN) 1 % GEL    APPLY 2 GRAMS TO THE AFFECTED AREA DAILY.   HYDROCHLOROTHIAZIDE (HYDRODIURIL) 25 MG TABLET    Take 0.5 tablets (12.5 mg total) by mouth daily.   ISOSORBIDE MONONITRATE (IMDUR) 30 MG 24 HR TABLET    TAKE 1 TABLET BY MOUTH EVERY DAY   MULTIPLE VITAMIN (MULTIVITAMIN WITH MINERALS) TABS TABLET    Take 1 tablet by mouth daily.   POLYETHYLENE GLYCOL (MIRALAX / GLYCOLAX) PACKET    Take 17 g by mouth daily.    TRAMADOL (ULTRAM) 50 MG TABLET    TAKE 1 TABLET (50 MG TOTAL) BY MOUTH DAILY AS NEEDED.  Modified Medications   No medications on file  Discontinued Medications   No medications on file    Physical  Exam:  Vitals:   03/11/20 1419  BP: 118/60  Pulse: 72  Temp: (!) 97.3 F (36.3 C)  TempSrc: Temporal  SpO2: 94%  Weight: 118 lb 3 oz (53.6 kg)  Height: 4\' 10"  (1.473 m)   Body mass index is 24.7 kg/m. Wt Readings from Last 3 Encounters:  03/11/20 118 lb 3 oz (53.6 kg)  12/14/19 120 lb (54.4 kg)  08/10/19 127 lb 12.8 oz (58 kg)    Physical Exam Vitals and nursing note reviewed.  Constitutional:      Appearance: Normal appearance. She is underweight.  Eyes:     Pupils: Pupils are equal, round, and reactive to light.  Cardiovascular:     Rate and Rhythm: Normal rate and regular rhythm.     Pulses:          Dorsalis pedis pulses are 1+ on the right side and 1+ on the left side.  Pulmonary:     Effort: Pulmonary effort is normal.     Breath sounds: Normal breath sounds.  Abdominal:     General: Bowel sounds are normal.     Palpations: Abdomen is soft.  Musculoskeletal:     Right shoulder: Bony tenderness present. Decreased range of motion.     Left shoulder: Bony tenderness present. Decreased range of motion.     Right forearm: Tenderness present.     Left forearm: Tenderness present.     Right wrist: Tenderness present.     Left wrist: Tenderness present.     Cervical back: Tenderness and bony tenderness present. Pain with movement present. Decreased range of motion.  Feet:     Right foot:     Protective Sensation: 3 sites tested. 3 sites sensed.     Skin integrity: Skin integrity normal.     Toenail Condition: Right toenails are normal.     Left foot:     Protective Sensation: 3 sites tested. 3 sites sensed.     Skin integrity: Skin integrity normal.     Toenail Condition: Left toenails are normal.  Skin:    General: Skin is warm.     Comments: Small areas of excoriation on her upper back where she is scratching  Neurological:     General: No focal deficit present.     Mental Status: She is alert. Mental status is at baseline.  Psychiatric:        Attention  and Perception: Attention normal.        Mood and Affect: Mood normal.        Speech: Speech normal.  Behavior: Behavior normal. Behavior is cooperative.        Thought Content: Thought content normal.        Cognition and Memory: Cognition and memory normal.        Judgment: Judgment normal.    Labs reviewed: Basic Metabolic Panel: Recent Labs    08/05/19 1057 12/14/19 1347  NA 142 141  K 4.2 4.2  CL 106 104  CO2 29 26  GLUCOSE 123* 115*  BUN 15 18  CREATININE 0.67 0.73  CALCIUM 9.5 9.7   Liver Function Tests: Recent Labs    08/05/19 1057 12/14/19 1347  AST 18 16  ALT 9 8  BILITOT 1.0 0.7  PROT 6.5 6.4   No results for input(s): LIPASE, AMYLASE in the last 8760 hours. No results for input(s): AMMONIA in the last 8760 hours. CBC: Recent Labs    08/05/19 1057 12/14/19 1347  WBC 6.6 7.6  NEUTROABS 4,620 5,115  HGB 15.5 16.3*  HCT 45.1* 47.4*  MCV 93.6 95.0  PLT 141 159   Lipid Panel: Recent Labs    08/05/19 1057  CHOL 201*  HDL 54  LDLCALC 115*  TRIG 202*  CHOLHDL 3.7   TSH: No results for input(s): TSH in the last 8760 hours. A1C: Lab Results  Component Value Date   HGBA1C 6.1 (H) 08/05/2019     Assessment/Plan 1. Essential hypertension -Stable; Controlled on HCTZ 25 mg daily and Imdur 1 tablet daily  2. Tremor, essential -Stable; not on medication for this  3. Myalgia -ongoing, educated on not using heat over gel and risk of burns and skin irritations.  -Pain in both arms, radiating down her back. -Continue Tramadol and tylenol for symptom management, plans to discuss with ortho  4. Chronic pain of both shoulders -ongoing, tramadol is effective but only using once daily, she is continuous pain, recommended to discuss with ortho for possible increase to every 6-8 hours. She does not have side effects at this time.  -Continue Tramadol and tylenol for symptom management  5. Type 2 diabetes mellitus without complication, without  long-term current use of insulin ([facility]) -Diet controlled  6. Mixed hyperlipidemia -statin stopped due to age, continue on lifestyle modifications.   7. Aortic stenosis, severe -Ongoing, no symptoms at this time. No chest pain, shortness of breath, or lower extremity edema.  8. Lumbar radiculopathy -Pt having fatigue easily with activity. Requesting a rolling walker with seat. - For home use only DME 4 wheeled rolling walker with seat [id number]  9. Gait instability -No falls but unstable on feet due to weakness and fatigue - For home use only DME 4 wheeled rolling walker with seat [id number]   Next appt: 4 months Iven Earnhart K. Eilyn [place], [facility] [phone]   I personally was present during the history, physical exam and medical decision-making activities of this service and have verified that the service and findings are accurately documented in the student's note

## 2020-03-11 NOTE — Progress Notes (Deleted)
Careteam: Patient Care Team: Lauree Chandler, NP as PCP - General (Geriatric Medicine)  PLACE OF SERVICE:  [place] Directive information    No Known Allergies  Chief Complaint  Patient presents with  . Medical Management of Chronic Issues    4 month follow up      HPI: Patient is a 86 y.o. female ***  Review of Systems:  ROS***  Past Medical History:  Diagnosis Date  . Anemia   . Aortic stenosis   . Arthritis   . Breast cancer ([place])   . Carpal tunnel syndrome   . Chronic pain   . Colon cancer ([place])   . Fibromyalgia   . Hematuria   . History of hip replacement    bilateral  . Hyperglycemia   . Hyperlipidemia   . Hypertension   . Lumbago   . Memory loss   . Osteoarthritis   . Osteopenia    Past Surgical History:  Procedure Laterality Date  . ABDOMINAL HYSTERECTOMY  1958  . BREAST LUMPECTOMY  2008  . CHOLECYSTECTOMY  2002  . colon cancer tumor  1993   Dr. Jens Som  . hip replacements     2013 and 2014   Social History:   reports that she quit smoking about 33 years ago. She has a 40.00 pack-year smoking history. She has never used smokeless tobacco. She reports current alcohol use. She reports that she does not use drugs.  Family History  Problem Relation Age of Onset  . Cancer Mother   . Heart disease Mother   . Cancer Sister   . Cancer Brother   . Cancer Sister   . Heart failure Brother   . Cancer Sister   . Heart failure Brother   . Hemachromatosis Son   . Diabetes Son     Medications: Patient's Medications  New Prescriptions   No medications on file  Previous Medications   ACETAMINOPHEN (TYLENOL) 500 MG TABLET    Take 500 mg by mouth as needed.   CETIRIZINE (ZYRTEC) 10 MG TABLET    Take 10 mg by mouth as needed for allergies.   DICLOFENAC SODIUM (VOLTAREN) 1 % GEL    APPLY 2 GRAMS TO THE AFFECTED AREA DAILY.   HYDROCHLOROTHIAZIDE (HYDRODIURIL) 25 MG TABLET    Take 0.5 tablets (12.5 mg total) by mouth daily.   ISOSORBIDE MONONITRATE (IMDUR) 30 MG 24 HR TABLET    TAKE 1 TABLET BY MOUTH EVERY DAY   MULTIPLE VITAMIN (MULTIVITAMIN WITH MINERALS) TABS TABLET    Take 1 tablet by mouth daily.   POLYETHYLENE GLYCOL (MIRALAX / GLYCOLAX) PACKET    Take 17 g by mouth daily.    TRAMADOL (ULTRAM) 50 MG TABLET    TAKE 1 TABLET (50 MG TOTAL) BY MOUTH DAILY AS NEEDED.  Modified Medications   No medications on file  Discontinued Medications   No medications on file    Physical Exam:  Vitals:   03/11/20 1419  Weight: 118 lb 3 oz (53.6 kg)  Height: 4\' 10"  (1.473 m)   Body mass index is 24.7 kg/m. Wt Readings from Last 3 Encounters:  03/11/20 118 lb 3 oz (53.6 kg)  12/14/19 120 lb (54.4 kg)  08/10/19 127 lb 12.8 oz (58 kg)    Physical Exam***  Labs reviewed: Basic Metabolic Panel: Recent Labs    08/05/19 1057 12/14/19 1347  NA 142 141  K 4.2 4.2  CL 106 104  CO2 29 26  GLUCOSE  123* 115*  BUN 15 18  CREATININE 0.67 0.73  CALCIUM 9.5 9.7   Liver Function Tests: Recent Labs    08/05/19 1057 12/14/19 1347  AST 18 16  ALT 9 8  BILITOT 1.0 0.7  PROT 6.5 6.4   No results for input(s): LIPASE, AMYLASE in the last 8760 hours. No results for input(s): AMMONIA in the last 8760 hours. CBC: Recent Labs    08/05/19 1057 12/14/19 1347  WBC 6.6 7.6  NEUTROABS 4,620 5,115  HGB 15.5 16.3*  HCT 45.1* 47.4*  MCV 93.6 95.0  PLT 141 159   Lipid Panel: Recent Labs    08/05/19 1057  CHOL 201*  HDL 54  LDLCALC 115*  TRIG 202*  CHOLHDL 3.7   TSH: No results for input(s): TSH in the last 8760 hours. A1C: Lab Results  Component Value Date   HGBA1C 6.1 (H) 08/05/2019     Assessment/Plan There are no diagnoses linked to this encounter.  Next appt: *** Nalda Shackleford K. [place], [facility] [phone]

## 2020-03-14 ENCOUNTER — Telehealth: Payer: Self-pay | Admitting: Surgical

## 2020-03-14 NOTE — Telephone Encounter (Signed)
Pls advise. Thanks.  

## 2020-03-14 NOTE — Telephone Encounter (Signed)
Patient's daughter Anne Ng called asked if the dosage of Tramadol can be increased . She said her mother is taking 1 tab a day and it's not helping with the pain. Anne Ng said her mother is using the Lidocaine patches and they help some. The number to contact Anne Ng is [phone]

## 2020-03-16 NOTE — Telephone Encounter (Signed)
IC discussed. Appointment scheduled

## 2020-03-18 ENCOUNTER — Ambulatory Visit: Payer: Self-pay

## 2020-03-18 ENCOUNTER — Other Ambulatory Visit: Payer: Self-pay

## 2020-03-18 ENCOUNTER — Ambulatory Visit: Payer: Medicare HMO | Admitting: Surgical

## 2020-03-18 DIAGNOSIS — M19011 Primary osteoarthritis, right shoulder: Secondary | ICD-10-CM

## 2020-03-18 DIAGNOSIS — M25511 Pain in right shoulder: Secondary | ICD-10-CM

## 2020-03-18 DIAGNOSIS — M19012 Primary osteoarthritis, left shoulder: Secondary | ICD-10-CM | POA: Diagnosis not present

## 2020-03-19 ENCOUNTER — Encounter: Payer: Self-pay | Admitting: Surgical

## 2020-03-19 DIAGNOSIS — M19012 Primary osteoarthritis, left shoulder: Secondary | ICD-10-CM | POA: Diagnosis not present

## 2020-03-19 DIAGNOSIS — M19011 Primary osteoarthritis, right shoulder: Secondary | ICD-10-CM

## 2020-03-19 MED ORDER — BUPIVACAINE HCL 0.25 % IJ SOLN
4.0000 mL | INTRAMUSCULAR | Status: AC | PRN
  Administered 2020-03-19: 4 mL via INTRA_ARTICULAR

## 2020-03-19 MED ORDER — METHYLPREDNISOLONE ACETATE 40 MG/ML IJ SUSP
40.0000 mg | INTRAMUSCULAR | Status: AC | PRN
  Administered 2020-03-19: 40 mg via INTRA_ARTICULAR

## 2020-03-19 MED ORDER — LIDOCAINE HCL 1 % IJ SOLN
5.0000 mL | INTRAMUSCULAR | Status: AC | PRN
  Administered 2020-03-19: 5 mL

## 2020-03-19 NOTE — Progress Notes (Signed)
Office Visit Note   Patient: Julie Walter           Date of Birth: 20-Jul-1919           MRN: [id number] Visit Date: 03/18/2020 Requested by: Lauree Chandler, NP [place],  [place] [id number] PCP: Lauree Chandler, NP  Subjective: Chief Complaint  Patient presents with  . Follow-up    HPI: Julie Walter is a 86 y.o. female who presents to the office complaining of right shoulder pain.  Patient has a long history of right shoulder pain.  She has never had surgery on her right shoulder.  She denies any recent falls.  She is able to lift her arm and denies any significant weakness in her arm.  She does have a decreased range of motion of the right shoulder joint.  She takes six 500 mg Tylenols per day along with using lidocaine patches, Voltaren gel and Aspercreme to treat her pain.  She was previously taking tramadol but did not have any significant relief from this medication.  She also complains of left shoulder pain as well.  Denies any numbness or tingling down her arms..                ROS:  All systems reviewed are negative as they relate to the chief complaint within the history of present illness.  Patient denies fevers or chills.  Assessment & Plan: Visit Diagnoses:  1. Acute pain of right shoulder   2. Primary osteoarthritis of shoulders, bilateral     Plan: Patient is a 86 year old female who presents complaining of bilateral shoulder pain.  Radiographs taken of the right shoulder today reveal severe end-stage arthritis of the right shoulder.  Previous left shoulder films were reviewed as well.  Discussed options available to patient.  She has significantly decreased range of motion of the bilateral shoulders but good strength with the rotator cuff.  Impression that her pain is from her arthritis.  Discussed tramadol; with her having no relief from it as well as the risks of taking narcotic medication and a 86 year old, we decided not to refill this  prescription.  She will continue with taking her 500 mg Tylenol, making sure not to exceed 3000 mg a day.  She also continue taking lidocaine patches, Voltaren gel, Aspercreme.  Offered bilateral shoulder injections today, which patient agreed to.  Bilateral glenohumeral cortisone injections were administered and patient tolerated the procedures well.  She will follow-up with the office as needed.  She may receive these injections every 3 to 4 months.  Follow-Up Instructions: No follow-ups on file.   Orders:  Orders Placed This Encounter  Procedures  . XR Shoulder Right   No orders of the defined types were placed in this encounter.     Procedures: Large Joint Inj: bilateral glenohumeral on 03/19/2020 1:30 PM Indications: diagnostic evaluation and pain Details: 18 G 1.5 in needle, posterior approach  Arthrogram: No  Medications (Right): 5 mL lidocaine 1 %; 4 mL bupivacaine 0.25 %; 40 mg methylPREDNISolone acetate 40 MG/ML Medications (Left): 5 mL lidocaine 1 %; 4 mL bupivacaine 0.25 %; 40 mg methylPREDNISolone acetate 40 MG/ML Outcome: tolerated well, no immediate complications Procedure, treatment alternatives, risks and benefits explained, specific risks discussed. Consent was given by the patient. Immediately prior to procedure a time out was called to verify the correct patient, procedure, equipment, support staff and site/side marked as required. Patient was prepped and draped in the usual sterile fashion.  Clinical Data: No additional findings.  Objective: Vital Signs: There were no vitals taken for this visit.  Physical Exam:  Constitutional: Patient appears well-developed HEENT:  Head: Normocephalic Eyes:EOM are normal Neck: Normal range of motion Cardiovascular: Normal rate Pulmonary/chest: Effort normal Neurologic: Patient is alert Skin: Skin is warm Psychiatric: Patient has normal mood and affect  Ortho Exam:  Bilateral shoulder Exam Able to fully  forward flex and abduct shoulder to 90 degrees Bilateral moderate loss of external rotation Mild TTP over the [facility] joint or bicipital groove  Good subscapularis, supraspinatus, and infraspinatus strength bilaterally 5/5 grip strength, forearm pronation/supination, and bicep strength  Specialty Comments:  No specialty comments available.  Imaging: No results found.   PMFS History: Patient Active Problem List   Diagnosis Date Noted  . Essential hypertension 01/10/2018  . Mixed hyperlipidemia 01/10/2018  . Multiple joint pain 01/10/2018  . Tremor, essential 01/10/2018  . Left sided sciatica 01/10/2018  . Lumbar radiculopathy 01/10/2018  . Aortic stenosis, severe 01/10/2018  . Type 2 diabetes mellitus without complication, without long-term current use of insulin ([place]) 01/10/2018   Past Medical History:  Diagnosis Date  . Anemia   . Aortic stenosis   . Arthritis   . Breast cancer ([place])   . Carpal tunnel syndrome   . Chronic pain   . Colon cancer ([place])   . Fibromyalgia   . Hematuria   . History of hip replacement    bilateral  . Hyperglycemia   . Hyperlipidemia   . Hypertension   . Lumbago   . Memory loss   . Osteoarthritis   . Osteopenia     Family History  Problem Relation Age of Onset  . Cancer Mother   . Heart disease Mother   . Cancer Sister   . Cancer Brother   . Cancer Sister   . Heart failure Brother   . Cancer Sister   . Heart failure Brother   . Hemachromatosis Son   . Diabetes Son     Past Surgical History:  Procedure Laterality Date  . ABDOMINAL HYSTERECTOMY  1958  . BREAST LUMPECTOMY  2008  . CHOLECYSTECTOMY  2002  . colon cancer tumor  1993   Dr. Jens Som  . hip replacements     2013 and 2014   Social History   Occupational History  . Not on file  Tobacco Use  . Smoking status: Former Smoker    Packs/day: 2.00    Years: 20.00    Pack years: 40.00    Quit date: 11/12/1986    Years since quitting: 33.3  . Smokeless tobacco: Never Used    . Tobacco comment: quit 25 years ago as of 2019   Substance and Sexual Activity  . Alcohol use: Yes    Comment: 1 glass of wine every couple weeks  . Drug use: No  . Sexual activity: Not Currently

## 2020-05-09 ENCOUNTER — Other Ambulatory Visit: Payer: Self-pay | Admitting: Nurse Practitioner

## 2020-05-09 DIAGNOSIS — I350 Nonrheumatic aortic (valve) stenosis: Secondary | ICD-10-CM

## 2020-05-24 ENCOUNTER — Telehealth: Payer: Self-pay | Admitting: Orthopedic Surgery

## 2020-05-24 NOTE — Telephone Encounter (Signed)
Patient's daughter called.   They were told to call if symptoms did not improve after the last injection. She wanted to let us know the shot worked for 5-6 weeks but she's experiencing a lot of pain again now. She made an appointment for more injections at the time of the call   Call back: [phone]

## 2020-05-25 NOTE — Telephone Encounter (Signed)
Pls advise. Thanks.  

## 2020-05-26 NOTE — Telephone Encounter (Signed)
Okay for repeat injections when she is 3 months out from last injections. Last injection on 03/18/20 so appointment near 06/18/20 would be ideal

## 2020-05-26 NOTE — Telephone Encounter (Signed)
Appt scheduled for 08/09

## 2020-06-20 ENCOUNTER — Ambulatory Visit: Payer: Medicare HMO | Admitting: Orthopedic Surgery

## 2020-06-20 ENCOUNTER — Other Ambulatory Visit: Payer: Self-pay

## 2020-06-20 DIAGNOSIS — M19012 Primary osteoarthritis, left shoulder: Secondary | ICD-10-CM

## 2020-06-20 DIAGNOSIS — M19011 Primary osteoarthritis, right shoulder: Secondary | ICD-10-CM

## 2020-06-22 ENCOUNTER — Encounter: Payer: Self-pay | Admitting: Orthopedic Surgery

## 2020-06-22 DIAGNOSIS — M19012 Primary osteoarthritis, left shoulder: Secondary | ICD-10-CM | POA: Diagnosis not present

## 2020-06-22 DIAGNOSIS — M19011 Primary osteoarthritis, right shoulder: Secondary | ICD-10-CM

## 2020-06-22 MED ORDER — BUPIVACAINE HCL 0.25 % IJ SOLN
4.0000 mL | INTRAMUSCULAR | Status: AC | PRN
  Administered 2020-06-22: 4 mL via INTRA_ARTICULAR

## 2020-06-22 MED ORDER — LIDOCAINE HCL 1 % IJ SOLN
5.0000 mL | INTRAMUSCULAR | Status: AC | PRN
  Administered 2020-06-22: 5 mL

## 2020-06-22 MED ORDER — METHYLPREDNISOLONE ACETATE 40 MG/ML IJ SUSP
40.0000 mg | INTRAMUSCULAR | Status: AC | PRN
  Administered 2020-06-22: 40 mg via INTRA_ARTICULAR

## 2020-06-22 NOTE — Progress Notes (Signed)
Office Visit Note   Patient: Julie Walter           Date of Birth: 13-May-1919           MRN: [id number] Visit Date: 06/20/2020 Requested by: Julie Chandler, NP [place],  [place] [id number] PCP: Julie Chandler, NP  Subjective: Chief Complaint  Patient presents with  . Left Shoulder - Pain  . Right Shoulder - Pain    HPI: Julie Walter is a 86 year old patient with bilateral shoulder arthritis.  Bilateral shoulder injections have helped her in the past.  She is getting somewhat depressed about the amount of pain that she is in from multiple areas on her body.  She is here with her daughter today.  She would like at least some type of temporary relief from her pain symptoms.              ROS: All systems reviewed are negative as they relate to the chief complaint within the history of present illness.  Patient denies  fevers or chills.   Assessment & Plan: Visit Diagnoses:  1. Primary osteoarthritis of shoulders, bilateral     Plan: Impression is bilateral shoulder arthritis.  These have been helped for about 6 weeks in the past from shoulder injections.  Bilateral glenohumeral joint injections performed today.  She does have moderate arthritis in both shoulders by radiographic examination.  Those old studies are reviewed.  Follow-up as needed for repeat shoulder injections.  Would not want to do more than 3/year.  Follow-Up Instructions: Return if symptoms worsen or fail to improve.   Orders:  No orders of the defined types were placed in this encounter.  No orders of the defined types were placed in this encounter.     Procedures: Large Joint Inj: bilateral glenohumeral on 06/22/2020 7:24 AM Indications: diagnostic evaluation and pain Details: 18 G 1.5 in needle, posterior approach  Arthrogram: No  Medications (Right): 5 mL lidocaine 1 %; 4 mL bupivacaine 0.25 %; 40 mg methylPREDNISolone acetate 40 MG/ML Medications (Left): 5 mL lidocaine 1 %; 4 mL  bupivacaine 0.25 %; 40 mg methylPREDNISolone acetate 40 MG/ML Outcome: tolerated well, no immediate complications Procedure, treatment alternatives, risks and benefits explained, specific risks discussed. Consent was given by the patient. Immediately prior to procedure a time out was called to verify the correct patient, procedure, equipment, support staff and site/side marked as required. Patient was prepped and draped in the usual sterile fashion.       Clinical Data: No additional findings.  Objective: Vital Signs: There were no vitals taken for this visit.  Physical Exam:   Constitutional: Patient appears well-developed HEENT:  Head: Normocephalic Eyes:EOM are normal Neck: Normal range of motion Cardiovascular: Normal rate Pulmonary/chest: Effort normal Neurologic: Patient is alert Skin: Skin is warm Psychiatric: Patient has normal mood and affect    Ortho Exam: Ortho exam demonstrates diminished active and passive range of motion of both shoulders but her deltoid is functional.  Motor or sensory function of the hand is intact.  Coarse grinding and crepitus is present with passive range of motion of both shoulders.  Specialty Comments:  No specialty comments available.  Imaging: No results found.   PMFS History: Patient Active Problem List   Diagnosis Date Noted  . Essential hypertension 01/10/2018  . Mixed hyperlipidemia 01/10/2018  . Multiple joint pain 01/10/2018  . Tremor, essential 01/10/2018  . Left sided sciatica 01/10/2018  . Lumbar radiculopathy 01/10/2018  . Aortic stenosis,  severe 01/10/2018  . Type 2 diabetes mellitus without complication, without long-term current use of insulin ([place]) 01/10/2018   Past Medical History:  Diagnosis Date  . Anemia   . Aortic stenosis   . Arthritis   . Breast cancer ([place])   . Carpal tunnel syndrome   . Chronic pain   . Colon cancer ([place])   . Fibromyalgia   . Hematuria   . History of hip replacement     bilateral  . Hyperglycemia   . Hyperlipidemia   . Hypertension   . Lumbago   . Memory loss   . Osteoarthritis   . Osteopenia     Family History  Problem Relation Age of Onset  . Cancer Mother   . Heart disease Mother   . Cancer Sister   . Cancer Brother   . Cancer Sister   . Heart failure Brother   . Cancer Sister   . Heart failure Brother   . Hemachromatosis Son   . Diabetes Son     Past Surgical History:  Procedure Laterality Date  . ABDOMINAL HYSTERECTOMY  1958  . BREAST LUMPECTOMY  2008  . CHOLECYSTECTOMY  2002  . colon cancer tumor  1993   Julie Walter  . hip replacements     2013 and 2014   Social History   Occupational History  . Not on file  Tobacco Use  . Smoking status: Former Smoker    Packs/day: 2.00    Years: 20.00    Pack years: 40.00    Quit date: 11/12/1986    Years since quitting: 33.6  . Smokeless tobacco: Never Used  . Tobacco comment: quit 25 years ago as of 2019   Vaping Use  . Vaping Use: Never used  Substance and Sexual Activity  . Alcohol use: Yes    Comment: 1 glass of wine every couple weeks  . Drug use: No  . Sexual activity: Not Currently

## 2020-06-23 ENCOUNTER — Encounter: Payer: Self-pay | Admitting: Nurse Practitioner

## 2020-06-23 ENCOUNTER — Ambulatory Visit (INDEPENDENT_AMBULATORY_CARE_PROVIDER_SITE_OTHER): Payer: Medicare HMO | Admitting: Nurse Practitioner

## 2020-06-23 ENCOUNTER — Telehealth: Payer: Self-pay

## 2020-06-23 ENCOUNTER — Other Ambulatory Visit: Payer: Self-pay

## 2020-06-23 DIAGNOSIS — Z0000 Encounter for general adult medical examination without abnormal findings: Secondary | ICD-10-CM

## 2020-06-23 NOTE — Telephone Encounter (Signed)
[name], sou are scheduled for a virtual visit with your provider today.    Just as we do with appointments in the office, we must obtain your consent to participate.  Your consent will be active for this visit and any virtual visit you may have with one of our providers in the next 365 days.    If you have a MyChart account, I can also send a copy of this consent to you electronically.  All virtual visits are billed to your insurance company just like a traditional visit in the office.  As this is a virtual visit, video technology does not allow for your provider to perform a traditional examination.  This may limit your provider's ability to fully assess your condition.  If your provider identifies any concerns that need to be evaluated in person or the need to arrange testing such as labs, EKG, etc, we will make arrangements to do so.    Although advances in technology are sophisticated, we cannot ensure that it will always work on either your end or our end.  If the connection with a video visit is poor, we may have to switch to a telephone visit.  With either a video or telephone visit, we are not always able to ensure that we have a secure connection.   I need to obtain your verbal consent now.   Are you willing to proceed with your visit today?   Julie Walter has provided verbal consent on 06/23/2020 for a virtual visit (video or telephone).   Carroll Kinds, CMA 06/23/2020  2:00 PM

## 2020-06-23 NOTE — Progress Notes (Signed)
This service is provided via telemedicine  No vital signs collected/recorded due to the encounter was a telemedicine visit.   Location of patient (ex: home, work):  Home  Patient consents to a telephone visit:  Yes, see encounter dated 06/23/2020  Location of the provider (ex: office, home):  [place]  Name of any referring provider:  N/A  Names of all persons participating in the telemedicine service and their role in the encounter:  Sherrie Mustache, Nurse Practitioner, Carroll Kinds, CMA, patient and daughter Anne Ng  Time spent on call:  10 minutes with medical assistant

## 2020-06-23 NOTE — Progress Notes (Signed)
Subjective:   Julie Walter is a 86 y.o. female who presents for Medicare Annual (Subsequent) preventive examination.  Review of Systems     Cardiac Risk Factors include: advanced age (>2men, >34 women);dyslipidemia;hypertension;sedentary lifestyle     Objective:    Today's Vitals   06/23/20 1538  PainSc: 3    There is no height or weight on file to calculate BMI.  Advanced Directives 06/23/2020 03/11/2020 04/09/2019 12/30/2018 10/29/2018 06/30/2018 12/25/2017  Does Patient Have a Medical Advance Directive? Yes Yes Yes Yes Yes Yes No  Type of Advance Directive Out of facility DNR (pink MOST or yellow form) [place];Living will Out of facility DNR (pink MOST or yellow form) Out of facility DNR (pink MOST or yellow form) Out of facility DNR (pink MOST or yellow form) Out of facility DNR (pink MOST or yellow form) -  Does patient want to make changes to medical advance directive? No - Patient declined No - Patient declined No - Patient declined No - Patient declined No - Patient declined - -  Copy of [place] in Chart? - Yes - validated most recent copy scanned in chart (See row information) - - - - -  Would patient like information on creating a medical advance directive? - - - - - - Yes (MAU/Ambulatory/Procedural Areas - Information given)  Pre-existing out of facility DNR order (yellow form or pink MOST form) - - - Yellow form placed in chart (order not valid for inpatient use) Yellow form placed in chart (order not valid for inpatient use) Yellow form placed in chart (order not valid for inpatient use) -    Current Medications (verified) Outpatient Encounter Medications as of 06/23/2020  Medication Sig  . acetaminophen (TYLENOL) 500 MG tablet Take 500 mg by mouth as needed.  . cetirizine (ZYRTEC) 10 MG tablet Take 10 mg by mouth as needed for allergies.  Marland Kitchen diclofenac sodium (VOLTAREN) 1 % GEL APPLY 2 GRAMS TO THE AFFECTED AREA DAILY.  .  hydrochlorothiazide (HYDRODIURIL) 25 MG tablet Take 0.5 tablets (12.5 mg total) by mouth daily.  . isosorbide mononitrate (IMDUR) 30 MG 24 hr tablet TAKE 1 TABLET BY MOUTH EVERY DAY  . Multiple Vitamin (MULTIVITAMIN WITH MINERALS) TABS tablet Take 1 tablet by mouth daily.  . polyethylene glycol (MIRALAX / GLYCOLAX) packet Take 17 g by mouth daily.   . [DISCONTINUED] traMADol (ULTRAM) 50 MG tablet TAKE 1 TABLET (50 MG TOTAL) BY MOUTH DAILY AS NEEDED.   No facility-administered encounter medications on file as of 06/23/2020.    Allergies (verified) Patient has no known allergies.   History: Past Medical History:  Diagnosis Date  . Anemia   . Aortic stenosis   . Arthritis   . Breast cancer ([place])   . Carpal tunnel syndrome   . Chronic pain   . Colon cancer ([place])   . Fibromyalgia   . Hematuria   . History of hip replacement    bilateral  . Hyperglycemia   . Hyperlipidemia   . Hypertension   . Lumbago   . Memory loss   . Osteoarthritis   . Osteopenia    Past Surgical History:  Procedure Laterality Date  . ABDOMINAL HYSTERECTOMY  1958  . BREAST LUMPECTOMY  2008  . CHOLECYSTECTOMY  2002  . colon cancer tumor  1993   Dr. Jens Som  . hip replacements     2013 and 2014   Family History  Problem Relation Age of Onset  . Cancer  Mother   . Heart disease Mother   . Cancer Sister   . Cancer Brother   . Cancer Sister   . Heart failure Brother   . Cancer Sister   . Heart failure Brother   . Hemachromatosis Son   . Diabetes Son    Social History   Socioeconomic History  . Marital status: Widowed    Spouse name: Not on file  . Number of children: Not on file  . Years of education: Not on file  . Highest education level: Not on file  Occupational History  . Not on file  Tobacco Use  . Smoking status: Former Smoker    Packs/day: 2.00    Years: 20.00    Pack years: 40.00    Quit date: 11/12/1986    Years since quitting: 33.6  . Smokeless tobacco: Never Used  . Tobacco  comment: quit 25 years ago as of 2019   Vaping Use  . Vaping Use: Never used  Substance and Sexual Activity  . Alcohol use: Yes    Comment: 1 glass of wine every couple weeks  . Drug use: No  . Sexual activity: Not Currently  Other Topics Concern  . Not on file  Social History Narrative   Social History      Diet? light      Do you drink/eat things with caffeine? Coffee 1/day      Marital status?             widowed                       What year were you married? 1944      Do you live in a house, apartment, assisted living, condo, trailer, etc.? Independent living      Is it one or more stories? 3 stories      How many persons live in your home? 1      Do you have any pets in your home? (please list)  none      Highest level of education completed? High school + secretarial studies post      Current or past profession: Herbalist, Arts administrator      Do you exercise?                 yes                     Type & how often? Walking, back exercises      Advanced Directives      Do you have a living will? yes      Do you have a DNR form?       no                           If not, do you want to discuss one? yes      Do you have signed POA/HPOA for forms? yes      Functional Status      Do you have difficulty bathing or dressing yourself? no      Do you have difficulty preparing food or eating? no      Do you have difficulty managing your medications? no      Do you have difficulty managing your finances? no      Do you have difficulty affording your medications? no   Social Determinants of Health   Financial Resource Strain:   .  Difficulty of Paying Living Expenses:   Food Insecurity:   . Worried About Charity fundraiser in the Last Year:   . Arboriculturist in the Last Year:   Transportation Needs:   . Film/video editor (Medical):   Marland Kitchen Lack of Transportation (Non-Medical):   Physical Activity:   . Days of Exercise per Week:   . Minutes  of Exercise per Session:   Stress:   . Feeling of Stress :   Social Connections:   . Frequency of Communication with Friends and Family:   . Frequency of Social Gatherings with Friends and Family:   . Attends Religious Services:   . Active Member of Clubs or Organizations:   . Attends Archivist Meetings:   Marland Kitchen Marital Status:     Tobacco Counseling Counseling given: Not Answered Comment: quit 25 years ago as of 2019    Clinical Intake:  Pre-visit preparation completed: Yes  Pain : 0-10 Pain Score: 3  Pain Type: Chronic pain Pain Location: Chest Pain Descriptors / Indicators: Aching Pain Onset: More than a month ago Pain Frequency: Rarely     BMI - recorded: 24 Nutritional Status: BMI of 19-24  Normal Nutritional Risks: None Diabetes: No  How often do you need to have someone help you when you read instructions, pamphlets, or other written materials from your doctor or pharmacy?: 1 - Never  Diabetic?no         Activities of Daily Living In your present state of health, do you have any difficulty performing the following activities: 06/23/2020  Hearing? Y  Vision? Y  Difficulty concentrating or making decisions? N  Walking or climbing stairs? Y  Dressing or bathing? Y  Comment limted ROM in arms.  Doing errands, shopping? Y  Comment daughter runs all Science writer and eating ? Y  Comment limited ROM in arms  Using the Toilet? N  In the past six months, have you accidently leaked urine? Y  Do you have problems with loss of bowel control? Y  Managing your Medications? N  Managing your Finances? N  Housekeeping or managing your Housekeeping? Y  Some recent data might be hidden    Patient Care Team: Lauree Chandler, NP as PCP - General (Geriatric Medicine)  Indicate any recent Medical Services you may have received from other than [facility] providers in the past year (date may be approximate).     Assessment:   This is a routine  wellness examination for [place].  Hearing/Vision screen  Hearing Screening   125Hz  250Hz  500Hz  1000Hz  2000Hz  3000Hz  4000Hz  6000Hz  8000Hz   Right ear:           Left ear:           Comments: Patient has hearing aids, but sometimes can't understand what's being said.  Vision Screening Comments: Patient does not having any problems with vision.Patient has not had an eye exam in past year.  Dietary issues and exercise activities discussed: Current Exercise Habits: The patient does not participate in regular exercise at present, Exercise limited by: orthopedic condition(s)  Goals   None    Depression Screen PHQ 2/9 Scores 06/23/2020 03/11/2020 04/09/2019 12/30/2018 12/25/2017  PHQ - 2 Score 0 0 0 0 4  PHQ- 9 Score - - - - 12    Fall Risk Fall Risk  06/23/2020 03/11/2020 12/14/2019 08/10/2019 04/09/2019  Falls in the past year? 1 0 0 1 1  Number falls in past yr: 0  0 0 0 0  Injury with Fall? 0 0 0 0 0  Comment - - - - -    Any stairs in or around the home? No  If so, are there any without handrails? No  Home free of loose throw rugs in walkways, pet beds, electrical cords, etc? Yes  Adequate lighting in your home to reduce risk of falls? Yes   ASSISTIVE DEVICES UTILIZED TO PREVENT FALLS:  Life alert? No  Use of a cane, walker or w/c? No  Grab bars in the bathroom? Yes  Shower chair or bench in shower? Yes  Elevated toilet seat or a handicapped toilet? Yes   TIMED UP AND GO: na  Cognitive Function: MMSE - Mini Mental State Exam 12/30/2018 12/25/2017  Orientation to time 5 5  Orientation to Place 5 4  Registration 3 3  Attention/ Calculation 5 5  Recall 3 2  Language- name 2 objects 2 2  Language- repeat 1 1  Language- follow 3 step command 2 3  Language- read & follow direction 1 1  Write a sentence 1 1  Copy design 1 1  Total score 29 28     6CIT Screen 06/23/2020  What Year? 0 points  What month? 0 points  What time? 0 points  Count back from 20 0 points  Months in  reverse 0 points  Repeat phrase 0 points  Total Score 0    Immunizations Immunization History  Administered Date(s) Administered  . Fluad Quad(high Dose 65+) 08/10/2019  . Influenza,inj,Quad PF,6+ Mos 08/30/2017, 07/30/2018  . Influenza-Unspecified 08/21/2016, 07/12/2017  . PFIZER SARS-COV-2 Vaccination 12/03/2019, 12/24/2019  . Pneumococcal Conjugate-13 04/06/2014  . Pneumococcal Polysaccharide-23 11/01/2016  . Tdap 06/30/2018  . Zoster Recombinat (Shingrix) 12/30/2018, 06/01/2019    TDAP status: Up to date Flu Vaccine status: Up to date Pneumococcal vaccine status: Up to date Covid-19 vaccine status: Completed vaccines  Qualifies for Shingles Vaccine? Yes   Zostavax completed No   Shingrix Completed?: Yes  Screening Tests Health Maintenance  Topic Date Due  . FOOT EXAM  01/11/2019  . URINE MICROALBUMIN  10/30/2019  . HEMOGLOBIN A1C  02/02/2020  . INFLUENZA VACCINE  06/12/2020  . OPHTHALMOLOGY EXAM  06/23/2020 (Originally 12/10/1928)  . DEXA SCAN  06/23/2021 (Originally 12/10/1985)  . TETANUS/TDAP  06/30/2028  . COVID-19 Vaccine  Completed  . PNA vac Low Risk Adult  Completed    Health Maintenance  Health Maintenance Due  Topic Date Due  . FOOT EXAM  01/11/2019  . URINE MICROALBUMIN  10/30/2019  . HEMOGLOBIN A1C  02/02/2020  . INFLUENZA VACCINE  06/12/2020    Colorectal cancer screening: No longer required.  Mammogram status: No longer required.  Bone Density status: Ordered pt declined. Pt provided with contact info and advised to call to schedule appt.  Lung Cancer Screening: (Low Dose CT Chest recommended if Age 50-80 years, 30 pack-year currently smoking OR have quit w/in 15years.) does not qualify.   Lung Cancer Screening Referral: na  Additional Screening:  Hepatitis C Screening: does not qualify; Completed na  Vision Screening: Recommended annual ophthalmology exams for early detection of glaucoma and other disorders of the eye. Is the patient up  to date with their annual eye exam?  no Who is the provider or what is the name of the office in which the patient attends annual eye exams? None, declines.  If pt is not established with a provider, would they like to be referred to a provider to establish  care? No .   Dental Screening: Recommended annual dental exams for proper oral hygiene  Community Resource Referral / Chronic Care Management: CRR required this visit?  No   CCM required this visit?  No      Plan:     I have personally reviewed and noted the following in the patient's chart:   . Medical and social history . Use of alcohol, tobacco or illicit drugs  . Current medications and supplements . Functional ability and status . Nutritional status . Physical activity . Advanced directives . List of other physicians . Hospitalizations, surgeries, and ER visits in previous 12 months . Vitals . Screenings to include cognitive, depression, and falls . Referrals and appointments  In addition, I have reviewed and discussed with patient certain preventive protocols, quality metrics, and best practice recommendations. A written personalized care plan for preventive services as well as general preventive health recommendations were provided to patient.     Lauree Chandler, NP   06/23/2020

## 2020-06-23 NOTE — Patient Instructions (Signed)
Julie Walter , Thank you for taking time to come for your Medicare Wellness Visit. I appreciate your ongoing commitment to your health goals. Please review the following plan we discussed and let me know if I can assist you in the future.   Screening recommendations/referrals: Colonoscopy aged out Mammogram aged out Bone Density declined.  Recommended yearly ophthalmology/optometry visit for glaucoma screening and checkup Recommended yearly dental visit for hygiene and checkup  Vaccinations: Influenza vaccine DUE- to get at office Pneumococcal vaccine up to date Tdap vaccine up to date Shingles vaccine up to date    Advanced directives: to bring updated living will and we will place most form on file at your next appt  Conditions/risks identified: advanced age, falls.  Next appointment: 07/06/2020 and 1 year for AWV    Preventive Care 84 Years and Older, Female Preventive care refers to lifestyle choices and visits with your health care provider that can promote health and wellness. What does preventive care include?  A yearly physical exam. This is also called an annual well check.  Dental exams once or twice a year.  Routine eye exams. Ask your health care provider how often you should have your eyes checked.  Personal lifestyle choices, including:  Daily care of your teeth and gums.  Regular physical activity.  Eating a healthy diet.  Avoiding tobacco and drug use.  Limiting alcohol use.  Practicing safe sex.  Taking low-dose aspirin every day.  Taking vitamin and mineral supplements as recommended by your health care provider. What happens during an annual well check? The services and screenings done by your health care provider during your annual well check will depend on your age, overall health, lifestyle risk factors, and family history of disease. Counseling  Your health care provider may ask you questions about your:  Alcohol use.  Tobacco use.  Drug  use.  Emotional well-being.  Home and relationship well-being.  Sexual activity.  Eating habits.  History of falls.  Memory and ability to understand (cognition).  Work and work Statistician.  Reproductive health. Screening  You may have the following tests or measurements:  Height, weight, and BMI.  Blood pressure.  Lipid and cholesterol levels. These may be checked every 5 years, or more frequently if you are over 84 years old.  Skin check.  Lung cancer screening. You may have this screening every year starting at age 84 if you have a 30-pack-year history of smoking and currently smoke or have quit within the past 15 years.  Fecal occult blood test (FOBT) of the stool. You may have this test every year starting at age 84.  Flexible sigmoidoscopy or colonoscopy. You may have a sigmoidoscopy every 5 years or a colonoscopy every 10 years starting at age 84.  Hepatitis C blood test.  Hepatitis B blood test.  Sexually transmitted disease (STD) testing.  Diabetes screening. This is done by checking your blood sugar (glucose) after you have not eaten for a while (fasting). You may have this done every 1-3 years.  Bone density scan. This is done to screen for osteoporosis. You may have this done starting at age 84.  Mammogram. This may be done every 1-2 years. Talk to your health care provider about how often you should have regular mammograms. Talk with your health care provider about your test results, treatment options, and if necessary, the need for more tests. Vaccines  Your health care provider may recommend certain vaccines, such as:  Influenza vaccine. This is recommended  every year.  Tetanus, diphtheria, and acellular pertussis (Tdap, Td) vaccine. You may need a Td booster every 10 years.  Zoster vaccine. You may need this after age 84.  Pneumococcal 13-valent conjugate (PCV13) vaccine. One dose is recommended after age 84.  Pneumococcal polysaccharide  (PPSV23) vaccine. One dose is recommended after age 84. Talk to your health care provider about which screenings and vaccines you need and how often you need them. This information is not intended to replace advice given to you by your health care provider. Make sure you discuss any questions you have with your health care provider. Document Released: 11/25/2015 Document Revised: 07/18/2016 Document Reviewed: 08/30/2015 Elsevier Interactive Patient Education  2017 [place] Prevention in the Home Falls can cause injuries. They can happen to people of all ages. There are many things you can do to make your home safe and to help prevent falls. What can I do on the outside of my home?  Regularly fix the edges of walkways and driveways and fix any cracks.  Remove anything that might make you trip as you walk through a door, such as a raised step or threshold.  Trim any bushes or trees on the path to your home.  Use bright outdoor lighting.  Clear any walking paths of anything that might make someone trip, such as rocks or tools.  Regularly check to see if handrails are loose or broken. Make sure that both sides of any steps have handrails.  Any raised decks and porches should have guardrails on the edges.  Have any leaves, snow, or ice cleared regularly.  Use sand or salt on walking paths during winter.  Clean up any spills in your garage right away. This includes oil or grease spills. What can I do in the bathroom?  Use night lights.  Install grab bars by the toilet and in the tub and shower. Do not use towel bars as grab bars.  Use non-skid mats or decals in the tub or shower.  If you need to sit down in the shower, use a plastic, non-slip stool.  Keep the floor dry. Clean up any water that spills on the floor as soon as it happens.  Remove soap buildup in the tub or shower regularly.  Attach bath mats securely with double-sided non-slip rug tape.  Do not have  throw rugs and other things on the floor that can make you trip. What can I do in the bedroom?  Use night lights.  Make sure that you have a light by your bed that is easy to reach.  Do not use any sheets or blankets that are too big for your bed. They should not hang down onto the floor.  Have a firm chair that has side arms. You can use this for support while you get dressed.  Do not have throw rugs and other things on the floor that can make you trip. What can I do in the kitchen?  Clean up any spills right away.  Avoid walking on wet floors.  Keep items that you use a lot in easy-to-reach places.  If you need to reach something above you, use a strong step stool that has a grab bar.  Keep electrical cords out of the way.  Do not use floor polish or wax that makes floors slippery. If you must use wax, use non-skid floor wax.  Do not have throw rugs and other things on the floor that can make you trip. What  can I do with my stairs?  Do not leave any items on the stairs.  Make sure that there are handrails on both sides of the stairs and use them. Fix handrails that are broken or loose. Make sure that handrails are as long as the stairways.  Check any carpeting to make sure that it is firmly attached to the stairs. Fix any carpet that is loose or worn.  Avoid having throw rugs at the top or bottom of the stairs. If you do have throw rugs, attach them to the floor with carpet tape.  Make sure that you have a light switch at the top of the stairs and the bottom of the stairs. If you do not have them, ask someone to add them for you. What else can I do to help prevent falls?  Wear shoes that:  Do not have high heels.  Have rubber bottoms.  Are comfortable and fit you well.  Are closed at the toe. Do not wear sandals.  If you use a stepladder:  Make sure that it is fully opened. Do not climb a closed stepladder.  Make sure that both sides of the stepladder are  locked into place.  Ask someone to hold it for you, if possible.  Clearly mark and make sure that you can see:  Any grab bars or handrails.  First and last steps.  Where the edge of each step is.  Use tools that help you move around (mobility aids) if they are needed. These include:  Canes.  Walkers.  Scooters.  Crutches.  Turn on the lights when you go into a dark area. Replace any light bulbs as soon as they burn out.  Set up your furniture so you have a clear path. Avoid moving your furniture around.  If any of your floors are uneven, fix them.  If there are any pets around you, be aware of where they are.  Review your medicines with your doctor. Some medicines can make you feel dizzy. This can increase your chance of falling. Ask your doctor what other things that you can do to help prevent falls. This information is not intended to replace advice given to you by your health care provider. Make sure you discuss any questions you have with your health care provider. Document Released: 08/25/2009 Document Revised: 04/05/2016 Document Reviewed: 12/03/2014 Elsevier Interactive Patient Education  2017 [employer].

## 2020-06-28 ENCOUNTER — Other Ambulatory Visit: Payer: Self-pay | Admitting: Nurse Practitioner

## 2020-06-28 DIAGNOSIS — E119 Type 2 diabetes mellitus without complications: Secondary | ICD-10-CM

## 2020-06-28 DIAGNOSIS — I10 Essential (primary) hypertension: Secondary | ICD-10-CM

## 2020-07-06 ENCOUNTER — Other Ambulatory Visit: Payer: Medicare HMO

## 2020-07-06 ENCOUNTER — Other Ambulatory Visit: Payer: Self-pay

## 2020-07-06 DIAGNOSIS — E119 Type 2 diabetes mellitus without complications: Secondary | ICD-10-CM

## 2020-07-06 DIAGNOSIS — I10 Essential (primary) hypertension: Secondary | ICD-10-CM | POA: Diagnosis not present

## 2020-07-07 LAB — CBC WITH DIFFERENTIAL/PLATELET
Absolute Monocytes: 608 cells/uL (ref 200–950)
Basophils Absolute: 40 cells/uL (ref 0–200)
Basophils Relative: 0.5 %
Eosinophils Absolute: 47 cells/uL (ref 15–500)
Eosinophils Relative: 0.6 %
HCT: 44.9 % (ref 35.0–45.0)
Hemoglobin: 15.5 g/dL (ref 11.7–15.5)
Lymphs Abs: 1509 cells/uL (ref 850–3900)
MCH: 34.5 pg — ABNORMAL HIGH (ref 27.0–33.0)
MCHC: 34.5 g/dL (ref 32.0–36.0)
MCV: 100 fL (ref 80.0–100.0)
MPV: 12.3 fL (ref 7.5–12.5)
Monocytes Relative: 7.7 %
Neutro Abs: 5696 cells/uL (ref 1500–7800)
Neutrophils Relative %: 72.1 %
Platelets: 157 10*3/uL (ref 140–400)
RBC: 4.49 10*6/uL (ref 3.80–5.10)
RDW: 12.3 % (ref 11.0–15.0)
Total Lymphocyte: 19.1 %
WBC: 7.9 10*3/uL (ref 3.8–10.8)

## 2020-07-07 LAB — COMPLETE METABOLIC PANEL WITH GFR
AG Ratio: 1.9 (calc) (ref 1.0–2.5)
ALT: 7 U/L (ref 6–29)
AST: 16 U/L (ref 10–35)
Albumin: 4.2 g/dL (ref 3.6–5.1)
Alkaline phosphatase (APISO): 44 U/L (ref 37–153)
BUN: 18 mg/dL (ref 7–25)
CO2: 27 mmol/L (ref 20–32)
Calcium: 9.6 mg/dL (ref 8.6–10.4)
Chloride: 102 mmol/L (ref 98–110)
Creat: 0.72 mg/dL (ref 0.60–0.88)
GFR, Est African American: 80 mL/min/{1.73_m2} (ref >=60)
GFR, Est Non African American: 69 mL/min/{1.73_m2} (ref >=60)
Globulin: 2.2 g/dL (calc) (ref 1.9–3.7)
Glucose, Bld: 112 mg/dL — ABNORMAL HIGH (ref 65–99)
Potassium: 3.9 mmol/L (ref 3.5–5.3)
Sodium: 141 mmol/L (ref 135–146)
Total Bilirubin: 0.9 mg/dL (ref 0.2–1.2)
Total Protein: 6.4 g/dL (ref 6.1–8.1)

## 2020-07-07 LAB — HEMOGLOBIN A1C
Hgb A1c MFr Bld: 6.2 % of total Hgb — ABNORMAL HIGH (ref <5.7)
Mean Plasma Glucose: 131 (calc)
eAG (mmol/L): 7.3 (calc)

## 2020-07-08 ENCOUNTER — Encounter: Payer: Self-pay | Admitting: Nurse Practitioner

## 2020-07-08 ENCOUNTER — Ambulatory Visit (INDEPENDENT_AMBULATORY_CARE_PROVIDER_SITE_OTHER): Payer: Medicare HMO | Admitting: Nurse Practitioner

## 2020-07-08 ENCOUNTER — Other Ambulatory Visit: Payer: Self-pay

## 2020-07-08 VITALS — BP 142/88 | HR 69 | Temp 96.9°F | Ht <= 58 in | Wt 116.8 lb

## 2020-07-08 DIAGNOSIS — M25511 Pain in right shoulder: Secondary | ICD-10-CM | POA: Diagnosis not present

## 2020-07-08 DIAGNOSIS — R2681 Unsteadiness on feet: Secondary | ICD-10-CM

## 2020-07-08 DIAGNOSIS — E119 Type 2 diabetes mellitus without complications: Secondary | ICD-10-CM

## 2020-07-08 DIAGNOSIS — G8929 Other chronic pain: Secondary | ICD-10-CM

## 2020-07-08 DIAGNOSIS — Z7189 Other specified counseling: Secondary | ICD-10-CM | POA: Diagnosis not present

## 2020-07-08 DIAGNOSIS — I350 Nonrheumatic aortic (valve) stenosis: Secondary | ICD-10-CM | POA: Diagnosis not present

## 2020-07-08 DIAGNOSIS — I10 Essential (primary) hypertension: Secondary | ICD-10-CM | POA: Diagnosis not present

## 2020-07-08 DIAGNOSIS — M5416 Radiculopathy, lumbar region: Secondary | ICD-10-CM

## 2020-07-08 DIAGNOSIS — M25512 Pain in left shoulder: Secondary | ICD-10-CM

## 2020-07-08 NOTE — Progress Notes (Signed)
Careteam: Patient Care Team: Lauree Chandler, NP as PCP - General (Geriatric Medicine)  PLACE OF SERVICE:  [place] Directive information Does Patient Have a Medical Advance Directive?: Yes, Type of Advance Directive: Out of facility DNR (pink MOST or yellow form), Does patient want to make changes to medical advance directive?: No - Patient declined  No Known Allergies  Chief Complaint  Patient presents with  . Medical Management of Chronic Issues    4 month follow-up   . Quality Metric Gaps    Discuss need for eye exam, foot exam, and MALB  . Immunizations    Flu vaccine not in stock, patient to schedule a nurse visit or get at local pharmacy      HPI: Patient is a 86 y.o. female for routine follow up  Continue with pain to back and shoulder. Some relief after the shots but pain still persist some days. Uses Voltaren gel and tylenol.   Constipation- Bowel movements are soft and loose but not diarrhea. Taking miralax every day.   Hypertension- controlled on isosorbide mononitrate and hctz.   Allergies- does not use zyrtec daily and now with increased dizziness, and feels like this is due to allergies.   Aortic stenosis- getting more out of breath more frequently. No swelling  Reports low back pain, under waist area, goes down to left, and leg "is not right" had injections in the past that benefited her with the pain. Relieve when she sits down which was the same when she had sciatica   Review of Systems:  Review of Systems  Constitutional: Negative for chills, fever and weight loss.  HENT: Negative for tinnitus.   Respiratory: Positive for shortness of breath. Negative for cough and sputum production.   Cardiovascular: Negative for chest pain, palpitations and leg swelling.  Gastrointestinal: Negative for abdominal pain, constipation, diarrhea and heartburn.  Genitourinary: Negative for dysuria, frequency and urgency.  Musculoskeletal: Positive for  myalgias. Negative for back pain, falls and joint pain.  Skin: Negative.   Neurological: Positive for weakness. Negative for dizziness and headaches.  Psychiatric/Behavioral: Negative for depression and memory loss. The patient does not have insomnia.     Past Medical History:  Diagnosis Date  . Anemia   . Aortic stenosis   . Arthritis   . Breast cancer ([place])   . Carpal tunnel syndrome   . Chronic pain   . Colon cancer ([place])   . Fibromyalgia   . Hematuria   . History of hip replacement    bilateral  . Hyperglycemia   . Hyperlipidemia   . Hypertension   . Lumbago   . Memory loss   . Osteoarthritis   . Osteopenia    Past Surgical History:  Procedure Laterality Date  . ABDOMINAL HYSTERECTOMY  1958  . BREAST LUMPECTOMY  2008  . CHOLECYSTECTOMY  2002  . colon cancer tumor  1993   Dr. Jens Som  . hip replacements     2013 and 2014   Social History:   reports that she quit smoking about 33 years ago. She has a 40.00 pack-year smoking history. She has never used smokeless tobacco. She reports current alcohol use. She reports that she does not use drugs.  Family History  Problem Relation Age of Onset  . Cancer Mother   . Heart disease Mother   . Cancer Sister   . Cancer Brother   . Cancer Sister   . Heart failure Brother   . Cancer  Sister   . Heart failure Brother   . Hemachromatosis Son   . Diabetes Son     Medications: Patient's Medications  New Prescriptions   No medications on file  Previous Medications   ACETAMINOPHEN (TYLENOL) 500 MG TABLET    Take 500 mg by mouth as needed.   CETIRIZINE (ZYRTEC) 10 MG TABLET    Take 10 mg by mouth as needed for allergies.   DICLOFENAC SODIUM (VOLTAREN) 1 % GEL    APPLY 2 GRAMS TO THE AFFECTED AREA DAILY.   HYDROCHLOROTHIAZIDE (HYDRODIURIL) 25 MG TABLET    Take 0.5 tablets (12.5 mg total) by mouth daily.   ISOSORBIDE MONONITRATE (IMDUR) 30 MG 24 HR TABLET    TAKE 1 TABLET BY MOUTH EVERY DAY   MULTIPLE VITAMIN (MULTIVITAMIN  WITH MINERALS) TABS TABLET    Take 1 tablet by mouth daily.   POLYETHYLENE GLYCOL (MIRALAX / GLYCOLAX) PACKET    Take 17 g by mouth daily.   Modified Medications   No medications on file  Discontinued Medications   No medications on file    Physical Exam:  Vitals:   07/08/20 1302  BP: (!) 142/88  Pulse: 69  Temp: (!) 96.9 F (36.1 C)  SpO2: 95%  Weight: 116 lb 12.8 oz (53 kg)  Height: 4\' 10"  (1.473 m)   Body mass index is 24.41 kg/m. Wt Readings from Last 3 Encounters:  07/08/20 116 lb 12.8 oz (53 kg)  03/11/20 118 lb 3 oz (53.6 kg)  12/14/19 120 lb (54.4 kg)    Physical Exam Constitutional:      General: She is not in acute distress.    Appearance: She is well-developed. She is not diaphoretic.  HENT:     Head: Normocephalic and atraumatic.     Mouth/Throat:     Pharynx: No oropharyngeal exudate.  Eyes:     Conjunctiva/sclera: Conjunctivae normal.     Pupils: Pupils are equal, round, and reactive to light.  Cardiovascular:     Rate and Rhythm: Normal rate and regular rhythm.     Heart sounds: Murmur heard.   Pulmonary:     Effort: Pulmonary effort is normal.     Breath sounds: Normal breath sounds.  Abdominal:     General: Bowel sounds are normal.     Palpations: Abdomen is soft.  Musculoskeletal:     Cervical back: Normal range of motion and neck supple.     Lumbar back: Tenderness present.       Back:  Skin:    General: Skin is warm and dry.  Neurological:     Mental Status: She is alert and oriented to person, place, and time.    Labs reviewed: Basic Metabolic Panel: Recent Labs    08/05/19 1057 12/14/19 1347 07/06/20 1219  NA 142 141 141  K 4.2 4.2 3.9  CL 106 104 102  CO2 29 26 27   GLUCOSE 123* 115* 112*  BUN 15 18 18   CREATININE 0.67 0.73 0.72  CALCIUM 9.5 9.7 9.6   Liver Function Tests: Recent Labs    08/05/19 1057 12/14/19 1347 07/06/20 1219  AST 18 16 16   ALT 9 8 7   BILITOT 1.0 0.7 0.9  PROT 6.5 6.4 6.4   No results for  input(s): LIPASE, AMYLASE in the last 8760 hours. No results for input(s): AMMONIA in the last 8760 hours. CBC: Recent Labs    08/05/19 1057 12/14/19 1347 07/06/20 1219  WBC 6.6 7.6 7.9  NEUTROABS 4,620 5,115 5,696  HGB 15.5 16.3* 15.5  HCT 45.1* 47.4* 44.9  MCV 93.6 95.0 100.0  PLT 141 159 157   Lipid Panel: Recent Labs    08/05/19 1057  CHOL 201*  HDL 54  LDLCALC 115*  TRIG 202*  CHOLHDL 3.7   TSH: No results for input(s): TSH in the last 8760 hours. A1C: Lab Results  Component Value Date   HGBA1C 6.2 (H) 07/06/2020     Assessment/Plan 1. Lumbar radiculopathy Ongoing, has had injections in the past which improved pain, feels like this may benefit quality of life.  - Ambulatory referral to Neurosurgery  2. Type 2 diabetes mellitus without complication, without long-term current use of insulin (HCC) -diet controlled, does not wish to have urine micro done today.  - Ambulatory referral to Ophthalmology  3. Advance care planning Completed most form, she prefers comfort measures only, does not wish to prolong life.   4. Essential hypertension Stable on imdur and HCTZ  5. Aortic stenosis, severe With some periods of worsening shortness of breath and chest pains but overall stable. To notify office is symptoms become worse or more constant. Without any LE edema and lungs clear at this time.   6. Chronic pain of both shoulders Continues to follow up with orthopedics for injections that have been beneficial.   7. Gait instability Encouraged use of walker, fall prevention.   Next appt: 6 months. Carlos American. [place], [facility] [phone]

## 2020-07-11 ENCOUNTER — Ambulatory Visit: Payer: Medicare HMO | Admitting: Nurse Practitioner

## 2020-07-11 ENCOUNTER — Other Ambulatory Visit: Payer: Medicare HMO

## 2020-07-20 ENCOUNTER — Encounter: Payer: Self-pay | Admitting: Nurse Practitioner

## 2020-07-22 DIAGNOSIS — M5412 Radiculopathy, cervical region: Secondary | ICD-10-CM | POA: Diagnosis not present

## 2020-07-22 DIAGNOSIS — I10 Essential (primary) hypertension: Secondary | ICD-10-CM | POA: Diagnosis not present

## 2020-07-22 DIAGNOSIS — M48061 Spinal stenosis, lumbar region without neurogenic claudication: Secondary | ICD-10-CM | POA: Diagnosis not present

## 2020-08-03 DIAGNOSIS — M542 Cervicalgia: Secondary | ICD-10-CM | POA: Diagnosis not present

## 2020-08-03 DIAGNOSIS — M5412 Radiculopathy, cervical region: Secondary | ICD-10-CM | POA: Diagnosis not present

## 2020-08-05 ENCOUNTER — Other Ambulatory Visit: Payer: Self-pay | Admitting: Nurse Practitioner

## 2020-08-05 DIAGNOSIS — I10 Essential (primary) hypertension: Secondary | ICD-10-CM

## 2020-08-09 DIAGNOSIS — R2689 Other abnormalities of gait and mobility: Secondary | ICD-10-CM | POA: Diagnosis not present

## 2020-08-09 DIAGNOSIS — M6281 Muscle weakness (generalized): Secondary | ICD-10-CM | POA: Diagnosis not present

## 2020-08-09 DIAGNOSIS — M5412 Radiculopathy, cervical region: Secondary | ICD-10-CM | POA: Diagnosis not present

## 2020-08-18 DIAGNOSIS — R2689 Other abnormalities of gait and mobility: Secondary | ICD-10-CM | POA: Diagnosis not present

## 2020-08-18 DIAGNOSIS — M6281 Muscle weakness (generalized): Secondary | ICD-10-CM | POA: Diagnosis not present

## 2020-08-18 DIAGNOSIS — M5412 Radiculopathy, cervical region: Secondary | ICD-10-CM | POA: Diagnosis not present

## 2020-08-25 DIAGNOSIS — M6281 Muscle weakness (generalized): Secondary | ICD-10-CM | POA: Diagnosis not present

## 2020-08-25 DIAGNOSIS — M5412 Radiculopathy, cervical region: Secondary | ICD-10-CM | POA: Diagnosis not present

## 2020-08-25 DIAGNOSIS — R2689 Other abnormalities of gait and mobility: Secondary | ICD-10-CM | POA: Diagnosis not present

## 2020-08-29 DIAGNOSIS — R69 Illness, unspecified: Secondary | ICD-10-CM | POA: Diagnosis not present

## 2020-08-31 ENCOUNTER — Other Ambulatory Visit: Payer: Self-pay

## 2020-08-31 ENCOUNTER — Emergency Department (HOSPITAL_COMMUNITY): Payer: Medicare HMO

## 2020-08-31 ENCOUNTER — Inpatient Hospital Stay (HOSPITAL_COMMUNITY)
Admission: EM | Admit: 2020-08-31 | Discharge: 2020-09-12 | DRG: 306 | Disposition: E | Payer: Medicare HMO | Attending: Internal Medicine | Admitting: Internal Medicine

## 2020-08-31 ENCOUNTER — Encounter (HOSPITAL_COMMUNITY): Payer: Self-pay

## 2020-08-31 DIAGNOSIS — I340 Nonrheumatic mitral (valve) insufficiency: Secondary | ICD-10-CM | POA: Diagnosis not present

## 2020-08-31 DIAGNOSIS — J449 Chronic obstructive pulmonary disease, unspecified: Secondary | ICD-10-CM | POA: Diagnosis present

## 2020-08-31 DIAGNOSIS — M797 Fibromyalgia: Secondary | ICD-10-CM | POA: Diagnosis present

## 2020-08-31 DIAGNOSIS — E782 Mixed hyperlipidemia: Secondary | ICD-10-CM | POA: Diagnosis present

## 2020-08-31 DIAGNOSIS — I110 Hypertensive heart disease with heart failure: Secondary | ICD-10-CM | POA: Diagnosis not present

## 2020-08-31 DIAGNOSIS — Z20822 Contact with and (suspected) exposure to covid-19: Secondary | ICD-10-CM | POA: Diagnosis present

## 2020-08-31 DIAGNOSIS — Z809 Family history of malignant neoplasm, unspecified: Secondary | ICD-10-CM

## 2020-08-31 DIAGNOSIS — E119 Type 2 diabetes mellitus without complications: Secondary | ICD-10-CM | POA: Diagnosis not present

## 2020-08-31 DIAGNOSIS — J9601 Acute respiratory failure with hypoxia: Secondary | ICD-10-CM | POA: Diagnosis not present

## 2020-08-31 DIAGNOSIS — F0390 Unspecified dementia without behavioral disturbance: Secondary | ICD-10-CM | POA: Diagnosis present

## 2020-08-31 DIAGNOSIS — G8929 Other chronic pain: Secondary | ICD-10-CM | POA: Diagnosis present

## 2020-08-31 DIAGNOSIS — R0689 Other abnormalities of breathing: Secondary | ICD-10-CM | POA: Diagnosis not present

## 2020-08-31 DIAGNOSIS — N179 Acute kidney failure, unspecified: Secondary | ICD-10-CM | POA: Diagnosis present

## 2020-08-31 DIAGNOSIS — M8580 Other specified disorders of bone density and structure, unspecified site: Secondary | ICD-10-CM | POA: Diagnosis present

## 2020-08-31 DIAGNOSIS — M1990 Unspecified osteoarthritis, unspecified site: Secondary | ICD-10-CM | POA: Diagnosis present

## 2020-08-31 DIAGNOSIS — Z515 Encounter for palliative care: Secondary | ICD-10-CM

## 2020-08-31 DIAGNOSIS — Z8249 Family history of ischemic heart disease and other diseases of the circulatory system: Secondary | ICD-10-CM

## 2020-08-31 DIAGNOSIS — Z85038 Personal history of other malignant neoplasm of large intestine: Secondary | ICD-10-CM

## 2020-08-31 DIAGNOSIS — I447 Left bundle-branch block, unspecified: Secondary | ICD-10-CM | POA: Diagnosis not present

## 2020-08-31 DIAGNOSIS — Z66 Do not resuscitate: Secondary | ICD-10-CM | POA: Diagnosis present

## 2020-08-31 DIAGNOSIS — R69 Illness, unspecified: Secondary | ICD-10-CM | POA: Diagnosis not present

## 2020-08-31 DIAGNOSIS — Z833 Family history of diabetes mellitus: Secondary | ICD-10-CM | POA: Diagnosis not present

## 2020-08-31 DIAGNOSIS — J80 Acute respiratory distress syndrome: Secondary | ICD-10-CM | POA: Diagnosis not present

## 2020-08-31 DIAGNOSIS — I509 Heart failure, unspecified: Secondary | ICD-10-CM

## 2020-08-31 DIAGNOSIS — I5032 Chronic diastolic (congestive) heart failure: Secondary | ICD-10-CM | POA: Diagnosis present

## 2020-08-31 DIAGNOSIS — J810 Acute pulmonary edema: Secondary | ICD-10-CM | POA: Diagnosis not present

## 2020-08-31 DIAGNOSIS — J440 Chronic obstructive pulmonary disease with acute lower respiratory infection: Secondary | ICD-10-CM | POA: Diagnosis present

## 2020-08-31 DIAGNOSIS — I361 Nonrheumatic tricuspid (valve) insufficiency: Secondary | ICD-10-CM | POA: Diagnosis not present

## 2020-08-31 DIAGNOSIS — I5021 Acute systolic (congestive) heart failure: Secondary | ICD-10-CM | POA: Diagnosis not present

## 2020-08-31 DIAGNOSIS — I350 Nonrheumatic aortic (valve) stenosis: Secondary | ICD-10-CM | POA: Diagnosis not present

## 2020-08-31 DIAGNOSIS — I2699 Other pulmonary embolism without acute cor pulmonale: Secondary | ICD-10-CM | POA: Diagnosis not present

## 2020-08-31 DIAGNOSIS — Z96643 Presence of artificial hip joint, bilateral: Secondary | ICD-10-CM | POA: Diagnosis present

## 2020-08-31 DIAGNOSIS — I5023 Acute on chronic systolic (congestive) heart failure: Secondary | ICD-10-CM | POA: Diagnosis not present

## 2020-08-31 DIAGNOSIS — Z853 Personal history of malignant neoplasm of breast: Secondary | ICD-10-CM

## 2020-08-31 DIAGNOSIS — I083 Combined rheumatic disorders of mitral, aortic and tricuspid valves: Principal | ICD-10-CM | POA: Diagnosis present

## 2020-08-31 DIAGNOSIS — E875 Hyperkalemia: Secondary | ICD-10-CM | POA: Diagnosis not present

## 2020-08-31 DIAGNOSIS — Z87891 Personal history of nicotine dependence: Secondary | ICD-10-CM

## 2020-08-31 DIAGNOSIS — R0602 Shortness of breath: Secondary | ICD-10-CM | POA: Diagnosis not present

## 2020-08-31 DIAGNOSIS — R42 Dizziness and giddiness: Secondary | ICD-10-CM | POA: Diagnosis not present

## 2020-08-31 DIAGNOSIS — J189 Pneumonia, unspecified organism: Secondary | ICD-10-CM

## 2020-08-31 DIAGNOSIS — E785 Hyperlipidemia, unspecified: Secondary | ICD-10-CM

## 2020-08-31 DIAGNOSIS — R0902 Hypoxemia: Secondary | ICD-10-CM | POA: Diagnosis not present

## 2020-08-31 DIAGNOSIS — J9811 Atelectasis: Secondary | ICD-10-CM | POA: Diagnosis not present

## 2020-08-31 DIAGNOSIS — J90 Pleural effusion, not elsewhere classified: Secondary | ICD-10-CM | POA: Diagnosis not present

## 2020-08-31 DIAGNOSIS — D72829 Elevated white blood cell count, unspecified: Secondary | ICD-10-CM | POA: Diagnosis not present

## 2020-08-31 LAB — CBC WITH DIFFERENTIAL/PLATELET
Abs Immature Granulocytes: 0.09 10*3/uL — ABNORMAL HIGH (ref 0.00–0.07)
Basophils Absolute: 0.1 10*3/uL (ref 0.0–0.1)
Basophils Relative: 0 %
Eosinophils Absolute: 0.1 10*3/uL (ref 0.0–0.5)
Eosinophils Relative: 1 %
HCT: 50.8 % — ABNORMAL HIGH (ref 36.0–46.0)
Hemoglobin: 16 g/dL — ABNORMAL HIGH (ref 12.0–15.0)
Immature Granulocytes: 1 %
Lymphocytes Relative: 28 %
Lymphs Abs: 4.4 10*3/uL — ABNORMAL HIGH (ref 0.7–4.0)
MCH: 33.6 pg (ref 26.0–34.0)
MCHC: 31.5 g/dL (ref 30.0–36.0)
MCV: 106.7 fL — ABNORMAL HIGH (ref 80.0–100.0)
Monocytes Absolute: 1.3 10*3/uL — ABNORMAL HIGH (ref 0.1–1.0)
Monocytes Relative: 9 %
Neutro Abs: 9.6 10*3/uL — ABNORMAL HIGH (ref 1.7–7.7)
Neutrophils Relative %: 61 %
Platelets: 186 10*3/uL (ref 150–400)
RBC: 4.76 MIL/uL (ref 3.87–5.11)
RDW: 12.9 % (ref 11.5–15.5)
WBC: 15.5 10*3/uL — ABNORMAL HIGH (ref 4.0–10.5)
nRBC: 0 % (ref 0.0–0.2)

## 2020-08-31 LAB — RESP PANEL BY RT PCR (RSV, FLU A&B, COVID)
Influenza A by PCR: NEGATIVE
Influenza B by PCR: NEGATIVE
Respiratory Syncytial Virus by PCR: NEGATIVE
SARS Coronavirus 2 by RT PCR: NEGATIVE

## 2020-08-31 LAB — COMPREHENSIVE METABOLIC PANEL
ALT: 13 U/L (ref 0–44)
AST: 31 U/L (ref 15–41)
Albumin: 3.9 g/dL (ref 3.5–5.0)
Alkaline Phosphatase: 49 U/L (ref 38–126)
Anion gap: 17 — ABNORMAL HIGH (ref 5–15)
BUN: 18 mg/dL (ref 8–23)
CO2: 21 mmol/L — ABNORMAL LOW (ref 22–32)
Calcium: 9.4 mg/dL (ref 8.9–10.3)
Chloride: 103 mmol/L (ref 98–111)
Creatinine, Ser: 1 mg/dL (ref 0.44–1.00)
GFR, Estimated: 47 mL/min — ABNORMAL LOW (ref >60)
Glucose, Bld: 307 mg/dL — ABNORMAL HIGH (ref 70–99)
Potassium: 4 mmol/L (ref 3.5–5.1)
Sodium: 141 mmol/L (ref 135–145)
Total Bilirubin: 1 mg/dL (ref 0.3–1.2)
Total Protein: 7.1 g/dL (ref 6.5–8.1)

## 2020-08-31 LAB — I-STAT ARTERIAL BLOOD GAS, ED
Acid-base deficit: 1 mmol/L (ref 0.0–2.0)
Bicarbonate: 25.6 mmol/L (ref 20.0–28.0)
Calcium, Ion: 1.26 mmol/L (ref 1.15–1.40)
HCT: 47 % — ABNORMAL HIGH (ref 36.0–46.0)
Hemoglobin: 16 g/dL — ABNORMAL HIGH (ref 12.0–15.0)
O2 Saturation: 98 %
Potassium: 4.6 mmol/L (ref 3.5–5.1)
Sodium: 140 mmol/L (ref 135–145)
TCO2: 27 mmol/L (ref 22–32)
pCO2 arterial: 48.3 mmHg — ABNORMAL HIGH (ref 32.0–48.0)
pH, Arterial: 7.332 — ABNORMAL LOW (ref 7.350–7.450)
pO2, Arterial: 105 mmHg (ref 83.0–108.0)

## 2020-08-31 LAB — TROPONIN I (HIGH SENSITIVITY): Troponin I (High Sensitivity): 2897 ng/L (ref <18)

## 2020-08-31 LAB — LACTIC ACID, PLASMA: Lactic Acid, Venous: 2.2 mmol/L (ref 0.5–1.9)

## 2020-08-31 LAB — BRAIN NATRIURETIC PEPTIDE: B Natriuretic Peptide: 2794.6 pg/mL — ABNORMAL HIGH (ref 0.0–100.0)

## 2020-08-31 LAB — PROCALCITONIN: Procalcitonin: 1.07 ng/mL

## 2020-08-31 MED ORDER — DICLOFENAC SODIUM 1 % EX GEL
2.0000 g | Freq: Four times a day (QID) | CUTANEOUS | Status: DC
  Administered 2020-08-31 – 2020-09-01 (×4): 2 g via TOPICAL
  Filled 2020-08-31: qty 100

## 2020-08-31 MED ORDER — HYDROCHLOROTHIAZIDE 12.5 MG PO CAPS: 12.5000 mg | ORAL_CAPSULE | Freq: Every day | ORAL | Status: DC

## 2020-08-31 MED ORDER — SODIUM CHLORIDE 0.9% FLUSH: 3.0000 mL | INTRAVENOUS | Status: DC | PRN

## 2020-08-31 MED ORDER — IOHEXOL 350 MG/ML SOLN
75.0000 mL | Freq: Once | INTRAVENOUS | Status: AC | PRN
  Administered 2020-08-31: 75 mL via INTRAVENOUS

## 2020-08-31 MED ORDER — ONDANSETRON HCL 4 MG/2ML IJ SOLN: 4.0000 mg | Freq: Four times a day (QID) | INTRAMUSCULAR | Status: DC | PRN

## 2020-08-31 MED ORDER — ACETAMINOPHEN 325 MG PO TABS
650.0000 mg | ORAL_TABLET | Freq: Four times a day (QID) | ORAL | Status: DC | PRN
  Filled 2020-08-31: qty 2

## 2020-08-31 MED ORDER — IPRATROPIUM-ALBUTEROL 0.5-2.5 (3) MG/3ML IN SOLN
3.0000 mL | Freq: Four times a day (QID) | RESPIRATORY_TRACT | Status: DC
  Administered 2020-08-31 – 2020-09-01 (×2): 3 mL via RESPIRATORY_TRACT
  Filled 2020-08-31 (×2): qty 3

## 2020-08-31 MED ORDER — SODIUM CHLORIDE 0.9 % IV SOLN
1.0000 g | Freq: Once | INTRAVENOUS | Status: AC
  Administered 2020-08-31: 1 g via INTRAVENOUS
  Filled 2020-08-31: qty 10

## 2020-08-31 MED ORDER — FUROSEMIDE 10 MG/ML IJ SOLN
40.0000 mg | Freq: Once | INTRAMUSCULAR | Status: AC
  Administered 2020-08-31: 40 mg via INTRAVENOUS
  Filled 2020-08-31: qty 4

## 2020-08-31 MED ORDER — SODIUM CHLORIDE 0.9 % IV SOLN
250.0000 mL | INTRAVENOUS | Status: DC | PRN
  Administered 2020-09-03: 250 mL via INTRAVENOUS

## 2020-08-31 MED ORDER — SODIUM CHLORIDE 0.9% FLUSH
3.0000 mL | Freq: Two times a day (BID) | INTRAVENOUS | Status: DC
  Administered 2020-08-31 – 2020-09-02 (×4): 3 mL via INTRAVENOUS

## 2020-08-31 MED ORDER — SODIUM CHLORIDE 0.9 % IV SOLN: 2.0000 g | INTRAVENOUS | Status: DC

## 2020-08-31 MED ORDER — ENOXAPARIN SODIUM 30 MG/0.3ML ~~LOC~~ SOLN
30.0000 mg | SUBCUTANEOUS | Status: DC
  Administered 2020-09-01: 30 mg via SUBCUTANEOUS
  Filled 2020-08-31: qty 0.3

## 2020-08-31 MED ORDER — SODIUM CHLORIDE 0.9 % IV SOLN
500.0000 mg | Freq: Once | INTRAVENOUS | Status: AC
  Administered 2020-08-31: 500 mg via INTRAVENOUS
  Filled 2020-08-31: qty 500

## 2020-08-31 MED ORDER — FUROSEMIDE 10 MG/ML IJ SOLN: 20.0000 mg | Freq: Once | INTRAMUSCULAR | Status: DC

## 2020-08-31 MED ORDER — LISINOPRIL 2.5 MG PO TABS: 2.5000 mg | ORAL_TABLET | Freq: Every day | ORAL | Status: DC

## 2020-08-31 MED ORDER — POLYETHYLENE GLYCOL 3350 17 G PO PACK
17.0000 g | PACK | Freq: Every day | ORAL | Status: DC
  Administered 2020-09-01: 17 g via ORAL
  Filled 2020-08-31: qty 1

## 2020-08-31 MED ORDER — LORATADINE 10 MG PO TABS
10.0000 mg | ORAL_TABLET | Freq: Every day | ORAL | Status: DC
  Administered 2020-09-01 – 2020-09-02 (×2): 10 mg via ORAL
  Filled 2020-08-31 (×2): qty 1

## 2020-08-31 MED ORDER — ADULT MULTIVITAMIN W/MINERALS CH: 1.0000 | ORAL_TABLET | Freq: Every day | ORAL | Status: DC

## 2020-08-31 MED ORDER — ISOSORBIDE MONONITRATE ER 30 MG PO TB24: 30.0000 mg | ORAL_TABLET | Freq: Every day | ORAL | Status: DC

## 2020-08-31 MED ORDER — SODIUM CHLORIDE 0.9 % IV SOLN: 500.0000 mg | INTRAVENOUS | Status: DC

## 2020-08-31 NOTE — H&P (Addendum)
History and Physical    Julie Walter QIW:[id number] DOB: 08-13-1919 DOA: 09/02/2020  PCP: Lauree Chandler, NP (Confirm with patient/family/NH records and if not entered, this has to be entered at [facility] point of entry) Patient coming from: Assisted living  I have personally briefly reviewed patient's old medical records in [place]  Chief Complaint: Feeling dizzy  HPI: Julie Walter is a 86 y.o. female with medical history significant of aortic stenosis, hypertension, ambulation dysfunction, presented with feeling dizzy and hypoxia.  Patient has a history of severe aortic stenosis diagnosed in [place] 3 years ago, but none cardiology there proposed TAVR procedure patient declined the option rescinding her age and other conditions.  Patient moved to live in the area and lost follow-up with cardiology for last 3 years.  Lately patient has had couple episodes of feeling lightheadedness and decreasing activity tolerance, she can barely walk for more than 5 minutes without shortness of breath and she has decreased her daily walking to 2 times weekly.  This morning, patient was sitting in chair and again started to feel strong feeling of lightheadedness and could not stand up.  She called for help and staff arrived and found patient in respiratory distress patient however does not remember she was in shortness of breath.  Patient was found O2 saturation 65 and was placed on BiPAP and given magnesium Solu-Medrol and albuterol.  Patient also reported left-sided chest pain pressure-like radiation to the back 5-6/10.  She further reported that she has had this type of pain occasionally but seems not related to activity. ED Course: CTA negative for PE, patient was stabilized on BiPAP than switch to nonrebreather.  WBC 15.5, glucose 307.  Review of Systems: As per HPI otherwise 14 point review of systems negative.    Past Medical History:  Diagnosis Date  . Anemia   . Aortic stenosis   .  Arthritis   . Breast cancer ([place])   . Carpal tunnel syndrome   . Chronic pain   . Colon cancer ([place])   . Fibromyalgia   . Hematuria   . History of hip replacement    bilateral  . Hyperglycemia   . Hyperlipidemia   . Hypertension   . Lumbago   . Memory loss   . Osteoarthritis   . Osteopenia     Past Surgical History:  Procedure Laterality Date  . ABDOMINAL HYSTERECTOMY  1958  . BREAST LUMPECTOMY  2008  . CHOLECYSTECTOMY  2002  . colon cancer tumor  1993   Dr. Jens Som  . hip replacements     2013 and 2014     reports that she quit smoking about 33 years ago. She has a 40.00 pack-year smoking history. She has never used smokeless tobacco. She reports current alcohol use. She reports that she does not use drugs.  No Known Allergies  Family History  Problem Relation Age of Onset  . Cancer Mother   . Heart disease Mother   . Cancer Sister   . Cancer Brother   . Cancer Sister   . Heart failure Brother   . Cancer Sister   . Heart failure Brother   . Hemachromatosis Son   . Diabetes Son      Prior to Admission medications   Medication Sig Start Date End Date Taking? Authorizing Provider  acetaminophen (TYLENOL) 500 MG tablet Take 500 mg by mouth as needed (pain).    Yes [provider]  cetirizine (ZYRTEC) 10 MG tablet Take 10 mg  by mouth as needed for allergies.   Yes [provider]  diclofenac sodium (VOLTAREN) 1 % GEL APPLY 2 GRAMS TO THE AFFECTED AREA DAILY. Patient taking differently: Apply 2 g topically daily.  01/26/19  Yes Lauree Chandler, NP  hydrochlorothiazide (HYDRODIURIL) 25 MG tablet TAKE 1/2 TABLET BY MOUTH EVERY DAY Patient taking differently: Take 12.5 mg by mouth daily.  08/05/20  Yes Lauree Chandler, NP  isosorbide mononitrate (IMDUR) 30 MG 24 hr tablet TAKE 1 TABLET BY MOUTH EVERY DAY Patient taking differently: Take 30 mg by mouth daily.  05/09/20  Yes Lauree Chandler, NP  Multiple Vitamin (MULTIVITAMIN WITH MINERALS) TABS  tablet Take 1 tablet by mouth daily.   Yes [provider]  polyethylene glycol (MIRALAX / GLYCOLAX) packet Take 17 g by mouth every other day.    Yes [provider]  TURMERIC PO Take 1 capsule by mouth in the morning and at bedtime.   Yes [provider]    Physical Exam: Vitals:   09/04/2020 1152 08/30/2020 1155  BP: (!) 152/64   Pulse: (!) 55   Resp: (!) 29   Temp: 97.6 F (36.4 C)   TempSrc: Oral   SpO2: 95% 92%    Constitutional: NAD, calm, comfortable Vitals:   09/05/2020 1152 08/12/2020 1155  BP: (!) 152/64   Pulse: (!) 55   Resp: (!) 29   Temp: 97.6 F (36.4 C)   TempSrc: Oral   SpO2: 95% 92%   Eyes: PERRL, lids and conjunctivae normal ENMT: Mucous membranes are moist. Posterior pharynx clear of any exudate or lesions.Normal dentition.  Neck: normal, supple, no masses, no thyromegaly Respiratory: clear to auscultation bilaterally, scattered wheezing, fine crackles to bilateral mid fields.  Increased respiratory effort. Talking in broken sentences, no accessory muscle use.  Cardiovascular: Regular rate and rhythm, loud systolic murmur on heart base, diminished S2.  2+ extremity edema mainly on the bilateral calf and behind ankles. 2+ pedal pulses. No carotid bruits.  Abdomen: no tenderness, no masses palpated. No hepatosplenomegaly. Bowel sounds positive.  Musculoskeletal: no clubbing / cyanosis. No joint deformity upper and lower extremities. Good ROM, no contractures. Normal muscle tone.  Skin: no rashes, lesions, ulcers. No induration Neurologic: CN 2-12 grossly intact. Sensation intact, DTR normal. Strength 5/5 in all 4.  Psychiatric: Normal judgment and insight. Alert and oriented x 3. Normal mood.     Labs on Admission: I have personally reviewed following labs and imaging studies  CBC: Recent Labs  Lab 08/23/2020 1206 08/27/2020 1557  WBC 15.5*  --   NEUTROABS 9.6*  --   HGB 16.0* 16.0*  HCT 50.8* 47.0*  MCV 106.7*  --   PLT 186  --      Basic Metabolic Panel: Recent Labs  Lab 08/30/2020 1206 08/20/2020 1557  NA 141 140  K 4.0 4.6  CL 103  --   CO2 21*  --   GLUCOSE 307*  --   BUN 18  --   CREATININE 1.00  --   CALCIUM 9.4  --    GFR: CrCl cannot be calculated (Unknown ideal weight.). Liver Function Tests: Recent Labs  Lab 08/16/2020 1206  AST 31  ALT 13  ALKPHOS 49  BILITOT 1.0  PROT 7.1  ALBUMIN 3.9   No results for input(s): LIPASE, AMYLASE in the last 168 hours. No results for input(s): AMMONIA in the last 168 hours. Coagulation Profile: No results for input(s): INR, PROTIME in the last 168 hours. Cardiac  Enzymes: No results for input(s): CKTOTAL, CKMB, CKMBINDEX, TROPONINI in the last 168 hours. BNP (last 3 results) No results for input(s): PROBNP in the last 8760 hours. HbA1C: No results for input(s): HGBA1C in the last 72 hours. CBG: No results for input(s): GLUCAP in the last 168 hours. Lipid Profile: No results for input(s): CHOL, HDL, LDLCALC, TRIG, CHOLHDL, LDLDIRECT in the last 72 hours. Thyroid Function Tests: No results for input(s): TSH, T4TOTAL, FREET4, T3FREE, THYROIDAB in the last 72 hours. Anemia Panel: No results for input(s): VITAMINB12, FOLATE, FERRITIN, TIBC, IRON, RETICCTPCT in the last 72 hours. Urine analysis:    Component Value Date/Time   COLORURINE YELLOW 03/28/2018 0559   APPEARANCEUR HAZY (A) 03/28/2018 0559   LABSPEC 1.015 03/28/2018 0559   PHURINE 5.0 03/28/2018 0559   GLUCOSEU NEGATIVE 03/28/2018 0559   HGBUR NEGATIVE 03/28/2018 0559   BILIRUBINUR neg 10/29/2018 1606   KETONESUR NEGATIVE 03/28/2018 0559   PROTEINUR Negative 10/29/2018 1606   PROTEINUR NEGATIVE 03/28/2018 0559   UROBILINOGEN 0.2 10/29/2018 1606   NITRITE neg 10/29/2018 1606   NITRITE NEGATIVE 03/28/2018 0559   LEUKOCYTESUR Negative 10/29/2018 1606    Radiological Exams on Admission: CT Angio Chest PE W/Cm &/Or Wo Cm  Result Date: 08/30/2020 CLINICAL DATA:  Shortness of breath EXAM: CT  ANGIOGRAPHY CHEST WITH CONTRAST TECHNIQUE: Multidetector CT imaging of the chest was performed using the standard protocol during bolus administration of intravenous contrast. Multiplanar CT image reconstructions and MIPs were obtained to evaluate the vascular anatomy. CONTRAST:  29mL OMNIPAQUE IOHEXOL 350 MG/ML SOLN COMPARISON:  None. FINDINGS: Cardiovascular: Satisfactory opacification of the pulmonary arteries to the segmental level. No evidence of pulmonary embolism. Normal heart size. No pericardial effusion. Coronary artery calcification. Thoracic aorta is normal in caliber with atherosclerotic calcification. Mediastinum/Nodes: No enlarged lymph nodes. Thyroid is unremarkable. Lungs/Pleura: Patchy bilateral ground-glass density and consolidation with basilar predominance. Small, right greater than left pleural effusions. No pneumothorax. Upper Abdomen: No acute abnormality Musculoskeletal: Multilevel degenerative changes of the included spine. No acute abnormality. Review of the MIP images confirms the above findings. IMPRESSION: No acute pulmonary embolism. Bilateral pulmonary opacities. Small bilateral pleural effusions. May reflect pulmonary edema or atypical/viral pneumonia. Electronically Signed   By: Macy Mis M.D.   On: 08/12/2020 14:58   DG Chest Port 1 View  Result Date: 09/02/2020 CLINICAL DATA:  Shortness of breath EXAM: PORTABLE CHEST 1 VIEW COMPARISON:  None. FINDINGS: Heart size is upper limits of normal. Atherosclerotic calcification of the aortic knob. Dense right basilar consolidation. Small right pleural effusion. Prominent interstitial markings throughout both lungs. No pneumothorax. Advanced degenerative changes of the bilateral glenohumeral joints. IMPRESSION: Dense right basilar consolidation with small right pleural effusion. Findings may represent a combination of atelectasis and pneumonia. Radiographic follow-up to resolution is recommended. Electronically Signed   By:  Davina Poke D.O.   On: 08/25/2020 12:35    EKG: Independently reviewed.  LBBB chronic  Assessment/Plan Active Problems:   Aortic stenosis   CHF (congestive heart failure) ([place])  (please populate well all problems here in Problem List. (For example, if patient is on BP meds at home and you resume or decide to hold them, it is a problem that needs to be her. Same for CAD, COPD, HLD and so on)  Acute on chronic CHF decompensation, likely systolic -She has underlying severe aortic stenosis, EKG showed LBBB, physical exam showed diminished S2 implying a severe aortic stenosis with LBBB very likely patient has systolic CHF, repeat echo and consult  cardiology. -Lasix 20 mg x 1.  She has signs of fluid overload however overall should watch her volume status closely given the severity of aortic stenosis. -Fluid restriction, DuoNeb  Acute hypoxic respite failure -As needed BiPAP, diuresis as above.  Leukocytosis -CT angiogram showed infiltrates are patchy bilateral, less likely she has bilateral pneumonia, will send a procalcitonin level, she received 1 dose of antibiotics.  Overall, less likely pneumonia.  Severe aortic stenosis -She has near syncope, CHF as well as angina all indicating poor prognosis.  Discussed with patient at bedside and her daughter over the phone, made both of them aware the severity of her conditions.  Confirmed the patient is DNR.  HTN -Hold her HCTZ now.  DVT prophylaxis: Lovenox Code Status: DNR Family Communication: Daughter over the phone Disposition Plan: Overall condition sick, likely will need more than 2 midnight hospital stay Consults called: Cardiology, consider palliative care after echo Admission status: PCU   Lequita Halt MD [facility] Pager [phone]  09/07/2020, 5:42 PM

## 2020-08-31 NOTE — ED Notes (Signed)
Pt transitioned from Bipap to NRB with RT present. Pt maintained Sp02 of >93% thereafter.

## 2020-08-31 NOTE — Progress Notes (Signed)
Transported to CT via bipap with no complications noted.

## 2020-08-31 NOTE — ED Provider Notes (Signed)
[facility] EMERGENCY DEPARTMENT Provider Note   CSN: [id number] Arrival date & time: 08/26/2020  1150     History Chief Complaint  Patient presents with  . Shortness of Breath    Julie Walter is a 86 y.o. female.  Patient brought in for [facility] assisted living.  According to EMS patient with sudden onset of shortness of breath since 7 this morning.  Patient was 65% on room air when EMS arrived.  Patient reports being fully vaccinated against Covid and received her flu shot on Monday.  EMS gave her albuterol Solu-Medrol 125 mg and magnesium 2 g.  They did not hear any wheezing but they said she was not moving air well.  They had her on their CPAP.  But then switched her over to a 15 L nonrebreather upon arrival to the emergency department.  Patient was noted to be having some extremitas with breathing with a lot of accessory muscle use.  And very tachypneic.  Patient placed on BiPAP here.  Patient more comfortable on the BiPAP.  Patient not normally on oxygen.  Patient does have a history of dementia.  In addition past medical history significant for aortic stenosis.  But cannot see any cardiology follow-up recently.  But is followed by [facility].  Patient has history of fibromyalgia hypertension hyperlipidemia.  Recent notes from [facility] talk about her having some intermittent episodes of shortness of breath.  Patient without any leg swelling here today.        Past Medical History:  Diagnosis Date  . Anemia   . Aortic stenosis   . Arthritis   . Breast cancer ([place])   . Carpal tunnel syndrome   . Chronic pain   . Colon cancer ([place])   . Fibromyalgia   . Hematuria   . History of hip replacement    bilateral  . Hyperglycemia   . Hyperlipidemia   . Hypertension   . Lumbago   . Memory loss   . Osteoarthritis   . Osteopenia     Patient Active Problem List   Diagnosis Date Noted  . Essential hypertension 01/10/2018  . Mixed  hyperlipidemia 01/10/2018  . Multiple joint pain 01/10/2018  . Tremor, essential 01/10/2018  . Left sided sciatica 01/10/2018  . Lumbar radiculopathy 01/10/2018  . Aortic stenosis, severe 01/10/2018  . Type 2 diabetes mellitus without complication, without long-term current use of insulin ([place]) 01/10/2018    Past Surgical History:  Procedure Laterality Date  . ABDOMINAL HYSTERECTOMY  1958  . BREAST LUMPECTOMY  2008  . CHOLECYSTECTOMY  2002  . colon cancer tumor  1993   Dr. Jens Som  . hip replacements     2013 and 2014     OB History   No obstetric history on file.     Family History  Problem Relation Age of Onset  . Cancer Mother   . Heart disease Mother   . Cancer Sister   . Cancer Brother   . Cancer Sister   . Heart failure Brother   . Cancer Sister   . Heart failure Brother   . Hemachromatosis Son   . Diabetes Son     Social History   Tobacco Use  . Smoking status: Former Smoker    Packs/day: 2.00    Years: 20.00    Pack years: 40.00    Quit date: 11/12/1986    Years since quitting: 33.8  . Smokeless tobacco: Never Used  . Tobacco comment: quit  25 years ago as of 2019   Vaping Use  . Vaping Use: Never used  Substance Use Topics  . Alcohol use: Yes    Comment: 1 glass of wine every couple weeks  . Drug use: No    Home Medications Prior to Admission medications   Medication Sig Start Date End Date Taking? Authorizing Provider  acetaminophen (TYLENOL) 500 MG tablet Take 500 mg by mouth as needed.    [provider]  cetirizine (ZYRTEC) 10 MG tablet Take 10 mg by mouth as needed for allergies.    [provider]  diclofenac sodium (VOLTAREN) 1 % GEL APPLY 2 GRAMS TO THE AFFECTED AREA DAILY. 01/26/19   Lauree Chandler, NP  hydrochlorothiazide (HYDRODIURIL) 25 MG tablet TAKE 1/2 TABLET BY MOUTH EVERY DAY Patient taking differently: Take 12.5 mg by mouth daily.  08/05/20   Lauree Chandler, NP  isosorbide mononitrate (IMDUR) 30 MG 24 hr  tablet TAKE 1 TABLET BY MOUTH EVERY DAY Patient taking differently: Take 30 mg by mouth daily.  05/09/20   Lauree Chandler, NP  Multiple Vitamin (MULTIVITAMIN WITH MINERALS) TABS tablet Take 1 tablet by mouth daily.    [provider]  polyethylene glycol (MIRALAX / GLYCOLAX) packet Take 17 g by mouth daily.     [provider]    Allergies    Patient has no known allergies.  Review of Systems   Review of Systems  Unable to perform ROS: Dementia    Physical Exam Updated Vital Signs BP (!) 152/64   Pulse (!) 55   Temp 97.6 F (36.4 C) (Oral)   Resp (!) 29   SpO2 92%   Physical Exam Vitals and nursing note reviewed.  Constitutional:      General: She is in acute distress.     Appearance: Normal appearance. She is well-developed.  HENT:     Head: Normocephalic and atraumatic.  Eyes:     Extraocular Movements: Extraocular movements intact.     Conjunctiva/sclera: Conjunctivae normal.     Pupils: Pupils are equal, round, and reactive to light.  Cardiovascular:     Rate and Rhythm: Normal rate and regular rhythm.     Heart sounds: No murmur heard.   Pulmonary:     Effort: Respiratory distress present.     Breath sounds: Normal breath sounds. No wheezing or rales.  Abdominal:     Palpations: Abdomen is soft.     Tenderness: There is no abdominal tenderness.  Musculoskeletal:        General: No swelling.     Cervical back: Neck supple.     Right lower leg: No edema.     Left lower leg: No edema.  Skin:    General: Skin is warm and dry.     Capillary Refill: Capillary refill takes less than 2 seconds.  Neurological:     General: No focal deficit present.     Mental Status: She is alert.     Motor: No weakness.     Comments: Patient alert and will answer questions.     ED Results / Procedures / Treatments   Labs (all labs ordered are listed, but only abnormal results are displayed) Labs Reviewed  COMPREHENSIVE METABOLIC PANEL - Abnormal;  Notable for the following components:      Result Value   CO2 21 (*)    Glucose, Bld 307 (*)    GFR, Estimated 47 (*)    Anion gap 17 (*)  All other components within normal limits  CBC WITH DIFFERENTIAL/PLATELET - Abnormal; Notable for the following components:   WBC 15.5 (*)    Hemoglobin 16.0 (*)    HCT 50.8 (*)    MCV 106.7 (*)    Neutro Abs 9.6 (*)    Lymphs Abs 4.4 (*)    Monocytes Absolute 1.3 (*)    Abs Immature Granulocytes 0.09 (*)    All other components within normal limits  RESP PANEL BY RT PCR (RSV, FLU A&B, COVID)  CULTURE, BLOOD (ROUTINE X 2)  CULTURE, BLOOD (ROUTINE X 2)  URINALYSIS, ROUTINE W REFLEX MICROSCOPIC  LACTIC ACID, PLASMA  BRAIN NATRIURETIC PEPTIDE  I-STAT ARTERIAL BLOOD GAS, ED  TROPONIN I (HIGH SENSITIVITY)    EKG EKG Interpretation  Date/Time:  Wednesday August 31 2020 11:52:32 EDT Ventricular Rate:  93 PR Interval:    QRS Duration: 144 QT Interval:  382 QTC Calculation: 476 R Axis:   -53 Text Interpretation: Sinus rhythm Atrial premature complexes Sinus pause LAE, consider biatrial enlargement Left bundle branch block Confirmed by Fredia Sorrow [phone]) on 09/06/2020 12:02:44 PM   Radiology CT Angio Chest PE W/Cm &/Or Wo Cm  Result Date: 09/07/2020 CLINICAL DATA:  Shortness of breath EXAM: CT ANGIOGRAPHY CHEST WITH CONTRAST TECHNIQUE: Multidetector CT imaging of the chest was performed using the standard protocol during bolus administration of intravenous contrast. Multiplanar CT image reconstructions and MIPs were obtained to evaluate the vascular anatomy. CONTRAST:  67mL OMNIPAQUE IOHEXOL 350 MG/ML SOLN COMPARISON:  None. FINDINGS: Cardiovascular: Satisfactory opacification of the pulmonary arteries to the segmental level. No evidence of pulmonary embolism. Normal heart size. No pericardial effusion. Coronary artery calcification. Thoracic aorta is normal in caliber with atherosclerotic calcification. Mediastinum/Nodes: No enlarged  lymph nodes. Thyroid is unremarkable. Lungs/Pleura: Patchy bilateral ground-glass density and consolidation with basilar predominance. Small, right greater than left pleural effusions. No pneumothorax. Upper Abdomen: No acute abnormality Musculoskeletal: Multilevel degenerative changes of the included spine. No acute abnormality. Review of the MIP images confirms the above findings. IMPRESSION: No acute pulmonary embolism. Bilateral pulmonary opacities. Small bilateral pleural effusions. May reflect pulmonary edema or atypical/viral pneumonia. Electronically Signed   By: Macy Mis M.D.   On: 08/18/2020 14:58   DG Chest Port 1 View  Result Date: 08/30/2020 CLINICAL DATA:  Shortness of breath EXAM: PORTABLE CHEST 1 VIEW COMPARISON:  None. FINDINGS: Heart size is upper limits of normal. Atherosclerotic calcification of the aortic knob. Dense right basilar consolidation. Small right pleural effusion. Prominent interstitial markings throughout both lungs. No pneumothorax. Advanced degenerative changes of the bilateral glenohumeral joints. IMPRESSION: Dense right basilar consolidation with small right pleural effusion. Findings may represent a combination of atelectasis and pneumonia. Radiographic follow-up to resolution is recommended. Electronically Signed   By: Davina Poke D.O.   On: 09/08/2020 12:35    Procedures Procedures (including critical care time) CRITICAL CARE Performed by: Fredia Sorrow Total critical care time: 60 minutes Critical care time was exclusive of separately billable procedures and treating other patients. Critical care was necessary to treat or prevent imminent or life-threatening deterioration. Critical care was time spent personally by me on the following activities: development of treatment plan with patient and/or surrogate as well as nursing, discussions with consultants, evaluation of patient's response to treatment, examination of patient, obtaining history from  patient or surrogate, ordering and performing treatments and interventions, ordering and review of laboratory studies, ordering and review of radiographic studies, pulse oximetry and re-evaluation of patient's condition.   Medications  Ordered in ED Medications  cefTRIAXone (ROCEPHIN) 1 g in sodium chloride 0.9 % 100 mL IVPB (has no administration in time range)  azithromycin (ZITHROMAX) 500 mg in sodium chloride 0.9 % 250 mL IVPB (has no administration in time range)  iohexol (OMNIPAQUE) 350 MG/ML injection 75 mL (75 mLs Intravenous Contrast Given 08/21/2020 1446)    ED Course  I have reviewed the triage vital signs and the nursing notes.  Pertinent labs & imaging results that were available during my care of the patient were reviewed by me and considered in my medical decision making (see chart for details).    MDM Rules/Calculators/A&P                           Patient arrived in respiratory distress.  Was on 100% nonrebreather.  Had briefly been on CPAP by EMS.  We put her back on BiPAP.  This eventually settled her down.  Oxygen sats in the mid 90s.  Patient feeling much more comfortable.  Patient was tachypneic and using a lot of accessory muscles to breathe.  On the BiPAP that has all resolved.  Chest x-ray raise some concerns for a right lower lobe pneumonia.  So patient had blood cultures and was started on antibiotics for pneumonia Rocephin and Zithromax.  Patient now looking much more comfortable.  Have requested respiratory to do arterial blood gas just to verify where she is at.  Based on the sudden hypoxia.  Did get CT angio chest.  Which showed no evidence of pulmonary embolus.  But did cloud the picture a little bit and there seems to be some bilateral infiltrates could be atypical pneumonia could be pulmonary edema.  And there is small pleural effusions.  Based on patient's history of aortic stenosis may require echocardiogram to further evaluate this.  Clearly patient will  require admission.  Have added BNP and troponins.  Does not appear that patient's been seen by cardiology recently.  But will contact them for consultation.  Patient's Covid testing negative.  Labs without significant abnormalities.  Discussed with cardiology who will evaluate for the history of aortic stenosis. Discussed with hospitalist who will admit. Patient remains comfortable on the BiPAP. Blood gas pending. BNP pending troponin pending   Final Clinical Impression(s) / ED Diagnoses Final diagnoses:  Acute respiratory failure with hypoxia ([place])  Community acquired pneumonia of right lower lobe of lung    Rx / DC Orders ED Discharge Orders    None       Fredia Sorrow, MD 08/22/2020 1601

## 2020-08-31 NOTE — Consult Note (Addendum)
Cardiology Consultation:   Julie Walter ID: Julie Walter MRN: [id number]; DOB: November 12, 1919  Admit date: 09/06/2020 Date of Consult: 08/25/2020  Primary Care Provider: Lauree Chandler, NP [facility] Cardiologist: [facility] Electrophysiologist:  None    Julie Walter Profile:   Julie Walter is a 86 y.o. female with a hx of Aortic stenosis, HTN, HLD, memory loss, chornic pain/fibromyalgia, anemia,  who is being seen today for the evaluation of pulmonary edema at the request of Dr. Roosevelt Locks.  History of Present Illness:   Julie Walter has been seen by cardiology in [place]. Julie Walter moved here 3 years ago. Julie Walter reported when Julie Walter was 77 they wanted to replace her aortic valve but Julie Walter refused because of her advanced age. Julie Walter takes medications for cholesterol and high blood pressure. Julie Walter denies history of CAD or prior ischemic work-up.   The Julie Walter was brought to the ED 08/18/2020 from an assisted living center for shortness of breath. Julie Walter initially had some shortness of breath on exertion Monday night. The had the flu shot that day and thought it was from that. This morning Julie Walter woke up and had progressive sob. Also had dizziness. Julie Walter developed chest pain. Left sided radiating to the shoulder and in the neck, sharp in nature, 8/10. Had never felt pain this bad before. Pain lasted an hour. No N/V or diaphoresis. Shortness of breath worsened and EMS was called.  Julie Walter was found to have 65% on RA. Julie Walter was given albuterol and solu-medrol, magnesium 2g. Started on CPAP and then switched to NR. Noted to be very tachypneic.   In the ED Julie Walter was placed on BIpap. BP 152/64, pulse 55, afebrile, rr 29. Labs showed creatinine 1.0, glucose 307, anion gap 17, normal LFTs, WBC 15.5, Hgb 16.0. pH 7.3, pCO2 48.3. Covid negative. CXR with small rt pleural effusions, possible pna. CTA chest showed no PE, small bilateral pleural effusions, possible pulmonary edema or atypical pna.   Julie Walter is short of breath on exam.  Denies chest pain.    Past Medical History:  Diagnosis Date  . Anemia   . Aortic stenosis   . Arthritis   . Breast cancer ([place])   . Carpal tunnel syndrome   . Chronic pain   . Colon cancer ([place])   . Fibromyalgia   . Hematuria   . History of hip replacement    bilateral  . Hyperglycemia   . Hyperlipidemia   . Hypertension   . Lumbago   . Memory loss   . Osteoarthritis   . Osteopenia     Past Surgical History:  Procedure Laterality Date  . ABDOMINAL HYSTERECTOMY  1958  . BREAST LUMPECTOMY  2008  . CHOLECYSTECTOMY  2002  . colon cancer tumor  1993   Dr. Jens Som  . hip replacements     2013 and 2014     Home Medications:  Prior to Admission medications   Medication Sig Start Date End Date Taking? Authorizing Provider  acetaminophen (TYLENOL) 500 MG tablet Take 500 mg by mouth as needed.    [provider]  cetirizine (ZYRTEC) 10 MG tablet Take 10 mg by mouth as needed for allergies.    [provider]  diclofenac sodium (VOLTAREN) 1 % GEL APPLY 2 GRAMS TO THE AFFECTED AREA DAILY. 01/26/19   Lauree Chandler, NP  hydrochlorothiazide (HYDRODIURIL) 25 MG tablet TAKE 1/2 TABLET BY MOUTH EVERY DAY Julie Walter taking differently: Take 12.5 mg by mouth daily.  08/05/20   Lauree Chandler, NP  isosorbide  mononitrate (IMDUR) 30 MG 24 hr tablet TAKE 1 TABLET BY MOUTH EVERY DAY Julie Walter taking differently: Take 30 mg by mouth daily.  05/09/20   Lauree Chandler, NP  Multiple Vitamin (MULTIVITAMIN WITH MINERALS) TABS tablet Take 1 tablet by mouth daily.    [provider]  polyethylene glycol (MIRALAX / GLYCOLAX) packet Take 17 g by mouth daily.     [provider]    Inpatient Medications: Scheduled Meds:  Continuous Infusions: . azithromycin    . cefTRIAXone (ROCEPHIN)  IV     PRN Meds:   Allergies:   No Known Allergies  Social History:   Social History   Socioeconomic History  . Marital status: Widowed    Spouse name: Not on file    . Number of children: Not on file  . Years of education: Not on file  . Highest education level: Not on file  Occupational History  . Not on file  Tobacco Use  . Smoking status: Former Smoker    Packs/day: 2.00    Years: 20.00    Pack years: 40.00    Quit date: 11/12/1986    Years since quitting: 33.8  . Smokeless tobacco: Never Used  . Tobacco comment: quit 25 years ago as of 2019   Vaping Use  . Vaping Use: Never used  Substance and Sexual Activity  . Alcohol use: Yes    Comment: 1 glass of wine every couple weeks  . Drug use: No  . Sexual activity: Not Currently  Other Topics Concern  . Not on file  Social History Narrative   Social History      Diet? light      Do you drink/eat things with caffeine? Coffee 1/day      Marital status?             widowed                       What year were you married? 1944      Do you live in a house, apartment, assisted living, condo, trailer, etc.? Independent living      Is it one or more stories? 3 stories      How many persons live in your home? 1      Do you have any pets in your home? (please list)  none      Highest level of education completed? High school + secretarial studies post      Current or past profession: Herbalist, Arts administrator      Do you exercise?                 yes                     Type & how often? Walking, back exercises      Advanced Directives      Do you have a living will? yes      Do you have a DNR form?       no                           If not, do you want to discuss one? yes      Do you have signed POA/HPOA for forms? yes      Functional Status      Do you have difficulty bathing or dressing yourself? no      Do  you have difficulty preparing food or eating? no      Do you have difficulty managing your medications? no      Do you have difficulty managing your finances? no      Do you have difficulty affording your medications? no   Social Determinants of Health    Financial Resource Strain:   . Difficulty of Paying Living Expenses: Not on file  Food Insecurity:   . Worried About Charity fundraiser in the Last Year: Not on file  . Ran Out of Food in the Last Year: Not on file  Transportation Needs:   . Lack of Transportation (Medical): Not on file  . Lack of Transportation (Non-Medical): Not on file  Physical Activity:   . Days of Exercise per Week: Not on file  . Minutes of Exercise per Session: Not on file  Stress:   . Feeling of Stress : Not on file  Social Connections:   . Frequency of Communication with Friends and Family: Not on file  . Frequency of Social Gatherings with Friends and Family: Not on file  . Attends Religious Services: Not on file  . Active Member of Clubs or Organizations: Not on file  . Attends Archivist Meetings: Not on file  . Marital Status: Not on file  Intimate Partner Violence:   . Fear of Current or Ex-Partner: Not on file  . Emotionally Abused: Not on file  . Physically Abused: Not on file  . Sexually Abused: Not on file    Family History:   Family History  Problem Relation Age of Onset  . Cancer Mother   . Heart disease Mother   . Cancer Sister   . Cancer Brother   . Cancer Sister   . Heart failure Brother   . Cancer Sister   . Heart failure Brother   . Hemachromatosis Son   . Diabetes Son      ROS:  Please see the history of present illness.  All other ROS reviewed and negative.     Physical Exam/Data:   Vitals:   08/27/2020 1152 08/30/2020 1155  BP: (!) 152/64   Pulse: (!) 55   Resp: (!) 29   Temp: 97.6 F (36.4 C)   TempSrc: Oral   SpO2: 95% 92%   No intake or output data in the 24 hours ending 08/16/2020 1621 Last 3 Weights 07/08/2020 03/11/2020 12/14/2019  Weight (lbs) 116 lb 12.8 oz 118 lb 3 oz 120 lb  Weight (kg) 52.98 kg 53.611 kg 54.432 kg     There is no height or weight on file to calculate BMI.  General:  Elderly female tachypneic HEENT: normal Lymph: no  adenopathy Neck: no JVD Endocrine:  No thryomegaly Vascular: No carotid bruits; FA pulses 2+ bilaterally without bruits  Cardiac:  normal S1, S2; RRR; + murmur  Lungs:diminished breath sounds Abd: soft, nontender, no hepatomegaly  Ext: no edema Musculoskeletal:  No deformities, BUE and BLE strength normal and equal Skin: warm and dry  Neuro:  CNs 2-12 intact, no focal abnormalities noted Psych:  Normal affect   EKG:  The EKG was personally reviewed and demonstrates:  NSR, PACs, LBBB new compared to prior EKF Telemetry:  Telemetry was personally reviewed and demonstrates:  NSR, PACs, PVCs  Relevant CV Studies:  Echo ordered  Laboratory Data:  High Sensitivity Troponin:  No results for input(s): TROPONINIHS in the last 720 hours.   Chemistry Recent Labs  Lab 09/09/2020 1206 08/30/2020 1557  NA 141 140  K 4.0 4.6  CL 103  --   CO2 21*  --   GLUCOSE 307*  --   BUN 18  --   CREATININE 1.00  --   CALCIUM 9.4  --   GFRNONAA 47*  --   ANIONGAP 17*  --     Recent Labs  Lab 08/14/2020 1206  PROT 7.1  ALBUMIN 3.9  AST 31  ALT 13  ALKPHOS 49  BILITOT 1.0   Hematology Recent Labs  Lab 08/22/2020 1206 08/30/2020 1557  WBC 15.5*  --   RBC 4.76  --   HGB 16.0* 16.0*  HCT 50.8* 47.0*  MCV 106.7*  --   MCH 33.6  --   MCHC 31.5  --   RDW 12.9  --   PLT 186  --    BNPNo results for input(s): BNP, PROBNP in the last 168 hours.  DDimer No results for input(s): DDIMER in the last 168 hours.   Radiology/Studies:  CT Angio Chest PE W/Cm &/Or Wo Cm  Result Date: 09/07/2020 CLINICAL DATA:  Shortness of breath EXAM: CT ANGIOGRAPHY CHEST WITH CONTRAST TECHNIQUE: Multidetector CT imaging of the chest was performed using the standard protocol during bolus administration of intravenous contrast. Multiplanar CT image reconstructions and MIPs were obtained to evaluate the vascular anatomy. CONTRAST:  64mL OMNIPAQUE IOHEXOL 350 MG/ML SOLN COMPARISON:  None. FINDINGS: Cardiovascular:  Satisfactory opacification of the pulmonary arteries to the segmental level. No evidence of pulmonary embolism. Normal heart size. No pericardial effusion. Coronary artery calcification. Thoracic aorta is normal in caliber with atherosclerotic calcification. Mediastinum/Nodes: No enlarged lymph nodes. Thyroid is unremarkable. Lungs/Pleura: Patchy bilateral ground-glass density and consolidation with basilar predominance. Small, right greater than left pleural effusions. No pneumothorax. Upper Abdomen: No acute abnormality Musculoskeletal: Multilevel degenerative changes of the included spine. No acute abnormality. Review of the MIP images confirms the above findings. IMPRESSION: No acute pulmonary embolism. Bilateral pulmonary opacities. Small bilateral pleural effusions. May reflect pulmonary edema or atypical/viral pneumonia. Electronically Signed   By: Macy Mis M.D.   On: 09/07/2020 14:58   DG Chest Port 1 View  Result Date: 08/18/2020 CLINICAL DATA:  Shortness of breath EXAM: PORTABLE CHEST 1 VIEW COMPARISON:  None. FINDINGS: Heart size is upper limits of normal. Atherosclerotic calcification of the aortic knob. Dense right basilar consolidation. Small right pleural effusion. Prominent interstitial markings throughout both lungs. No pneumothorax. Advanced degenerative changes of the bilateral glenohumeral joints. IMPRESSION: Dense right basilar consolidation with small right pleural effusion. Findings may represent a combination of atelectasis and pneumonia. Radiographic follow-up to resolution is recommended. Electronically Signed   By: Davina Poke D.O.   On: 09/01/2020 12:35     Assessment and Plan:   Shortness of breath/pulmonary edema - CTA chest with no PA, bilateral pleural effusions, possible edema vs atypical infection - covid negative - on Bipap and still using accessory muscles - diminished breath sounds on exam - Will order echo - EKG with NSR, PACs and new LBBB since EKG  in 2019. Also Julie Walter had chest pain. Troponin obtained - IV lasix 20mg  ordered - Suspect symptoms from AS  Leukocytosis - BC obtained - Empiric Abx per IM  Aortic stenosis - reported Julie Walter previously declined AV replacement procedure (TAVR) at [facility] 3 years ago   Julie Walter does not want anything done now  " I am Done" - will order echo  LBBB - New since EKG in 2019 - Julie Walter reported chest pain this  morning. Julie Walter is currently  Without chest pain  - Hs troponin pending  HTN - PTA Imdur, HCTZ>>will hold per Dr. Harrington Challenger   HLD - PTA not on meds - check FLP  For questions or updates, please contact [facility] Please consult [email or web address] for contact info under    Signed, Cadence Ninfa Meeker, PA-C  09/05/2020 4:21 PM   Julie Walter seen and examined   I agree with findings as noted by C Furth above  Julie Walter is a 86 yo with hx reported AS   Julie Walter declined TAVR 3 years ago Presents with severe SOB and pulmonary edema On exam, Julie Walter says her breathing is a little better from when Julie Walter arrived to ED  Still short  Neck:  JVP increased Lungs with rales bilaterally 1/2 up Cardiac exam:  Distant  RRR Abd is supple  Ext Feet cool  No edema    Pulse is tardus  Julie Walter with probable severe/critical AS with pulmonary edema Julie Walter does not want anything heroic    " I am done" Would recmmm diuresing   HOld on ACE I and imdur for now   Do not want to make hypotensive   Follow BP tonight I have reviewed above with daughter   Julie Walter may have records from [place] I would recomm a Palliative Care Consult tomorrow to help with comfort care after d/c.  Dorris Carnes MD

## 2020-08-31 NOTE — ED Triage Notes (Signed)
Pt arrived via EMS from [facility]. EMS reports pt having a sudden onset of sob since 0700 today. Pt was 65% on room air on EMS arrival. EMS reports pt being fully vaccinated against Covid and received her flu shot on Monday. Pt was given albuterol 5mg , solumedrol 125mg , and magnesium 2g by EMS. Pt was on a non-rebreather at 15L on ED arrival. Respiratory at bedside now placing pt on bipap.

## 2020-08-31 NOTE — Progress Notes (Signed)
RT transported pt from ED to [facility]. RT of that floor made aware.

## 2020-08-31 NOTE — ED Notes (Signed)
External urinary cath placed on pt.

## 2020-09-01 ENCOUNTER — Inpatient Hospital Stay (HOSPITAL_COMMUNITY): Payer: Medicare HMO

## 2020-09-01 DIAGNOSIS — I350 Nonrheumatic aortic (valve) stenosis: Secondary | ICD-10-CM

## 2020-09-01 DIAGNOSIS — I340 Nonrheumatic mitral (valve) insufficiency: Secondary | ICD-10-CM

## 2020-09-01 DIAGNOSIS — J9601 Acute respiratory failure with hypoxia: Secondary | ICD-10-CM | POA: Diagnosis not present

## 2020-09-01 DIAGNOSIS — I5021 Acute systolic (congestive) heart failure: Secondary | ICD-10-CM | POA: Diagnosis not present

## 2020-09-01 DIAGNOSIS — Z515 Encounter for palliative care: Secondary | ICD-10-CM

## 2020-09-01 DIAGNOSIS — I361 Nonrheumatic tricuspid (valve) insufficiency: Secondary | ICD-10-CM

## 2020-09-01 DIAGNOSIS — E119 Type 2 diabetes mellitus without complications: Secondary | ICD-10-CM

## 2020-09-01 DIAGNOSIS — J810 Acute pulmonary edema: Secondary | ICD-10-CM | POA: Diagnosis not present

## 2020-09-01 LAB — ECHOCARDIOGRAM COMPLETE
AR max vel: 0.35 cm2
AV Area VTI: 0.28 cm2
AV Area mean vel: 0.33 cm2
AV Mean grad: 69 mmHg
AV Peak grad: 94.7 mmHg
Ao pk vel: 4.87 m/s
Calc EF: 45.1 %
Height: 58 in
MV M vel: 6.09 m/s
MV Peak grad: 148.4 mmHg
Radius: 0.4 cm
S' Lateral: 3.3 cm
Single Plane A2C EF: 44.6 %
Single Plane A4C EF: 45 %
Weight: 1961.21 oz

## 2020-09-01 LAB — BASIC METABOLIC PANEL
Anion gap: 14 (ref 5–15)
BUN: 26 mg/dL — ABNORMAL HIGH (ref 8–23)
CO2: 22 mmol/L (ref 22–32)
Calcium: 9.1 mg/dL (ref 8.9–10.3)
Chloride: 106 mmol/L (ref 98–111)
Creatinine, Ser: 0.9 mg/dL (ref 0.44–1.00)
GFR, Estimated: 53 mL/min — ABNORMAL LOW (ref >60)
Glucose, Bld: 160 mg/dL — ABNORMAL HIGH (ref 70–99)
Potassium: 4.6 mmol/L (ref 3.5–5.1)
Sodium: 142 mmol/L (ref 135–145)

## 2020-09-01 LAB — MRSA PCR SCREENING: MRSA by PCR: NEGATIVE

## 2020-09-01 LAB — TROPONIN I (HIGH SENSITIVITY): Troponin I (High Sensitivity): 4682 ng/L (ref <18)

## 2020-09-01 MED ORDER — MORPHINE SULFATE (PF) 2 MG/ML IV SOLN
2.0000 mg | Freq: Once | INTRAVENOUS | Status: AC
  Administered 2020-09-01: 2 mg via INTRAVENOUS
  Filled 2020-09-01: qty 1

## 2020-09-01 MED ORDER — FUROSEMIDE 10 MG/ML IJ SOLN: 80.0000 mg | Freq: Once | INTRAMUSCULAR | Status: DC

## 2020-09-01 MED ORDER — MORPHINE SULFATE (PF) 2 MG/ML IV SOLN: 1.0000 mg | INTRAVENOUS | Status: DC | PRN

## 2020-09-01 MED ORDER — FUROSEMIDE 10 MG/ML IJ SOLN
20.0000 mg | Freq: Three times a day (TID) | INTRAMUSCULAR | Status: DC
  Administered 2020-09-01: 20 mg via INTRAVENOUS
  Filled 2020-09-01: qty 2

## 2020-09-01 MED ORDER — METOPROLOL TARTRATE 5 MG/5ML IV SOLN
2.5000 mg | INTRAVENOUS | Status: DC | PRN
  Administered 2020-09-01 (×2): 2.5 mg via INTRAVENOUS
  Filled 2020-09-01 (×2): qty 5

## 2020-09-01 MED ORDER — FUROSEMIDE 10 MG/ML IJ SOLN
INTRAMUSCULAR | Status: AC
  Filled 2020-09-01: qty 8

## 2020-09-01 MED ORDER — MORPHINE SULFATE (PF) 2 MG/ML IV SOLN
2.0000 mg | INTRAVENOUS | Status: DC | PRN
  Administered 2020-09-01 (×2): 2 mg via INTRAVENOUS
  Filled 2020-09-01 (×2): qty 1

## 2020-09-01 NOTE — Progress Notes (Signed)
PT Cancellation Note  Patient Details Name: Julie Walter MRN: [id number] DOB: [birth year]   Cancelled Treatment:    Reason Eval/Treat Not Completed: Other (comment) Pt sleeping on BiPAP. Will follow up at a later time.  Wyona Almas, PT, DPT Acute Rehabilitation Services Pager [phone] Office [phone]    Deno Etienne 09/01/2020, 2:30 PM

## 2020-09-01 NOTE — Progress Notes (Addendum)
[facility] PROGRESS NOTE    Progress Note  Julie Walter  TDV:[id number] DOB: Apr 22, 1919 DOA: 08/30/2020 PCP: Lauree Chandler, NP     Brief Narrative:   Julie Walter is an 86 y.o. female past medical history of severe aortic stenosis essential hypertension presented with hypoxia and dizziness she relate that she was evaluated about 3 years prior to admission for a TAVR's at [place] which she refused due to her advanced age she was lost to follow-up when she arrived to [facility] she relates her to barely walk 5 minutes she feels lightheadedness and fatigue.  With nursing home she called staff he found that her saturations were 65%, sent to the ED placed on BiPAP giving magnesium Solu-Medrol and albuterol in the ED.  CT angio for PE was negative  Assessment/Plan:   Acute respiratory failure with hypoxia 2/2 Severe aortic stenosis: She has a history of underlying aortic stenosis which is severe.  She likely has systolic heart failure. Cardiology was consulted a 2D echo was ordered which is pending. CT angio of the chest showed small bilateral pleural effusions and bilateral infiltrates with a BNP greater than 2000 On admission she appeared fluid overloaded she was started on IV Lasix. Mentally she is very cleared and she would like to move towards comfort care. She appears fluid overloaded on physical exam positive JVD and bilateral crackles. With minimal diuresis discontinue all her antihypertensive medication, increase her Lasix, try to wean her  off the BiPAP, use morphine for pain or shortness of breath schedule monitor strict I's and O's. Discontinue cardiac monitor. She is not septic, she was not septic on admission.  New left bundle branch block/elevated troponins/chest pain Significantly elevated her cardiac biomarkers, with a new left bundle branch block compared to EKG from 2019.  She relates no chest pain at the time of my interview. I believe these changes are  due to her severe aortic stenosis. Cardiology has been consulted.  Mild leukocytosis: Likely reactive, she has remained afebrile discontinue all antibiotics.  Essential hypertension: Discontinue all antihypertensive medication.  Type 2 diabetes mellitus without complication, without long-term current use of insulin (HCC) Blood glucose improved from 300-1 60 likely reactive. She would like to be made comfortable I will go ahead and try to improve her breathing and try to get her off BiPAP to high flow or nonrebreather.  Goals of care/end-of-life: The patient is very clear on what she wants she does not want any further aggressive intervention, she would like to move towards comfort care) her last few days at home.  She wants to be taken off the BiPAP machine and try to get home to spend her last few days to weeks with her family at her house or at her daughter's house.  She is going to talk to the daughter to try to make that decision.  In the meantime we will try to diurese her, use morphine for shortness of breath and try to get her off the BiPAP to a nonrebreather or nasal cannula.    DVT prophylaxis: lovenox Family Communication:Daughter Status is: Inpatient  Remains inpatient appropriate because:Hemodynamically unstable   Dispo: The patient is from: ALF              Anticipated d/c is to: ALF              Anticipated d/c date is: > 3 days              Patient currently is not medically stable  to d/c.        Code Status:     Code Status Orders  (From admission, onward)         Start     Ordered   08/22/2020 1623  Do not attempt resuscitation (DNR)  Continuous       Question Answer Comment  In the event of cardiac or respiratory ARREST Do not call a "code blue"   In the event of cardiac or respiratory ARREST Do not perform Intubation, CPR, defibrillation or ACLS   In the event of cardiac or respiratory ARREST Use medication by any route, position, wound care, and  other measures to relive pain and suffering. May use oxygen, suction and manual treatment of airway obstruction as needed for comfort.      09/04/2020 1624        Code Status History    Date Active Date Inactive Code Status Order ID Comments User Context   06/23/2020 1550 08/14/2020 1150 DNR [id number]  Lauree Chandler, NP Outpatient   Advance Care Planning Activity        IV Access:    Peripheral IV   Procedures and diagnostic studies:   DG Chest 1 View  Result Date: 09/01/2020 CLINICAL DATA:  CHF. EXAM: CHEST  1 VIEW COMPARISON:  CT 08/19/2020.  Chest x-ray 08/17/2020. FINDINGS: Mediastinum stable. Heart size stable. Diffuse bilateral interstitial prominence and bibasilar atelectasis/infiltrates again noted. Tiny bilateral pleural effusions can not be excluded. No pneumothorax. Stable deformity right proximal humerus. Degenerative change both shoulders and thoracic spine. Surgical clips right upper quadrant. IMPRESSION: Diffuse bilateral interstitial prominence again noted. Interstitial edema and/or pneumonitis could present this fashion. Bibasilar atelectasis/infiltrates again noted. Tiny bilateral pleural effusions can not be excluded. Electronically Signed   By: Marcello Moores  Register   On: 09/01/2020 05:57   CT Angio Chest PE W/Cm &/Or Wo Cm  Result Date: 08/23/2020 CLINICAL DATA:  Shortness of breath EXAM: CT ANGIOGRAPHY CHEST WITH CONTRAST TECHNIQUE: Multidetector CT imaging of the chest was performed using the standard protocol during bolus administration of intravenous contrast. Multiplanar CT image reconstructions and MIPs were obtained to evaluate the vascular anatomy. CONTRAST:  93mL OMNIPAQUE IOHEXOL 350 MG/ML SOLN COMPARISON:  None. FINDINGS: Cardiovascular: Satisfactory opacification of the pulmonary arteries to the segmental level. No evidence of pulmonary embolism. Normal heart size. No pericardial effusion. Coronary artery calcification. Thoracic aorta is normal in caliber  with atherosclerotic calcification. Mediastinum/Nodes: No enlarged lymph nodes. Thyroid is unremarkable. Lungs/Pleura: Patchy bilateral ground-glass density and consolidation with basilar predominance. Small, right greater than left pleural effusions. No pneumothorax. Upper Abdomen: No acute abnormality Musculoskeletal: Multilevel degenerative changes of the included spine. No acute abnormality. Review of the MIP images confirms the above findings. IMPRESSION: No acute pulmonary embolism. Bilateral pulmonary opacities. Small bilateral pleural effusions. May reflect pulmonary edema or atypical/viral pneumonia. Electronically Signed   By: Macy Mis M.D.   On: 08/15/2020 14:58   DG Chest Port 1 View  Result Date: 08/22/2020 CLINICAL DATA:  Shortness of breath EXAM: PORTABLE CHEST 1 VIEW COMPARISON:  None. FINDINGS: Heart size is upper limits of normal. Atherosclerotic calcification of the aortic knob. Dense right basilar consolidation. Small right pleural effusion. Prominent interstitial markings throughout both lungs. No pneumothorax. Advanced degenerative changes of the bilateral glenohumeral joints. IMPRESSION: Dense right basilar consolidation with small right pleural effusion. Findings may represent a combination of atelectasis and pneumonia. Radiographic follow-up to resolution is recommended. Electronically Signed   By: Davina Poke D.O.  On: 09/06/2020 12:35     Medical Consultants:    None.  Anti-Infectives:   none  Subjective:    Julie Walter she relates her breathing is better she which the BiPAP to be taken off whenever possible.  She also relates some mild chest pain.  Objective:    Vitals:   09/02/2020 2306 09/01/20 0301 09/01/20 0349 09/01/20 0600  BP:   115/75   Pulse: 83 89 76   Resp: (!) 23 (!) 25 (!) 22   Temp:   98.3 F (36.8 C)   TempSrc:   Oral   SpO2: 95% 97% 95% 97%  Weight:      Height:       SpO2: 97 % O2 Flow Rate (L/min): 5 L/min FiO2 (%):  50 %   Intake/Output Summary (Last 24 hours) at 09/01/2020 0706 Last data filed at 09/01/2020 0400 Gross per 24 hour  Intake 50 ml  Output --  Net 50 ml   Filed Weights   08/28/2020 1800 09/09/2020 2030  Weight: 52.6 kg 55.6 kg    Exam: General exam: In no acute distress. Respiratory system: Good air movement and bilateral diffuse crackles Cardiovascular system: S1 & S2 heard, RRR.  Positive JVD Gastrointestinal system: Abdomen is nondistended, soft and nontender.  Extremities: No pedal edema. Skin: No rashes, lesions or ulcers Psychiatry: Judgement and insight appear normal. Mood & affect appropriate.    Data Reviewed:    Labs: Basic Metabolic Panel: Recent Labs  Lab 08/29/2020 1206 08/16/2020 1206 09/02/2020 1557 09/01/20 0307  NA 141  --  140 142  K 4.0   < > 4.6 4.6  CL 103  --   --  106  CO2 21*  --   --  22  GLUCOSE 307*  --   --  160*  BUN 18  --   --  26*  CREATININE 1.00  --   --  0.90  CALCIUM 9.4  --   --  9.1   < > = values in this interval not displayed.   GFR Estimated Creatinine Clearance: 25.2 mL/min (by C-G formula based on SCr of 0.9 mg/dL). Liver Function Tests: Recent Labs  Lab 08/19/2020 1206  AST 31  ALT 13  ALKPHOS 49  BILITOT 1.0  PROT 7.1  ALBUMIN 3.9   No results for input(s): LIPASE, AMYLASE in the last 168 hours. No results for input(s): AMMONIA in the last 168 hours. Coagulation profile No results for input(s): INR, PROTIME in the last 168 hours. COVID-19 Labs  No results for input(s): DDIMER, FERRITIN, LDH, CRP in the last 72 hours.  Lab Results  Component Value Date   [place] NEGATIVE 08/29/2020    CBC: Recent Labs  Lab 08/30/2020 1206 08/16/2020 1557  WBC 15.5*  --   NEUTROABS 9.6*  --   HGB 16.0* 16.0*  HCT 50.8* 47.0*  MCV 106.7*  --   PLT 186  --    Cardiac Enzymes: No results for input(s): CKTOTAL, CKMB, CKMBINDEX, TROPONINI in the last 168 hours. BNP (last 3 results) No results for input(s): PROBNP in the  last 8760 hours. CBG: No results for input(s): GLUCAP in the last 168 hours. D-Dimer: No results for input(s): DDIMER in the last 72 hours. Hgb A1c: No results for input(s): HGBA1C in the last 72 hours. Lipid Profile: No results for input(s): CHOL, HDL, LDLCALC, TRIG, CHOLHDL, LDLDIRECT in the last 72 hours. Thyroid function studies: No results for input(s): TSH, T4TOTAL, T3FREE, THYROIDAB in the  last 72 hours.  Invalid input(s): FREET3 Anemia work up: No results for input(s): VITAMINB12, FOLATE, FERRITIN, TIBC, IRON, RETICCTPCT in the last 72 hours. Sepsis Labs: Recent Labs  Lab 08/15/2020 1206 09/01/2020 2042  PROCALCITON  --  1.07  WBC 15.5*  --   LATICACIDVEN  --  2.2*   Microbiology Recent Results (from the past 240 hour(s))  Resp Panel by RT PCR (RSV, Flu A&B, Covid) - Nasopharyngeal Swab     Status: None   Collection Time: 09/02/2020 12:09 PM   Specimen: Nasopharyngeal Swab  Result Value Ref Range Status   SARS Coronavirus 2 by RT PCR NEGATIVE NEGATIVE Final    Comment: (NOTE) SARS-CoV-2 target nucleic acids are NOT DETECTED.  The SARS-CoV-2 RNA is generally detectable in upper respiratoy specimens during the acute phase of infection. The lowest concentration of SARS-CoV-2 viral copies this assay can detect is 131 copies/mL. A negative result does not preclude SARS-Cov-2 infection and should not be used as the sole basis for treatment or other patient management decisions. A negative result may occur with  improper specimen collection/handling, submission of specimen other than nasopharyngeal swab, presence of viral mutation(s) within the areas targeted by this assay, and inadequate number of viral copies (<131 copies/mL). A negative result must be combined with clinical observations, patient history, and epidemiological information. The expected result is Negative.  Fact Sheet for Patients:  PinkCheek.be  Fact Sheet for Healthcare  Providers:  GravelBags.it  This test is no t yet approved or cleared by the [place] FDA and  has been authorized for detection and/or diagnosis of SARS-CoV-2 by FDA under an Emergency Use Authorization (EUA). This EUA will remain  in effect (meaning this test can be used) for the duration of the COVID-19 declaration under Section 564(b)(1) of the Act, 21 U.S.C. section 360bbb-3(b)(1), unless the authorization is terminated or revoked sooner.     Influenza A by PCR NEGATIVE NEGATIVE Final   Influenza B by PCR NEGATIVE NEGATIVE Final    Comment: (NOTE) The Xpert Xpress SARS-CoV-2/FLU/RSV assay is intended as an aid in  the diagnosis of influenza from Nasopharyngeal swab specimens and  should not be used as a sole basis for treatment. Nasal washings and  aspirates are unacceptable for Xpert Xpress SARS-CoV-2/FLU/RSV  testing.  Fact Sheet for Patients: PinkCheek.be  Fact Sheet for Healthcare Providers: GravelBags.it  This test is not yet approved or cleared by the [place] FDA and  has been authorized for detection and/or diagnosis of SARS-CoV-2 by  FDA under an Emergency Use Authorization (EUA). This EUA will remain  in effect (meaning this test can be used) for the duration of the  Covid-19 declaration under Section 564(b)(1) of the Act, 21  U.S.C. section 360bbb-3(b)(1), unless the authorization is  terminated or revoked.    Respiratory Syncytial Virus by PCR NEGATIVE NEGATIVE Final    Comment: (NOTE) Fact Sheet for Patients: PinkCheek.be  Fact Sheet for Healthcare Providers: GravelBags.it  This test is not yet approved or cleared by the [place] FDA and  has been authorized for detection and/or diagnosis of SARS-CoV-2 by  FDA under an Emergency Use Authorization (EUA). This EUA will remain  in effect (meaning this  test can be used) for the duration of the  COVID-19 declaration under Section 564(b)(1) of the Act, 21 U.S.C.  section 360bbb-3(b)(1), unless the authorization is terminated or  revoked. Performed at [facility], [place] [address], [place], [place] [address]      Medications:   .  diclofenac Sodium  2 g Topical QID  . enoxaparin (LOVENOX) injection  30 mg Subcutaneous Q24H  . furosemide  20 mg Intravenous Once  . ipratropium-albuterol  3 mL Nebulization Q6H  . loratadine  10 mg Oral Daily  . multivitamin with minerals  1 tablet Oral Daily  . polyethylene glycol  17 g Oral Daily  . sodium chloride flush  3 mL Intravenous Q12H   Continuous Infusions: . sodium chloride    . azithromycin    . cefTRIAXone (ROCEPHIN)  IV        LOS: 1 day   Charlynne Cousins  [facility]  09/01/2020, 7:06 AM

## 2020-09-01 NOTE — Progress Notes (Signed)
Pt placed back on BIPAP. Pt c/o SOB & pain w/ inhalation. After monitoring pt at bedside for a few minutes pt reported relief and stated she will keep BIPAP for a while longer. Bed alarm set. Call light in reach. Will con't to monitor.

## 2020-09-01 NOTE — Progress Notes (Signed)
   09/01/20 0823  Assess: MEWS Score  BP 122/75  O2 Device HFNC  O2 Flow Rate (L/min) 10 L/min  Assess: MEWS Score  MEWS Temp 0  MEWS Systolic 0  MEWS Pulse 1  MEWS RR 2  MEWS LOC 0  MEWS Score 3  MEWS Score Color Yellow  Assess: if the MEWS score is Yellow or Red  Were vital signs taken at a resting state? Yes  Focused Assessment No change from prior assessment  Early Detection of Sepsis Score *See Row Information* Low  MEWS guidelines implemented *See Row Information* No, other (Comment)  Treat  MEWS Interventions  (Patient downgraded from bipap to hiflow, sats 99%. )  Pain Scale 0-10  Pain Score 0  Take Vital Signs  Increase Vital Sign Frequency  Yellow: Q 2hr X 2 then Q 4hr X 2, if remains yellow, continue Q 4hrs (RT taking vitals q2hrs)  Notify: Charge Nurse/RN  Name of Charge Nurse/RN Notified Eun, RN  Date Charge Nurse/RN Notified 09/01/20  Notify: Provider  Provider Name/Title MD Aware  Date Provider Notified 09/01/20  Time Provider Notified 0800  Notification Type Face-to-face  Notification Reason Other (Comment) (Downgrade to hiflow)  Response See new orders  Date of Provider Response 09/01/20  Time of Provider Response 0800  Notify: Rapid Response  Name of Rapid Response RN Notified no  Document  Patient Outcome Stabilized after interventions  Progress note created (see row info) Yes

## 2020-09-01 NOTE — Progress Notes (Signed)
New order received for PRN metoprolol to keep HR below 90. First dose administered after educating patient on purpose, HR 100 at time of administration.

## 2020-09-01 NOTE — Progress Notes (Signed)
Patient remains on BiPAP and sleeping. RT to monitor

## 2020-09-01 NOTE — Progress Notes (Signed)
Patient wished to sit on the side of her bed and attempt to eat dinner. Removed BiPap and placed on HFNC at 10L. Patient is doing well currently, stated she feels much better, rested. HR in the mid 80s to mid 90s- no additional metoprolol needed at this time. O2 sats remain in the mid to upper 90s on HFNC. Resp rate is currently in the low to mid 20s, no distress noted.

## 2020-09-01 NOTE — Progress Notes (Signed)
Patient taken off BIPAP and placed on 4L [place]. Vital signs currently stable, patient tolerating well. RN informed, RT will continue to monitor patient for BIPAP need.

## 2020-09-01 NOTE — Progress Notes (Signed)
Patient taken off BiPAP and placed on [place] HFNC at 10L. Stated she feels much better on that than NRB. Seems to be tolerating well. NO SOB noted

## 2020-09-01 NOTE — Progress Notes (Signed)
RT placed patient back on BiPAP per her request. Patient was extremely tired and stated she needed to rest on the BiPAP. RT to monitor.

## 2020-09-01 NOTE — Progress Notes (Signed)
Manufacturing engineer [facility]) Hospital Liaison: RN note     Notified by Transition of [place], CSW of patient/family request for [facility] services at home after discharge. Chart and patient information under review by [facility] physician. Hospice eligibility pending currently.     Writer spoke with daughter, Anne Ng to initiate education related to hospice philosophy, services and team approach to care. Anne Ng verbalized understanding of information given. Please send signed and completed DNR form home with patient/family. Patient will need prescriptions for discharge comfort medications.      DME needs have been discussed, patient currently has the following equipment in the home:  walker.  Patient/family requests the following DME for delivery to the home:  Oxygen and 3N1. [place] equipment manager has been notified and will contact DME provider to arrange delivery to the home. Home address is with daughter, [address] [place], [address].  Anne Ng  is the family member to contact to arrange time of delivery.      [facility] aware of the above. Please notify [facility] when patient is ready to leave the unit at discharge. (Call [phone] or [phone] after 5pm.) [facility] information and contact numbers given to  [facility].       Please call with any hospice related questions.      Thank you for this referral.     Farrel Gordon, RN, [facility] (listed on [facility] under [place])   [phone]

## 2020-09-01 NOTE — Progress Notes (Signed)
Echocardiogram 2D Echocardiogram has been performed.  Oneal Deputy Deborra Phegley 09/01/2020, 11:19 AM

## 2020-09-01 NOTE — Progress Notes (Addendum)
Progress Note  Patient Name: Julie Walter Date of Encounter: 09/01/2020  Primary Cardiologist:  Dorris Carnes, MD  Subjective   Breathing better, off BiPAP, on high-flow [place] Had CP this am, when she was more SOB, got 2 mg MSO4 and more O2, sx resolved  Inpatient Medications    Scheduled Meds: . diclofenac Sodium  2 g Topical QID  . enoxaparin (LOVENOX) injection  30 mg Subcutaneous Q24H  . furosemide  20 mg Intravenous Q8H  . loratadine  10 mg Oral Daily  . polyethylene glycol  17 g Oral Daily  . sodium chloride flush  3 mL Intravenous Q12H   Continuous Infusions: . sodium chloride     PRN Meds: sodium chloride, acetaminophen, morphine injection, ondansetron (ZOFRAN) IV, sodium chloride flush   Vital Signs    Vitals:   09/01/20 0740 09/01/20 0800 09/01/20 0823 09/01/20 0900  BP: 119/67 100/63 122/75   Pulse: (!) 110 (!) 115  85  Resp: (!) 30 (!) 34  (!) 24  Temp: 98.3 F (36.8 C)     TempSrc: Oral     SpO2: 99% 99%    Weight:      Height:        Intake/Output Summary (Last 24 hours) at 09/01/2020 1005 Last data filed at 09/01/2020 0400 Gross per 24 hour  Intake 50 ml  Output --  Net 50 ml   Filed Weights   08/30/2020 1800 08/27/2020 2030  Weight: 52.6 kg 55.6 kg   Last Weight  Most recent update: 08/12/2020  8:37 PM   Weight  55.6 kg (122 lb 9.2 oz)           Weight change:    Telemetry    SR, PACs are frequent, some pairs, some bigemine - Personally Reviewed  ECG    None today - Personally Reviewed  Physical Exam   General: Well developed, well nourished, female appearing in no acute distress. Head: Normocephalic, atraumatic.  Neck: Supple without bruits, JVD 10 cm. Lungs:  Resp regular and unlabored, bilateral rales. Heart: RRR, S1, S2, no S3, S4, 3/6 murmur; no rub. Abdomen: Soft, non-tender, non-distended with normoactive bowel sounds. No hepatomegaly. No rebound/guarding. No obvious abdominal masses. Extremities: No clubbing,  cyanosis, no LE edema. Distal pedal pulses are 2+ bilaterally. Neuro: Alert and oriented X 3. Moves all extremities spontaneously. Psych: Normal affect.  Labs    Hematology Recent Labs  Lab 08/19/2020 1206 08/30/2020 1557  WBC 15.5*  --   RBC 4.76  --   HGB 16.0* 16.0*  HCT 50.8* 47.0*  MCV 106.7*  --   MCH 33.6  --   MCHC 31.5  --   RDW 12.9  --   PLT 186  --     Chemistry Recent Labs  Lab 08/18/2020 1206 08/18/2020 1557 09/01/20 0307  NA 141 140 142  K 4.0 4.6 4.6  CL 103  --  106  CO2 21*  --  22  GLUCOSE 307*  --  160*  BUN 18  --  26*  CREATININE 1.00  --  0.90  CALCIUM 9.4  --  9.1  PROT 7.1  --   --   ALBUMIN 3.9  --   --   AST 31  --   --   ALT 13  --   --   ALKPHOS 49  --   --   BILITOT 1.0  --   --   GFRNONAA 47*  --  53*  ANIONGAP 17*  --  14     High Sensitivity Troponin:   Recent Labs  Lab 09/11/2020 2042 09/01/20 0307  TROPONINIHS 2,897* 4,682*      BNP Recent Labs  Lab 08/29/2020 2042  BNP 2,794.6*     DDimer No results for input(s): DDIMER in the last 168 hours.   Radiology    DG Chest 1 View  Result Date: 09/01/2020 CLINICAL DATA:  CHF. EXAM: CHEST  1 VIEW COMPARISON:  CT 08/25/2020.  Chest x-ray 08/23/2020. FINDINGS: Mediastinum stable. Heart size stable. Diffuse bilateral interstitial prominence and bibasilar atelectasis/infiltrates again noted. Tiny bilateral pleural effusions can not be excluded. No pneumothorax. Stable deformity right proximal humerus. Degenerative change both shoulders and thoracic spine. Surgical clips right upper quadrant. IMPRESSION: Diffuse bilateral interstitial prominence again noted. Interstitial edema and/or pneumonitis could present this fashion. Bibasilar atelectasis/infiltrates again noted. Tiny bilateral pleural effusions can not be excluded. Electronically Signed   By: Marcello Moores  Register   On: 09/01/2020 05:57   CT Angio Chest PE W/Cm &/Or Wo Cm  Result Date: 08/28/2020 CLINICAL DATA:  Shortness of breath  EXAM: CT ANGIOGRAPHY CHEST WITH CONTRAST TECHNIQUE: Multidetector CT imaging of the chest was performed using the standard protocol during bolus administration of intravenous contrast. Multiplanar CT image reconstructions and MIPs were obtained to evaluate the vascular anatomy. CONTRAST:  39mL OMNIPAQUE IOHEXOL 350 MG/ML SOLN COMPARISON:  None. FINDINGS: Cardiovascular: Satisfactory opacification of the pulmonary arteries to the segmental level. No evidence of pulmonary embolism. Normal heart size. No pericardial effusion. Coronary artery calcification. Thoracic aorta is normal in caliber with atherosclerotic calcification. Mediastinum/Nodes: No enlarged lymph nodes. Thyroid is unremarkable. Lungs/Pleura: Patchy bilateral ground-glass density and consolidation with basilar predominance. Small, right greater than left pleural effusions. No pneumothorax. Upper Abdomen: No acute abnormality Musculoskeletal: Multilevel degenerative changes of the included spine. No acute abnormality. Review of the MIP images confirms the above findings. IMPRESSION: No acute pulmonary embolism. Bilateral pulmonary opacities. Small bilateral pleural effusions. May reflect pulmonary edema or atypical/viral pneumonia. Electronically Signed   By: Macy Mis M.D.   On: 08/17/2020 14:58   DG Chest Port 1 View  Result Date: 08/27/2020 CLINICAL DATA:  Shortness of breath EXAM: PORTABLE CHEST 1 VIEW COMPARISON:  None. FINDINGS: Heart size is upper limits of normal. Atherosclerotic calcification of the aortic knob. Dense right basilar consolidation. Small right pleural effusion. Prominent interstitial markings throughout both lungs. No pneumothorax. Advanced degenerative changes of the bilateral glenohumeral joints. IMPRESSION: Dense right basilar consolidation with small right pleural effusion. Findings may represent a combination of atelectasis and pneumonia. Radiographic follow-up to resolution is recommended. Electronically Signed    By: Davina Poke D.O.   On: 08/21/2020 12:35     Cardiac Studies   ECHO:  ordered  Patient Profile     86 y.o. female w/ hx Severe Aortic stenosis, HTN, HLD, memory loss, chornic pain/fibromyalgia, anemia, who was admitted 10/20 with presyncope, CP, SOB, and hypoxia.   Assessment & Plan    1.Aortic stenosis  Hx of severe AS  Pt turned down TAVR 3 years ago - echo ordered   2. CHF  - s/p IV Lasix 40 mg x 1, now on 20 mg IV q 8 h   - no UOP recorded  Does not appear tobe responding  - need daily wts - follow I/O, wts and BMET qd  3. Elevated troponin - had CP this am, resolved w/ rx - f/u on echo -  Otherwise, per IM Active Problems:   Severe aortic stenosis  Type 2 diabetes mellitus without complication, without long-term current use of insulin (HCC)   Acute systolic CHF (congestive heart failure) (HCC)   Acute respiratory failure with hypoxia (HCC)   End of life care    Signed, Rosaria Ferries , PA-C 10:05 AM 09/01/2020 Pager: [phone]  Pt seen and examined   I have reviewed note above by R Barrett  Pt remains very uncomfortable   SOB    "Is this as good as its going to get?"    BiPAP helped  On exam: JVP increased Lungs   Rales and wheezes bilaterally Cardiac  Distant HS Ext  No edema  Trop rising  Peak so far 4682  I have reviewed echo   LVEF is severely decreased  With severe hypokinesis of the anterior, anterolateral walls    AV is critically narrowed  Mean gradient 62 mm     Overall picture consistent with end stage aortic stenosis.  She is not having chest pain which goes against primary ischemia an resultant LV dysfunction  EKG is nondiagnostic with LBBB   May be AS and CAD   She has not diuresed   Still with pulmonary edeam I would give 80 mg lasix and follow response.  20mg  lasix will not hit threshold for diuresis.  40 mg did not either Agree with MSO4   Continue BiPAP for comfrort Palliative care has been consulted.  Dorris Carnes MD

## 2020-09-01 NOTE — Progress Notes (Signed)
`  Patient very pleasant, was on bipap at start of shift. Patient is realistic about her condition and does not desire any interventions, only help with ease of breathing. She is A&Ox 4 and is "ready to go". She denies any pain, only discomfort with increased work of breathing. Goals of care discussed and patient desires to wean off bipap if possible, so that she may return home to assisted living and receive palliative care. RT in earlier to help transition patient to HFNC which was successful and patient was comfortable. PRN hourly morphine was initiated to assist with ease of breathing. The initial dose proved helpful and decreased patient's RR from 37 to 24. An hour later, another PRN dose given to see if RR could be decrease to WNL as well as to help decrease burden, as patient voiced she was getting fatigued and feeling hot. Unfortunately, this dose did not provide desired results and did not improve RR any; patient then asked to be placed back onto the bipap "for a break". At this time, patient stated that she did not think she would be able to return home. RT placed patient back onto bipap support, daughter at bedside and was allowed to ask questions. Dr. Venetia Constable notified of patient condition and return to bipap.

## 2020-09-01 NOTE — Progress Notes (Signed)
CSW met with pt and daughter at bedside, pt seemed to be struggling trying to catch her breath. She was sweating and stated she was hot. CSW went and notified RN of pt's distress. RT came in and examined pt to place bi-pap back on pt, CSW adv pt daughter that she would come back in a little while.  CSW was requested to come back in the room. CSW spoke with pt's daughter Annette outside of the room. Annette stated she understands her mother's wishes and respects them. She stated her mother is a DNR and no unnecessary interventions are required. Annette stated at this time, she would elect to have the pt go home with her (Annette) and have home hospice services through [facility].   CSW advised MD and reached out to [facility] to make the appropriate referral. CSW will continue to follow for any other needs that may arise.  

## 2020-09-02 ENCOUNTER — Telehealth: Payer: Self-pay | Admitting: *Deleted

## 2020-09-02 DIAGNOSIS — Z515 Encounter for palliative care: Secondary | ICD-10-CM

## 2020-09-02 DIAGNOSIS — J9601 Acute respiratory failure with hypoxia: Secondary | ICD-10-CM

## 2020-09-02 DIAGNOSIS — I350 Nonrheumatic aortic (valve) stenosis: Secondary | ICD-10-CM | POA: Diagnosis not present

## 2020-09-02 DIAGNOSIS — I5021 Acute systolic (congestive) heart failure: Secondary | ICD-10-CM | POA: Diagnosis not present

## 2020-09-02 LAB — BASIC METABOLIC PANEL
Anion gap: 13 (ref 5–15)
BUN: 46 mg/dL — ABNORMAL HIGH (ref 8–23)
CO2: 25 mmol/L (ref 22–32)
Calcium: 9.4 mg/dL (ref 8.9–10.3)
Chloride: 106 mmol/L (ref 98–111)
Creatinine, Ser: 1.13 mg/dL — ABNORMAL HIGH (ref 0.44–1.00)
GFR, Estimated: 44 mL/min — ABNORMAL LOW (ref >60)
Glucose, Bld: 175 mg/dL — ABNORMAL HIGH (ref 70–99)
Potassium: 5.2 mmol/L — ABNORMAL HIGH (ref 3.5–5.1)
Sodium: 144 mmol/L (ref 135–145)

## 2020-09-02 MED ORDER — ACETAMINOPHEN 325 MG PO TABS: 650.0000 mg | ORAL_TABLET | Freq: Four times a day (QID) | ORAL | Status: DC | PRN

## 2020-09-02 MED ORDER — HYDROMORPHONE BOLUS VIA INFUSION: 0.5000 mg | INTRAVENOUS | Status: DC | PRN

## 2020-09-02 MED ORDER — METOPROLOL TARTRATE 25 MG PO TABS
25.0000 mg | ORAL_TABLET | Freq: Two times a day (BID) | ORAL | Status: DC
  Administered 2020-09-02: 25 mg via ORAL
  Filled 2020-09-02: qty 1

## 2020-09-02 MED ORDER — HYDROMORPHONE BOLUS VIA INFUSION
0.5000 mg | INTRAVENOUS | Status: DC | PRN
  Administered 2020-09-02 (×2): 1 mg via INTRAVENOUS
  Filled 2020-09-02: qty 1

## 2020-09-02 MED ORDER — ACETAMINOPHEN 650 MG RE SUPP: 650.0000 mg | Freq: Four times a day (QID) | RECTAL | Status: DC | PRN

## 2020-09-02 MED ORDER — ONDANSETRON 4 MG PO TBDP
4.0000 mg | ORAL_TABLET | Freq: Four times a day (QID) | ORAL | Status: DC | PRN
  Filled 2020-09-02: qty 1

## 2020-09-02 MED ORDER — GLYCOPYRROLATE 1 MG PO TABS
1.0000 mg | ORAL_TABLET | ORAL | Status: DC | PRN
  Filled 2020-09-02: qty 1

## 2020-09-02 MED ORDER — HALOPERIDOL 0.5 MG PO TABS
0.5000 mg | ORAL_TABLET | ORAL | Status: DC | PRN
  Filled 2020-09-02: qty 1

## 2020-09-02 MED ORDER — GLYCOPYRROLATE 0.2 MG/ML IJ SOLN
0.2000 mg | INTRAMUSCULAR | Status: DC | PRN
  Administered 2020-09-02: 0.2 mg via INTRAVENOUS
  Filled 2020-09-02: qty 1

## 2020-09-02 MED ORDER — SODIUM CHLORIDE 0.9 % IV SOLN
0.5000 mg/h | INTRAVENOUS | Status: DC
  Administered 2020-09-02: 0.25 mg/h via INTRAVENOUS
  Filled 2020-09-02: qty 2.5

## 2020-09-02 MED ORDER — MORPHINE 100MG IN NS 100ML (1MG/ML) PREMIX INFUSION
2.0000 mg/h | INTRAVENOUS | Status: DC
  Filled 2020-09-02: qty 100

## 2020-09-02 MED ORDER — LORAZEPAM 2 MG/ML PO CONC: 1.0000 mg | ORAL | Status: DC | PRN

## 2020-09-02 MED ORDER — SODIUM POLYSTYRENE SULFONATE 15 GM/60ML PO SUSP
30.0000 g | Freq: Once | ORAL | Status: AC
  Administered 2020-09-02: 30 g via ORAL
  Filled 2020-09-02: qty 120

## 2020-09-02 MED ORDER — GLYCOPYRROLATE 0.2 MG/ML IJ SOLN: 0.2000 mg | INTRAMUSCULAR | Status: DC | PRN

## 2020-09-02 MED ORDER — HALOPERIDOL LACTATE 2 MG/ML PO CONC
0.5000 mg | ORAL | Status: DC | PRN
  Filled 2020-09-02: qty 0.3

## 2020-09-02 MED ORDER — HALOPERIDOL LACTATE 5 MG/ML IJ SOLN: 0.5000 mg | INTRAMUSCULAR | Status: DC | PRN

## 2020-09-02 MED ORDER — LORAZEPAM 1 MG PO TABS: 1.0000 mg | ORAL_TABLET | ORAL | Status: DC | PRN

## 2020-09-02 MED ORDER — BIOTENE DRY MOUTH MT LIQD: 15.0000 mL | OROMUCOSAL | Status: DC | PRN

## 2020-09-02 MED ORDER — LORAZEPAM 2 MG/ML IJ SOLN
1.0000 mg | INTRAMUSCULAR | Status: DC | PRN
  Administered 2020-09-02: 1 mg via INTRAVENOUS
  Filled 2020-09-02 (×2): qty 1

## 2020-09-02 MED ORDER — MORPHINE SULFATE (PF) 4 MG/ML IV SOLN
4.0000 mg | Freq: Once | INTRAVENOUS | Status: AC
  Administered 2020-09-02: 4 mg via INTRAVENOUS
  Filled 2020-09-02: qty 1

## 2020-09-02 MED ORDER — ONDANSETRON HCL 4 MG/2ML IJ SOLN: 4.0000 mg | Freq: Four times a day (QID) | INTRAMUSCULAR | Status: DC | PRN

## 2020-09-02 MED ORDER — POLYVINYL ALCOHOL 1.4 % OP SOLN
1.0000 [drp] | Freq: Four times a day (QID) | OPHTHALMIC | Status: DC | PRN
  Filled 2020-09-02: qty 15

## 2020-09-02 NOTE — Consult Note (Signed)
Consultation Note Date: 09/02/2020   Patient Name: Julie Walter  DOB: 01-18-1919  MRN: [id number]  Age / Sex: 86 y.o., female  PCP: Lauree Chandler, NP Referring Physician: Aileen Fass, Tammi Klippel, MD  Reason for Consultation: Establishing goals of care and Non pain symptom management  HPI/Patient Profile: 86 y.o. female with past medical history of breast cancer, aortic stenosis, colon cancer, chronic pain, memory loss, and osteoarthritis who was admitted on 09/07/2020 with acutely decompensated heart failure (troponin 4682, BNP 2794), and acute hypoxic respiratory failure.  Echocardiogram showed critical severe aortic stenosis, grade two diastolic heart failure, and a systolic EF of 30 - 27%.  Dr. Aileen Fass talked with the patient and realized that she does not want further aggressive measures.  Clinical Assessment and Goals of Care:  I have reviewed medical records including EPIC notes, labs and imaging, received report from Dr. Aileen Fass, examined the patient and met at bedside with her daughter Anne Ng and son in law  to discuss diagnosis prognosis, [place], EOL wishes, disposition and options.  I introduced Palliative Medicine as specialized medical care for people living with serious illness. It focuses on providing relief from the symptoms and stress of a serious illness.   [place] is alert and orientated and very pleasant to speak with even on BiPAP.  I ask her about her goals and she tells me that she never wants to have an episode like the one that brought her to the hospital ever again.  She tells me that she is ready to die and that she does not want to be a further burden to anyone.  She tells me that her father lived to be 28 and a brother lived to be 80.   I asked her how old she wanted to live to be and she replied 65 and 2 months. (that would have been approximately 6 months ago).  She  is very clear minded and rather upbeat as she speaks with me.  She moved in with her daughter 3 years ago and now lives in independent living.     I met with Anne Ng and asked her what she wanted for her mother.  Anne Ng wants her mother to live longer but she also respects her mother's goals and wishes and she accepts comfort measures.  I explained that we will allow family to visit and then take the BiPAP off placing Keasia on nasal cannula.  We will give her medication to ease any respiratory distress.    I explained to Anne Ng that we do not know how Dyane will do.  She may pass away, she may live for several hours to days and then pass, or she may survive several more weeks.  Anne Ng and her husband as well as the patient understand the uncertainty of her prognosis and wish to proceed step by step with comfort measures and see which way her body will go.  RN Junie Panning and I spoke about the plan for Drusilla's care.  She understands and has my pager number for easy communication.  Questions and concerns were addressed.  The family was encouraged to call with questions or concerns.      Primary Decision Maker:  PATIENT    SUMMARY OF RECOMMENDATIONS    Allow family to visit.  When ready start very low dose dilaudid.  Remove BiPAP mask and place nasal cannula. Proceed with comfort measures. If the patient does well and improves we will consider Hospice House or Hospice at home. If the patient declines comfort measures will be increased and she will pass away in the hospital. Family had already reached out to hospice as an outpatient but they were not yet engaged.  Code Status/Advance Care Planning:  DNR   Symptom Management:   Dilaudid drip for SOB with PRN boluses  Agree with IV lasix to drain fluid if possible.  Monitor closely to see how she progresses.  Additional Recommendations (Limitations, Scope, Preferences):  Full Comfort Care  Palliative Prophylaxis:    Frequent assessment of respiratory status  Psycho-social/Spiritual:  Desire for further Chaplaincy support:  Not at this time   Prognosis: hours to days. Based on acute decompensated heart failure, and NSTEMI    Discharge Planning: To Be Determined      Primary Diagnoses: Present on Admission: . Acute respiratory failure with hypoxia ([place])   I have reviewed the medical record, interviewed the patient and family, and examined the patient. The following aspects are pertinent.  Past Medical History:  Diagnosis Date  . Anemia   . Aortic stenosis   . Arthritis   . Breast cancer ([place])   . Carpal tunnel syndrome   . Chronic pain   . Colon cancer ([place])   . Fibromyalgia   . Hematuria   . History of hip replacement    bilateral  . Hyperglycemia   . Hyperlipidemia   . Hypertension   . Lumbago   . Memory loss   . Osteoarthritis   . Osteopenia    Social History   Socioeconomic History  . Marital status: Widowed    Spouse name: Not on file  . Number of children: Not on file  . Years of education: Not on file  . Highest education level: Not on file  Occupational History  . Not on file  Tobacco Use  . Smoking status: Former Smoker    Packs/day: 2.00    Years: 20.00    Pack years: 40.00    Quit date: 11/12/1986    Years since quitting: 33.8  . Smokeless tobacco: Never Used  . Tobacco comment: quit 25 years ago as of 2019   Vaping Use  . Vaping Use: Never used  Substance and Sexual Activity  . Alcohol use: Yes    Comment: 1 glass of wine every couple weeks  . Drug use: No  . Sexual activity: Not Currently  Other Topics Concern  . Not on file  Social History Narrative   Social History      Diet? light      Do you drink/eat things with caffeine? Coffee 1/day      Marital status?             widowed                       What year were you married? 1944      Do you live in a house, apartment, assisted living, condo, trailer, etc.? Independent living       Is it one or more stories? 3 stories  How many persons live in your home? 1      Do you have any pets in your home? (please list)  none      Highest level of education completed? High school + secretarial studies post      Current or past profession: Herbalist, Arts administrator      Do you exercise?                 yes                     Type & how often? Walking, back exercises      Advanced Directives      Do you have a living will? yes      Do you have a DNR form?       no                           If not, do you want to discuss one? yes      Do you have signed POA/HPOA for forms? yes      Functional Status      Do you have difficulty bathing or dressing yourself? no      Do you have difficulty preparing food or eating? no      Do you have difficulty managing your medications? no      Do you have difficulty managing your finances? no      Do you have difficulty affording your medications? no   Social Determinants of Health   Financial Resource Strain:   . Difficulty of Paying Living Expenses: Not on file  Food Insecurity:   . Worried About Charity fundraiser in the Last Year: Not on file  . Ran Out of Food in the Last Year: Not on file  Transportation Needs:   . Lack of Transportation (Medical): Not on file  . Lack of Transportation (Non-Medical): Not on file  Physical Activity:   . Days of Exercise per Week: Not on file  . Minutes of Exercise per Session: Not on file  Stress:   . Feeling of Stress : Not on file  Social Connections:   . Frequency of Communication with Friends and Family: Not on file  . Frequency of Social Gatherings with Friends and Family: Not on file  . Attends Religious Services: Not on file  . Active Member of Clubs or Organizations: Not on file  . Attends Archivist Meetings: Not on file  . Marital Status: Not on file   Family History  Problem Relation Age of Onset  . Cancer Mother   . Heart disease Mother   .  Cancer Sister   . Cancer Brother   . Cancer Sister   . Heart failure Brother   . Cancer Sister   . Heart failure Brother   . Hemachromatosis Son   . Diabetes Son     No Known Allergies    Vital Signs: BP (!) 107/59   Pulse 78   Temp 98.8 F (37.1 C) (Oral)   Resp (!) 23   Ht $R'4\' 10"'Pa$  (1.473 m)   Wt 55.6 kg   SpO2 91%   BMI 25.62 kg/m  Pain Scale: 0-10   Pain Score: 0-No pain   SpO2: SpO2: 91 % O2 Device:SpO2: 91 % O2 Flow Rate: .O2 Flow Rate (L/min): 10 L/min    Palliative Assessment/Data:  20%     Time In:  10:00 Time Out: 11:00 Time Total: 60 min. Visit consisted of counseling and education dealing with the complex and emotionally intense issues surrounding the need for palliative care and symptom management in the setting of serious and potentially life-threatening illness. Greater than 50%  of this time was spent counseling and coordinating care related to the above assessment and plan.  Signed by: Florentina Jenny, PA-C Palliative Medicine  Please contact Palliative Medicine Team phone at [phone] for questions and concerns.  For individual provider: See Shea Evans

## 2020-09-02 NOTE — Progress Notes (Signed)
Patient is comfortable with dilaudid drip, Glycopyrolate given for increased secretions; effective. Agonal resps at 10 per minute with 5-8seconds of apnea in between. Family at bedside. Awaiting arrival of son this evening- please allow visitation.

## 2020-09-02 NOTE — Progress Notes (Signed)
Late Entry: Spike morphine drip bag wasted in presence of Gildardo Cranker, Agricultural consultant. This was replaced with dilaudid drip

## 2020-09-02 NOTE — Progress Notes (Signed)
Does not want at this time

## 2020-09-02 NOTE — Progress Notes (Signed)
Initiation of morphine drip has been on hold so that daughter can come be with her. Called daughter to ask when she would arrive, as it has been almost 2 hours; daughter said she would be here in about 45 minutes. Will wait until then per patient wishes.

## 2020-09-02 NOTE — Progress Notes (Signed)
Palliative Medicine RN Note: Rec'd call from pt's daughter, who rec'd call about delivery of hospice equipment. Decision has been made for full comfort care, and pt has had marked decline; she is unlikely to leave [facility]. Per instructions from Florentina Jenny, I notifed [facility] to hold on equipment, as it is not needed for now; if there is a surprise, we will reach out in a few days.   Marjie Skiff Patricia Perales, RN, BSN, [facility] Palliative Medicine Team 09/02/2020 1:05 PM Office [phone]

## 2020-09-02 NOTE — Progress Notes (Signed)
[facility] PROGRESS NOTE    Progress Note  Janely Gullickson  ZLD:[id number] DOB: 09-Jul-1919 DOA: 08/25/2020 PCP: Lauree Chandler, NP     Brief Narrative:   Aerial Dilley is an 86 y.o. female past medical history of severe aortic stenosis essential hypertension presented with hypoxia and dizziness she relate that she was evaluated about 3 years prior to admission for a TAVR's at [place] which she refused due to her advanced age she was lost to follow-up when she arrived to [facility] she relates her to barely walk 5 minutes she feels lightheadedness and fatigue.  With nursing home she called staff he found that her saturations were 65%, sent to the ED placed on BiPAP giving magnesium Solu-Medrol and albuterol in the ED.  CT angio for PE was negative  Assessment/Plan:   Acute respiratory failure with hypoxia 2/2 Severe aortic stenosis: She has a history of underlying aortic stenosis which is severe.  She likely has systolic heart failure. She was started on IV Lasix on admission her creatinine started to rise and she is becoming hyperkalemic. She was very clear and she is mentally sharp on admission she relates she was ready to move to comfort care. As she is not responding to treatment and still on BiPAP discussed with her the need of IV morphine and she relates she would like to use it so we will start her on a morphine drip and discontinue most of her meds. Awaiting hospice and palliative care to try to transition her home with hospice, this was discussed with the daughter and she is in agreement. She is not septic, she was not septic on admission.  New left bundle branch block/elevated troponins/chest pain Significantly elevated her cardiac biomarkers, with a new left bundle branch block compared to EKG from 2019.  She relates no chest pain at the time of my interview. I believe these changes are due to her severe aortic stenosis. Cardiology has been consulted.  Mild  leukocytosis: Likely reactive, she has remained afebrile discontinue all antibiotics.  Essential hypertension: Discontinue all antihypertensive medication.  Type 2 diabetes mellitus without complication, without long-term current use of insulin (HCC) Blood glucose improved from 300-1 60 likely reactive. She would like to be made comfortable I will go ahead and try to improve her breathing and try to get her off BiPAP to high flow or nonrebreather.  Hyperkalemia: We will give her oral Kayexelate.  Goals of care/end-of-life: The patient is very clear today again that she wants to go home she wants no further aggressive treatment I talked to her about IV morphine and she is in agreement to try to get her off the BiPAP. Awaiting palliative care assistance.   DVT prophylaxis: lovenox Family Communication:Daughter Status is: Inpatient  Remains inpatient appropriate because:Hemodynamically unstable   Dispo: The patient is from: ALF              Anticipated d/c is to: Home with hospice              Anticipated d/c date is: > 3 days              Patient currently is not medically stable to d/c.  Awaiting placement to home with hospice care.  Also awaiting palliative care meeting.     Code Status:     Code Status Orders  (From admission, onward)         Start     Ordered   08/22/2020 1623  Do not attempt resuscitation (  DNR)  Continuous       Question Answer Comment  In the event of cardiac or respiratory ARREST Do not call a code blue   In the event of cardiac or respiratory ARREST Do not perform Intubation, CPR, defibrillation or ACLS   In the event of cardiac or respiratory ARREST Use medication by any route, position, wound care, and other measures to relive pain and suffering. May use oxygen, suction and manual treatment of airway obstruction as needed for comfort.      08/30/2020 1624        Code Status History    Date Active Date Inactive Code Status Order ID Comments  User Context   06/23/2020 1550 08/22/2020 1150 DNR [id number]  Lauree Chandler, NP Outpatient   Advance Care Planning Activity        IV Access:    Peripheral IV   Procedures and diagnostic studies:   DG Chest 1 View  Result Date: 09/01/2020 CLINICAL DATA:  CHF. EXAM: CHEST  1 VIEW COMPARISON:  CT 08/16/2020.  Chest x-ray 08/20/2020. FINDINGS: Mediastinum stable. Heart size stable. Diffuse bilateral interstitial prominence and bibasilar atelectasis/infiltrates again noted. Tiny bilateral pleural effusions can not be excluded. No pneumothorax. Stable deformity right proximal humerus. Degenerative change both shoulders and thoracic spine. Surgical clips right upper quadrant. IMPRESSION: Diffuse bilateral interstitial prominence again noted. Interstitial edema and/or pneumonitis could present this fashion. Bibasilar atelectasis/infiltrates again noted. Tiny bilateral pleural effusions can not be excluded. Electronically Signed   By: Marcello Moores  Register   On: 09/01/2020 05:57   CT Angio Chest PE W/Cm &/Or Wo Cm  Result Date: 08/24/2020 CLINICAL DATA:  Shortness of breath EXAM: CT ANGIOGRAPHY CHEST WITH CONTRAST TECHNIQUE: Multidetector CT imaging of the chest was performed using the standard protocol during bolus administration of intravenous contrast. Multiplanar CT image reconstructions and MIPs were obtained to evaluate the vascular anatomy. CONTRAST:  11mL OMNIPAQUE IOHEXOL 350 MG/ML SOLN COMPARISON:  None. FINDINGS: Cardiovascular: Satisfactory opacification of the pulmonary arteries to the segmental level. No evidence of pulmonary embolism. Normal heart size. No pericardial effusion. Coronary artery calcification. Thoracic aorta is normal in caliber with atherosclerotic calcification. Mediastinum/Nodes: No enlarged lymph nodes. Thyroid is unremarkable. Lungs/Pleura: Patchy bilateral ground-glass density and consolidation with basilar predominance. Small, right greater than left pleural  effusions. No pneumothorax. Upper Abdomen: No acute abnormality Musculoskeletal: Multilevel degenerative changes of the included spine. No acute abnormality. Review of the MIP images confirms the above findings. IMPRESSION: No acute pulmonary embolism. Bilateral pulmonary opacities. Small bilateral pleural effusions. May reflect pulmonary edema or atypical/viral pneumonia. Electronically Signed   By: Macy Mis M.D.   On: 08/22/2020 14:58   DG Chest Port 1 View  Result Date: 08/28/2020 CLINICAL DATA:  Shortness of breath EXAM: PORTABLE CHEST 1 VIEW COMPARISON:  None. FINDINGS: Heart size is upper limits of normal. Atherosclerotic calcification of the aortic knob. Dense right basilar consolidation. Small right pleural effusion. Prominent interstitial markings throughout both lungs. No pneumothorax. Advanced degenerative changes of the bilateral glenohumeral joints. IMPRESSION: Dense right basilar consolidation with small right pleural effusion. Findings may represent a combination of atelectasis and pneumonia. Radiographic follow-up to resolution is recommended. Electronically Signed   By: Davina Poke D.O.   On: 08/20/2020 12:35   ECHOCARDIOGRAM COMPLETE  Result Date: 09/01/2020    ECHOCARDIOGRAM REPORT   Patient Name:   LANDRA HOWZE Date of Exam: 09/01/2020 Medical Rec #:  [id number]         Height:  58.0 in Accession #:    [id number]        Weight:       122.6 lb Date of Birth:  [birth year]         BSA:          1.479 m Patient Age:    53 years          BP:           115/75 mmHg Patient Gender: F                 HR:           86 bpm. Exam Location:  Inpatient Procedure: 2D Echo, Color Doppler and Cardiac Doppler Indications:    Aortic Stenosis i25.0  History:        Patient has no prior history of Echocardiogram examinations.                 CHF; Risk Factors:Hypertension, Diabetes and Dyslipidemia.  Sonographer:    Raquel Sarna Senior RDCS Referring Phys: [id number] [place]  1.  Critical aortic stenosis with peak/mean transaortic gradient 104/69 mmHg, AVA 0.27 cm2 and dimensionless index 0.11.  2. Left ventricular ejection fraction, by estimation, is 30 to 35%. The left ventricle has moderately decreased function. The left ventricle demonstrates global hypokinesis. Left ventricular diastolic parameters are consistent with Grade II diastolic dysfunction (pseudonormalization). Elevated left atrial pressure.  3. Right ventricular systolic function is normal. The right ventricular size is normal. There is normal pulmonary artery systolic pressure.  4. Left atrial size was mildly dilated.  5. A small pericardial effusion is present. The pericardial effusion is posterior to the left ventricle. Large pleural effusion in the left lateral region.  6. The mitral valve is degenerative. Moderate mitral valve regurgitation. No evidence of mitral stenosis.  7. The aortic valve is normal in structure. There is severe calcifcation of the aortic valve. There is severe thickening of the aortic valve. Aortic valve regurgitation is not visualized. Severe aortic valve stenosis. Aortic valve mean gradient measures  69.0 mmHg.  8. The inferior vena cava is normal in size with greater than 50% respiratory variability, suggesting right atrial pressure of 3 mmHg. FINDINGS  Left Ventricle: Left ventricular ejection fraction, by estimation, is 30 to 35%. The left ventricle has moderately decreased function. The left ventricle demonstrates global hypokinesis. The left ventricular internal cavity size was normal in size. There is no left ventricular hypertrophy. Left ventricular diastolic parameters are consistent with Grade II diastolic dysfunction (pseudonormalization). Elevated left atrial pressure. Right Ventricle: The right ventricular size is normal. No increase in right ventricular wall thickness. Right ventricular systolic function is normal. There is normal pulmonary artery systolic pressure. Left Atrium: Left  atrial size was mildly dilated. Right Atrium: Right atrial size was normal in size. Pericardium: A small pericardial effusion is present. The pericardial effusion is posterior to the left ventricle. Mitral Valve: The mitral valve is degenerative in appearance. Moderate mitral valve regurgitation. No evidence of mitral valve stenosis. MV peak gradient, 12.0 mmHg. The mean mitral valve gradient is 3.0 mmHg. Tricuspid Valve: The tricuspid valve is normal in structure. Tricuspid valve regurgitation is mild . No evidence of tricuspid stenosis. Aortic Valve: The aortic valve is normal in structure. There is severe calcifcation of the aortic valve. There is severe thickening of the aortic valve. Aortic valve regurgitation is not visualized. Severe aortic stenosis is present. Aortic valve mean gradient measures 69.0 mmHg. Aortic valve peak gradient measures  94.7 mmHg. Aortic valve area, by VTI measures 0.28 cm. Pulmonic Valve: The pulmonic valve was normal in structure. Pulmonic valve regurgitation is mild. No evidence of pulmonic stenosis. Aorta: The aortic root is normal in size and structure. Venous: The inferior vena cava is normal in size with greater than 50% respiratory variability, suggesting right atrial pressure of 3 mmHg. IAS/Shunts: No atrial level shunt detected by color flow Doppler. Additional Comments: There is a large pleural effusion in the left lateral region.  LEFT VENTRICLE PLAX 2D LVIDd:         3.90 cm LVIDs:         3.30 cm LV PW:         1.40 cm LV IVS:        1.70 cm LVOT diam:     2.00 cm LV SV:         30 LV SV Index:   20 LVOT Area:     3.14 cm  LV Volumes (MOD) LV vol d, MOD A2C: 90.9 ml LV vol d, MOD A4C: 77.3 ml LV vol s, MOD A2C: 50.4 ml LV vol s, MOD A4C: 42.5 ml LV SV MOD A2C:     40.5 ml LV SV MOD A4C:     77.3 ml LV SV MOD BP:      38.1 ml RIGHT VENTRICLE RV S prime:     10.20 cm/s TAPSE (M-mode): 1.9 cm LEFT ATRIUM             Index       RIGHT ATRIUM          Index LA diam:         3.50 cm 2.37 cm/m  RA Area:     7.10 cm LA Vol (A2C):   65.8 ml 44.49 ml/m RA Volume:   12.10 ml 8.18 ml/m LA Vol (A4C):   51.0 ml 34.48 ml/m LA Biplane Vol: 60.2 ml 40.70 ml/m  AORTIC VALVE AV Area (Vmax):    0.35 cm AV Area (Vmean):   0.33 cm AV Area (VTI):     0.28 cm AV Vmax:           486.67 cm/s AV Vmean:          367.667 cm/s AV VTI:            1.093 m AV Peak Grad:      94.7 mmHg AV Mean Grad:      69.0 mmHg LVOT Vmax:         54.55 cm/s LVOT Vmean:        38.575 cm/s LVOT VTI:          0.096 m LVOT/AV VTI ratio: 0.09  AORTA Ao Root diam: 2.90 cm Ao Asc diam:  3.30 cm MITRAL VALVE MV Peak grad: 12.0 mmHg      SHUNTS MV Mean grad: 3.0 mmHg       Systemic VTI:  0.10 m MV Vmax:      1.73 m/s       Systemic Diam: 2.00 cm MV Vmean:     69.1 cm/s MR Peak grad:    148.4 mmHg MR Mean grad:    83.0 mmHg MR Vmax:         609.00 cm/s MR Vmean:        427.0 cm/s MR PISA:         1.01 cm MR PISA Eff ROA: 7 mm MR PISA Radius:  0.40 cm Ena Dawley MD Electronically signed  by Ena Dawley MD Signature Date/Time: 09/01/2020/8:58:46 PM    Final      Medical Consultants:    None.  Anti-Infectives:   none  Subjective:    Verdene Lennert she relates her breathing is better but the BiPAP mask is bothering her.  Objective:    Vitals:   09/01/20 2045 09/01/20 2351 09/02/20 0000 09/02/20 0400  BP: 98/61 (!) 97/47 109/70 (!) 107/59  Pulse: 88 72 73 69  Resp: (!) 25 (!) 23 (!) 25 (!) 23  Temp: 97.7 F (36.5 C) (!) 97.4 F (36.3 C) 97.7 F (36.5 C)   TempSrc: Axillary Axillary Axillary   SpO2: 96% 97% 96% 91%  Weight:    55.6 kg  Height:       SpO2: 91 % O2 Flow Rate (L/min): 10 L/min FiO2 (%): 50 %   Intake/Output Summary (Last 24 hours) at 09/02/2020 0733 Last data filed at 09/02/2020 0400 Gross per 24 hour  Intake 50 ml  Output 0 ml  Net 50 ml   Filed Weights   09/01/2020 1800 08/26/2020 2030 09/02/20 0400  Weight: 52.6 kg 55.6 kg 55.6 kg    Exam: General exam: In  no acute distress. Respiratory system: Good air movement and clear to auscultation. Cardiovascular system: S1 & S2 heard, RRR. + JVD. Gastrointestinal system: Abdomen is nondistended, soft and nontender.  Extremities: No pedal edema. Skin: No rashes, lesions or ulcers Psychiatry: Judgement and insight appear normal. Mood & affect appropriate.   Data Reviewed:    Labs: Basic Metabolic Panel: Recent Labs  Lab 08/12/2020 1206 09/04/2020 1206 08/24/2020 1557 08/23/2020 1557 09/01/20 0307 09/02/20 0558  NA 141  --  140  --  142 144  K 4.0   < > 4.6   < > 4.6 5.2*  CL 103  --   --   --  106 106  CO2 21*  --   --   --  22 25  GLUCOSE 307*  --   --   --  160* 175*  BUN 18  --   --   --  26* 46*  CREATININE 1.00  --   --   --  0.90 1.13*  CALCIUM 9.4  --   --   --  9.1 9.4   < > = values in this interval not displayed.   GFR Estimated Creatinine Clearance: 20 mL/min (A) (by C-G formula based on SCr of 1.13 mg/dL (H)). Liver Function Tests: Recent Labs  Lab 09/02/2020 1206  AST 31  ALT 13  ALKPHOS 49  BILITOT 1.0  PROT 7.1  ALBUMIN 3.9   No results for input(s): LIPASE, AMYLASE in the last 168 hours. No results for input(s): AMMONIA in the last 168 hours. Coagulation profile No results for input(s): INR, PROTIME in the last 168 hours. COVID-19 Labs  No results for input(s): DDIMER, FERRITIN, LDH, CRP in the last 72 hours.  Lab Results  Component Value Date   [place] NEGATIVE 08/13/2020    CBC: Recent Labs  Lab 09/01/2020 1206 08/26/2020 1557  WBC 15.5*  --   NEUTROABS 9.6*  --   HGB 16.0* 16.0*  HCT 50.8* 47.0*  MCV 106.7*  --   PLT 186  --    Cardiac Enzymes: No results for input(s): CKTOTAL, CKMB, CKMBINDEX, TROPONINI in the last 168 hours. BNP (last 3 results) No results for input(s): PROBNP in the last 8760 hours. CBG: No results for input(s): GLUCAP in the last 168 hours. D-Dimer: No  results for input(s): DDIMER in the last 72 hours. Hgb A1c: No results  for input(s): HGBA1C in the last 72 hours. Lipid Profile: No results for input(s): CHOL, HDL, LDLCALC, TRIG, CHOLHDL, LDLDIRECT in the last 72 hours. Thyroid function studies: No results for input(s): TSH, T4TOTAL, T3FREE, THYROIDAB in the last 72 hours.  Invalid input(s): FREET3 Anemia work up: No results for input(s): VITAMINB12, FOLATE, FERRITIN, TIBC, IRON, RETICCTPCT in the last 72 hours. Sepsis Labs: Recent Labs  Lab 08/23/2020 1206 08/16/2020 2042  PROCALCITON  --  1.07  WBC 15.5*  --   LATICACIDVEN  --  2.2*   Microbiology Recent Results (from the past 240 hour(s))  Culture, blood (Routine X 2) w Reflex to ID Panel     Status: None (Preliminary result)   Collection Time: 08/19/2020 12:00 PM   Specimen: BLOOD  Result Value Ref Range Status   Specimen Description BLOOD RIGHT ANTECUBITAL  Final   Special Requests   Final    BOTTLES DRAWN AEROBIC AND ANAEROBIC Blood Culture results may not be optimal due to an inadequate volume of blood received in culture bottles   Culture   Final    NO GROWTH 1 DAY Performed at [facility] [address] [place], [place] [address]    Report Status PENDING  Incomplete  Culture, blood (Routine X 2) w Reflex to ID Panel     Status: None (Preliminary result)   Collection Time: 09/07/2020 12:05 PM   Specimen: BLOOD  Result Value Ref Range Status   Specimen Description BLOOD RIGHT ANTECUBITAL  Final   Special Requests   Final    BOTTLES DRAWN AEROBIC AND ANAEROBIC Blood Culture results may not be optimal due to an inadequate volume of blood received in culture bottles   Culture   Final    NO GROWTH 1 DAY Performed at [facility], [place] [address], [place], [place] [address]    Report Status PENDING  Incomplete  Resp Panel by RT PCR (RSV, Flu A&B, Covid) - Nasopharyngeal Swab     Status: None   Collection Time: 09/04/2020 12:09 PM   Specimen: Nasopharyngeal Swab  Result Value Ref Range Status   SARS Coronavirus 2 by RT PCR NEGATIVE  NEGATIVE Final    Comment: (NOTE) SARS-CoV-2 target nucleic acids are NOT DETECTED.  The SARS-CoV-2 RNA is generally detectable in upper respiratoy specimens during the acute phase of infection. The lowest concentration of SARS-CoV-2 viral copies this assay can detect is 131 copies/mL. A negative result does not preclude SARS-Cov-2 infection and should not be used as the sole basis for treatment or other patient management decisions. A negative result may occur with  improper specimen collection/handling, submission of specimen other than nasopharyngeal swab, presence of viral mutation(s) within the areas targeted by this assay, and inadequate number of viral copies (<131 copies/mL). A negative result must be combined with clinical observations, patient history, and epidemiological information. The expected result is Negative.  Fact Sheet for Patients:  PinkCheek.be  Fact Sheet for Healthcare Providers:  GravelBags.it  This test is no t yet approved or cleared by the [place] FDA and  has been authorized for detection and/or diagnosis of SARS-CoV-2 by FDA under an Emergency Use Authorization (EUA). This EUA will remain  in effect (meaning this test can be used) for the duration of the COVID-19 declaration under Section 564(b)(1) of the Act, 21 U.S.C. section 360bbb-3(b)(1), unless the authorization is terminated or revoked sooner.  Influenza A by PCR NEGATIVE NEGATIVE Final   Influenza B by PCR NEGATIVE NEGATIVE Final    Comment: (NOTE) The Xpert Xpress SARS-CoV-2/FLU/RSV assay is intended as an aid in  the diagnosis of influenza from Nasopharyngeal swab specimens and  should not be used as a sole basis for treatment. Nasal washings and  aspirates are unacceptable for Xpert Xpress SARS-CoV-2/FLU/RSV  testing.  Fact Sheet for Patients: PinkCheek.be  Fact Sheet for Healthcare  Providers: GravelBags.it  This test is not yet approved or cleared by the [place] FDA and  has been authorized for detection and/or diagnosis of SARS-CoV-2 by  FDA under an Emergency Use Authorization (EUA). This EUA will remain  in effect (meaning this test can be used) for the duration of the  Covid-19 declaration under Section 564(b)(1) of the Act, 21  U.S.C. section 360bbb-3(b)(1), unless the authorization is  terminated or revoked.    Respiratory Syncytial Virus by PCR NEGATIVE NEGATIVE Final    Comment: (NOTE) Fact Sheet for Patients: PinkCheek.be  Fact Sheet for Healthcare Providers: GravelBags.it  This test is not yet approved or cleared by the [place] FDA and  has been authorized for detection and/or diagnosis of SARS-CoV-2 by  FDA under an Emergency Use Authorization (EUA). This EUA will remain  in effect (meaning this test can be used) for the duration of the  COVID-19 declaration under Section 564(b)(1) of the Act, 21 U.S.C.  section 360bbb-3(b)(1), unless the authorization is terminated or  revoked. Performed at [facility], [place] [address], [place], [place] [address]   MRSA PCR Screening     Status: None   Collection Time: 09/01/20  5:25 AM   Specimen: Nasopharyngeal  Result Value Ref Range Status   MRSA by PCR NEGATIVE NEGATIVE Final    Comment:        The GeneXpert MRSA Assay (FDA approved for NASAL specimens only), is one component of a comprehensive MRSA colonization surveillance program. It is not intended to diagnose MRSA infection nor to guide or monitor treatment for MRSA infections. Performed at [facility], [place] [address] [place], [place] [address]      Medications:    diclofenac Sodium  2 g Topical QID   enoxaparin (LOVENOX) injection  30 mg Subcutaneous Q24H   furosemide  80 mg Intravenous Once   loratadine  10 mg Oral Daily    metoprolol tartrate  25 mg Oral BID    morphine injection  4 mg Intravenous Once   polyethylene glycol  17 g Oral Daily   sodium chloride flush  3 mL Intravenous Q12H   sodium polystyrene  30 g Oral Once   Continuous Infusions:  sodium chloride     morphine        LOS: 2 days   Charlynne Cousins  [facility]  09/02/2020, 7:33 AM

## 2020-09-02 NOTE — Telephone Encounter (Signed)
Geri, with Hospice called and stated that patient is Jessica's patient and wanted to know if she would be attending. Patient is short stay Patient is being discharged from the hospital this weekend and needs immediate Hospice.   Verbal orders given.

## 2020-09-02 NOTE — Progress Notes (Signed)
° ° °  Reviewed note from Dr Aileen Fass  Note plans for comfort care   I agree with plans given severity of AS and LV dysfunction. WIll sign off   Please call with questions.  Dorris Carnes MD

## 2020-09-02 NOTE — Progress Notes (Signed)
Patient started on dilaudid drip around noon. She has required one dose of ativan and two boluses of dilaudid, but is now resting comfortably with resps at 12bpm. She has just begun to develop increased secretions, so PRN IV glycopyrrolate given. Yankauer suction set up in room. Patient is transitioning quickly but appears to be comfortable. Will continue to watch and adjust meds as needed.

## 2020-09-02 NOTE — Progress Notes (Signed)
Daughter arrived and spoke in depth with palliative care tem. New orders do DC morphine drip and change to low dose dilauded with bolus option for distress. Goal is to keep comfortable on 4L O2 or less. Palliative team would like updates throughout the day. Patient and family in agreement with POC.

## 2020-09-03 DIAGNOSIS — I5021 Acute systolic (congestive) heart failure: Secondary | ICD-10-CM

## 2020-09-03 DIAGNOSIS — J9601 Acute respiratory failure with hypoxia: Secondary | ICD-10-CM | POA: Diagnosis not present

## 2020-09-03 DIAGNOSIS — Z515 Encounter for palliative care: Secondary | ICD-10-CM | POA: Diagnosis not present

## 2020-09-03 DIAGNOSIS — I350 Nonrheumatic aortic (valve) stenosis: Secondary | ICD-10-CM | POA: Diagnosis not present

## 2020-09-03 MED ORDER — GLYCOPYRROLATE 0.2 MG/ML IJ SOLN: 0.2000 mg | INTRAMUSCULAR | Status: DC | PRN

## 2020-09-03 MED ORDER — HYDROMORPHONE BOLUS VIA INFUSION
1.0000 mg | INTRAVENOUS | Status: DC | PRN
  Filled 2020-09-03: qty 1

## 2020-09-03 MED ORDER — GLYCOPYRROLATE 1 MG PO TABS
1.0000 mg | ORAL_TABLET | ORAL | Status: DC | PRN
  Filled 2020-09-03: qty 1

## 2020-09-03 MED ORDER — GLYCOPYRROLATE 0.2 MG/ML IJ SOLN: 0.4000 mg | INTRAMUSCULAR | Status: DC | PRN

## 2020-09-03 MED ORDER — LORAZEPAM 2 MG/ML IJ SOLN
0.5000 mg | Freq: Two times a day (BID) | INTRAMUSCULAR | Status: DC
  Administered 2020-09-03: 0.5 mg via INTRAVENOUS

## 2020-09-05 LAB — CULTURE, BLOOD (ROUTINE X 2)
Culture: NO GROWTH
Culture: NO GROWTH

## 2020-09-12 NOTE — Progress Notes (Signed)
11:15-no respirations on observation. No carotid pulse palpation. No heart beat or respirations on auscultations by 2 RNs. Pupils fixed and dilated. Time of death 11:15am. Patient daughter called and informed. She will be coming shortly.

## 2020-09-12 NOTE — Progress Notes (Addendum)
Dr Olevia Bowens informed  via secure chat that  patient expired 11:15am

## 2020-09-12 NOTE — Progress Notes (Signed)
Daily Progress Note   Patient Name: Julie Walter       Date: 21-Sep-2020 DOB: 1919/06/09  Age: 86 y.o. MRN#: [id number] Attending Physician: Charlynne Cousins, MD Primary Care Physician: Lauree Chandler, NP Admit Date: 09/08/2020  Reason for Consultation/Follow-up: symptom management   Patient is non-verbal and actively dying.  Her breaths are short and shallow.  She appears comfortable and non-responsive.  She is on 11L of HFNC.   I turned this down slightly with no visible change in patient's level of comfort.  After 15 min I turned it down again to 5L and monitored the patient for any signs of distress or discomfort.  There was none.    Spoke with Jerral Bonito at bedside.   Assessment: Patient actively dying of heart failure and critical aortic stenosis   Patient Profile/HPI:  86 y.o. female with past medical history of breast cancer, aortic stenosis, colon cancer, chronic pain, memory loss, and osteoarthritis who was admitted on 09/04/2020 with acutely decompensated heart failure (troponin 4682, BNP 2794), and acute hypoxic respiratory failure.  Echocardiogram showed critical severe aortic stenosis, grade two diastolic heart failure, and a systolic EF of 30 - 83%.  Dr. Aileen Fass talked with the patient and realized that she does not want further aggressive measures.  Length of Stay: 3   Vital Signs: BP (!) 107/59   Pulse 78   Temp (!) 100.8 F (38.2 C)   Resp (!) 23   Ht 4\' 10"  (1.473 m)   Wt 55.6 kg   SpO2 91%   BMI 25.62 kg/m  SpO2: SpO2: 91 % O2 Device: O2 Device: Bi-PAP O2 Flow Rate: O2 Flow Rate (L/min): 10 L/min       Palliative Assessment/Data: 10%     Palliative Care Plan    Recommendations/Plan:  Continue current care.  Patient comfortable and  actively dying  Code Status:  DNR  Prognosis:  Minutes to hours  Discharge Planning:  Anticipated Hospital Death  Care plan was discussed with Earlie Server, RN at bedside  Thank you for allowing the Palliative Medicine Team to assist in the care of this patient.  Total time spent: 25 minutes     Greater than 50%  of this time was spent counseling and coordinating care related to the above assessment and plan.  Florentina Jenny, PA-C  Palliative Medicine  Please contact Palliative MedicineTeam phone at [phone] for questions and concerns between 7 am - 7 pm.   Please see AMION for individual provider pager numbers.

## 2020-09-12 NOTE — Progress Notes (Signed)
[facility] PROGRESS NOTE    Progress Note  Julie Walter  KYH:[id number] DOB: 23-Dec-1918 DOA: 08/12/2020 PCP: Lauree Chandler, NP     Brief Narrative:   Julie Walter is an 86 y.o. female past medical history of severe aortic stenosis essential hypertension presented with hypoxia and dizziness she relate that she was evaluated about 3 years prior to admission for a TAVR's at [place] which she refused due to her advanced age she was lost to follow-up when she arrived to [facility] she relates her to barely walk 5 minutes she feels lightheadedness and fatigue.  With nursing home she called staff he found that her saturations were 65%, sent to the ED placed on BiPAP giving magnesium Solu-Medrol and albuterol in the ED.  CT angio for PE was negative  Assessment/Plan:   Acute respiratory failure with hypoxia 2/2 Severe aortic stenosis: She has a history of underlying aortic stenosis which is severe.  She likely has systolic heart failure. Patient was very clear that she did not want any aggressive treatment, she was started on IV morphine which has now been switched to IV Dilaudid by palliative care. She will probably diet how she has agonal breathing this morning. She is not septic, she was not septic on admission. Please do not check blood pressure on patient.  New left bundle branch block/elevated troponins/chest pain Significantly elevated her cardiac biomarkers, with a new left bundle branch block compared to EKG from 2019.   Mild leukocytosis: Essential hypertension: Discontinue all antihypertensive medication.  Type 2 diabetes mellitus without complication, without long-term current use of insulin (HCC) Hyperkalemia: Goals of care/end-of-life:  DVT prophylaxis: lovenox Family Communication:Daughter Status is: Inpatient  Remains inpatient appropriate because:Hemodynamically unstable   Dispo: The patient is from: ALF              Anticipated d/c is to: Home  with hospice              Anticipated d/c date is: > 3 days              Patient currently is not medically stable to d/c.  Awaiting placement to home with hospice care.  Also awaiting palliative care meeting.     Code Status:     Code Status Orders  (From admission, onward)         Start     Ordered   08/12/2020 1623  Do not attempt resuscitation (DNR)  Continuous       Question Answer Comment  In the event of cardiac or respiratory ARREST Do not call a code blue   In the event of cardiac or respiratory ARREST Do not perform Intubation, CPR, defibrillation or ACLS   In the event of cardiac or respiratory ARREST Use medication by any route, position, wound care, and other measures to relive pain and suffering. May use oxygen, suction and manual treatment of airway obstruction as needed for comfort.      08/23/2020 1624        Code Status History    Date Active Date Inactive Code Status Order ID Comments User Context   06/23/2020 1550 08/27/2020 1150 DNR [id number]  Lauree Chandler, NP Outpatient   Advance Care Planning Activity        IV Access:    Peripheral IV   Procedures and diagnostic studies:   ECHOCARDIOGRAM COMPLETE  Result Date: 09/01/2020    ECHOCARDIOGRAM REPORT   Patient Name:   Julie Walter Date of Exam: 09/01/2020 Medical Rec #:  [id number]         Height:       58.0 in Accession #:    [id number]        Weight:       122.6 lb Date of Birth:  20-Oct-1919         BSA:          1.479 m Patient Age:    64 years          BP:           115/75 mmHg Patient Gender: F                 HR:           86 bpm. Exam Location:  Inpatient Procedure: 2D Echo, Color Doppler and Cardiac Doppler Indications:    Aortic Stenosis i25.0  History:        Patient has no prior history of Echocardiogram examinations.                 CHF; Risk Factors:Hypertension, Diabetes and Dyslipidemia.  Sonographer:    Raquel Sarna Senior RDCS Referring Phys: [id number] [place]  1.  Critical aortic stenosis with peak/mean transaortic gradient 104/69 mmHg, AVA 0.27 cm2 and dimensionless index 0.11.  2. Left ventricular ejection fraction, by estimation, is 30 to 35%. The left ventricle has moderately decreased function. The left ventricle demonstrates global hypokinesis. Left ventricular diastolic parameters are consistent with Grade II diastolic dysfunction (pseudonormalization). Elevated left atrial pressure.  3. Right ventricular systolic function is normal. The right ventricular size is normal. There is normal pulmonary artery systolic pressure.  4. Left atrial size was mildly dilated.  5. A small pericardial effusion is present. The pericardial effusion is posterior to the left ventricle. Large pleural effusion in the left lateral region.  6. The mitral valve is degenerative. Moderate mitral valve regurgitation. No evidence of mitral stenosis.  7. The aortic valve is normal in structure. There is severe calcifcation of the aortic valve. There is severe thickening of the aortic valve. Aortic valve regurgitation is not visualized. Severe aortic valve stenosis. Aortic valve mean gradient measures  69.0 mmHg.  8. The inferior vena cava is normal in size with greater than 50% respiratory variability, suggesting right atrial pressure of 3 mmHg. FINDINGS  Left Ventricle: Left ventricular ejection fraction, by estimation, is 30 to 35%. The left ventricle has moderately decreased function. The left ventricle demonstrates global hypokinesis. The left ventricular internal cavity size was normal in size. There is no left ventricular hypertrophy. Left ventricular diastolic parameters are consistent with Grade II diastolic dysfunction (pseudonormalization). Elevated left atrial pressure. Right Ventricle: The right ventricular size is normal. No increase in right ventricular wall thickness. Right ventricular systolic function is normal. There is normal pulmonary artery systolic pressure. Left Atrium: Left  atrial size was mildly dilated. Right Atrium: Right atrial size was normal in size. Pericardium: A small pericardial effusion is present. The pericardial effusion is posterior to the left ventricle. Mitral Valve: The mitral valve is degenerative in appearance. Moderate mitral valve regurgitation. No evidence of mitral valve stenosis. MV peak gradient, 12.0 mmHg. The mean mitral valve gradient is 3.0 mmHg. Tricuspid Valve: The tricuspid valve is normal in structure. Tricuspid valve regurgitation is mild . No evidence of tricuspid stenosis. Aortic Valve: The aortic valve is normal in structure. There is severe calcifcation of the aortic valve. There is severe thickening of the aortic valve. Aortic valve regurgitation is not visualized. Severe  aortic stenosis is present. Aortic valve mean gradient measures 69.0 mmHg. Aortic valve peak gradient measures 94.7 mmHg. Aortic valve area, by VTI measures 0.28 cm. Pulmonic Valve: The pulmonic valve was normal in structure. Pulmonic valve regurgitation is mild. No evidence of pulmonic stenosis. Aorta: The aortic root is normal in size and structure. Venous: The inferior vena cava is normal in size with greater than 50% respiratory variability, suggesting right atrial pressure of 3 mmHg. IAS/Shunts: No atrial level shunt detected by color flow Doppler. Additional Comments: There is a large pleural effusion in the left lateral region.  LEFT VENTRICLE PLAX 2D LVIDd:         3.90 cm LVIDs:         3.30 cm LV PW:         1.40 cm LV IVS:        1.70 cm LVOT diam:     2.00 cm LV SV:         30 LV SV Index:   20 LVOT Area:     3.14 cm  LV Volumes (MOD) LV vol d, MOD A2C: 90.9 ml LV vol d, MOD A4C: 77.3 ml LV vol s, MOD A2C: 50.4 ml LV vol s, MOD A4C: 42.5 ml LV SV MOD A2C:     40.5 ml LV SV MOD A4C:     77.3 ml LV SV MOD BP:      38.1 ml RIGHT VENTRICLE RV S prime:     10.20 cm/s TAPSE (M-mode): 1.9 cm LEFT ATRIUM             Index       RIGHT ATRIUM          Index LA diam:         3.50 cm 2.37 cm/m  RA Area:     7.10 cm LA Vol (A2C):   65.8 ml 44.49 ml/m RA Volume:   12.10 ml 8.18 ml/m LA Vol (A4C):   51.0 ml 34.48 ml/m LA Biplane Vol: 60.2 ml 40.70 ml/m  AORTIC VALVE AV Area (Vmax):    0.35 cm AV Area (Vmean):   0.33 cm AV Area (VTI):     0.28 cm AV Vmax:           486.67 cm/s AV Vmean:          367.667 cm/s AV VTI:            1.093 m AV Peak Grad:      94.7 mmHg AV Mean Grad:      69.0 mmHg LVOT Vmax:         54.55 cm/s LVOT Vmean:        38.575 cm/s LVOT VTI:          0.096 m LVOT/AV VTI ratio: 0.09  AORTA Ao Root diam: 2.90 cm Ao Asc diam:  3.30 cm MITRAL VALVE MV Peak grad: 12.0 mmHg      SHUNTS MV Mean grad: 3.0 mmHg       Systemic VTI:  0.10 m MV Vmax:      1.73 m/s       Systemic Diam: 2.00 cm MV Vmean:     69.1 cm/s MR Peak grad:    148.4 mmHg MR Mean grad:    83.0 mmHg MR Vmax:         609.00 cm/s MR Vmean:        427.0 cm/s MR PISA:         1.01 cm MR  PISA Eff ROA: 7 mm MR PISA Radius:  0.40 cm Ena Dawley MD Electronically signed by Ena Dawley MD Signature Date/Time: 09/01/2020/8:58:46 PM    Final      Medical Consultants:    None.  Anti-Infectives:   none  Subjective:    Julie Walter comfortably sleeping in bed.  Objective:    Vitals:   09/02/20 0400 09/02/20 0800 09/02/20 1117 09/02/20 1946  BP: (!) 107/59  (!) 107/59   Pulse: 69  78   Resp: (!) 23     Temp:  98.8 F (37.1 C)  (!) 100.8 F (38.2 C)  TempSrc:  Oral    SpO2: 91%     Weight: 55.6 kg     Height:       SpO2: 91 % O2 Flow Rate (L/min): 10 L/min FiO2 (%): 50 %   Intake/Output Summary (Last 24 hours) at Oct 02, 2020 1029 Last data filed at 2020/10/02 0400 Gross per 24 hour  Intake 11.58 ml  Output 0 ml  Net 11.58 ml   Filed Weights   08/23/2020 1800 09/02/2020 2030 09/02/20 0400  Weight: 52.6 kg 55.6 kg 55.6 kg    Exam: General exam: In no acute distress, agonal breathing Respiratory system: Good air movement and clear to  auscultation. Cardiovascular system: S1 & S2 heard, RRR. No JVD. Gastrointestinal system: Abdomen is nondistended, soft and nontender.  Extremities: No pedal edema. Skin: No rashes, lesions or ulcers   Data Reviewed:    Labs: Basic Metabolic Panel: Recent Labs  Lab 09/08/2020 1206 09/08/2020 1206 09/01/2020 1557 09/05/2020 1557 09/01/20 0307 09/02/20 0558  NA 141  --  140  --  142 144  K 4.0   < > 4.6   < > 4.6 5.2*  CL 103  --   --   --  106 106  CO2 21*  --   --   --  22 25  GLUCOSE 307*  --   --   --  160* 175*  BUN 18  --   --   --  26* 46*  CREATININE 1.00  --   --   --  0.90 1.13*  CALCIUM 9.4  --   --   --  9.1 9.4   < > = values in this interval not displayed.   GFR Estimated Creatinine Clearance: 20 mL/min (A) (by C-G formula based on SCr of 1.13 mg/dL (H)). Liver Function Tests: Recent Labs  Lab 09/08/2020 1206  AST 31  ALT 13  ALKPHOS 49  BILITOT 1.0  PROT 7.1  ALBUMIN 3.9   No results for input(s): LIPASE, AMYLASE in the last 168 hours. No results for input(s): AMMONIA in the last 168 hours. Coagulation profile No results for input(s): INR, PROTIME in the last 168 hours. COVID-19 Labs  No results for input(s): DDIMER, FERRITIN, LDH, CRP in the last 72 hours.  Lab Results  Component Value Date   [place] NEGATIVE 08/21/2020    CBC: Recent Labs  Lab 08/14/2020 1206 09/02/2020 1557  WBC 15.5*  --   NEUTROABS 9.6*  --   HGB 16.0* 16.0*  HCT 50.8* 47.0*  MCV 106.7*  --   PLT 186  --    Cardiac Enzymes: No results for input(s): CKTOTAL, CKMB, CKMBINDEX, TROPONINI in the last 168 hours. BNP (last 3 results) No results for input(s): PROBNP in the last 8760 hours. CBG: No results for input(s): GLUCAP in the last 168 hours. D-Dimer: No results for input(s): DDIMER in the last  72 hours. Hgb A1c: No results for input(s): HGBA1C in the last 72 hours. Lipid Profile: No results for input(s): CHOL, HDL, LDLCALC, TRIG, CHOLHDL, LDLDIRECT in the last 72  hours. Thyroid function studies: No results for input(s): TSH, T4TOTAL, T3FREE, THYROIDAB in the last 72 hours.  Invalid input(s): FREET3 Anemia work up: No results for input(s): VITAMINB12, FOLATE, FERRITIN, TIBC, IRON, RETICCTPCT in the last 72 hours. Sepsis Labs: Recent Labs  Lab 09/10/2020 1206 08/28/2020 2042  PROCALCITON  --  1.07  WBC 15.5*  --   LATICACIDVEN  --  2.2*   Microbiology Recent Results (from the past 240 hour(s))  Culture, blood (Routine X 2) w Reflex to ID Panel     Status: None (Preliminary result)   Collection Time: 08/12/2020 12:00 PM   Specimen: BLOOD  Result Value Ref Range Status   Specimen Description BLOOD RIGHT ANTECUBITAL  Final   Special Requests   Final    BOTTLES DRAWN AEROBIC AND ANAEROBIC Blood Culture results may not be optimal due to an inadequate volume of blood received in culture bottles   Culture   Final    NO GROWTH 3 DAYS Performed at [facility] [address] [place], [place] [address]    Report Status PENDING  Incomplete  Culture, blood (Routine X 2) w Reflex to ID Panel     Status: None (Preliminary result)   Collection Time: 08/28/2020 12:05 PM   Specimen: BLOOD  Result Value Ref Range Status   Specimen Description BLOOD RIGHT ANTECUBITAL  Final   Special Requests   Final    BOTTLES DRAWN AEROBIC AND ANAEROBIC Blood Culture results may not be optimal due to an inadequate volume of blood received in culture bottles   Culture   Final    NO GROWTH 3 DAYS Performed at [facility], [place] [address], [place], [place] [address]    Report Status PENDING  Incomplete  Resp Panel by RT PCR (RSV, Flu A&B, Covid) - Nasopharyngeal Swab     Status: None   Collection Time: 08/16/2020 12:09 PM   Specimen: Nasopharyngeal Swab  Result Value Ref Range Status   SARS Coronavirus 2 by RT PCR NEGATIVE NEGATIVE Final    Comment: (NOTE) SARS-CoV-2 target nucleic acids are NOT DETECTED.  The SARS-CoV-2 RNA is generally detectable in upper  respiratoy specimens during the acute phase of infection. The lowest concentration of SARS-CoV-2 viral copies this assay can detect is 131 copies/mL. A negative result does not preclude SARS-Cov-2 infection and should not be used as the sole basis for treatment or other patient management decisions. A negative result may occur with  improper specimen collection/handling, submission of specimen other than nasopharyngeal swab, presence of viral mutation(s) within the areas targeted by this assay, and inadequate number of viral copies (<131 copies/mL). A negative result must be combined with clinical observations, patient history, and epidemiological information. The expected result is Negative.  Fact Sheet for Patients:  PinkCheek.be  Fact Sheet for Healthcare Providers:  GravelBags.it  This test is no t yet approved or cleared by the [place] FDA and  has been authorized for detection and/or diagnosis of SARS-CoV-2 by FDA under an Emergency Use Authorization (EUA). This EUA will remain  in effect (meaning this test can be used) for the duration of the COVID-19 declaration under Section 564(b)(1) of the Act, 21 U.S.C. section 360bbb-3(b)(1), unless the authorization is terminated or revoked sooner.     Influenza A by PCR NEGATIVE NEGATIVE  Final   Influenza B by PCR NEGATIVE NEGATIVE Final    Comment: (NOTE) The Xpert Xpress SARS-CoV-2/FLU/RSV assay is intended as an aid in  the diagnosis of influenza from Nasopharyngeal swab specimens and  should not be used as a sole basis for treatment. Nasal washings and  aspirates are unacceptable for Xpert Xpress SARS-CoV-2/FLU/RSV  testing.  Fact Sheet for Patients: PinkCheek.be  Fact Sheet for Healthcare Providers: GravelBags.it  This test is not yet approved or cleared by the [place] FDA and  has been  authorized for detection and/or diagnosis of SARS-CoV-2 by  FDA under an Emergency Use Authorization (EUA). This EUA will remain  in effect (meaning this test can be used) for the duration of the  Covid-19 declaration under Section 564(b)(1) of the Act, 21  U.S.C. section 360bbb-3(b)(1), unless the authorization is  terminated or revoked.    Respiratory Syncytial Virus by PCR NEGATIVE NEGATIVE Final    Comment: (NOTE) Fact Sheet for Patients: PinkCheek.be  Fact Sheet for Healthcare Providers: GravelBags.it  This test is not yet approved or cleared by the [place] FDA and  has been authorized for detection and/or diagnosis of SARS-CoV-2 by  FDA under an Emergency Use Authorization (EUA). This EUA will remain  in effect (meaning this test can be used) for the duration of the  COVID-19 declaration under Section 564(b)(1) of the Act, 21 U.S.C.  section 360bbb-3(b)(1), unless the authorization is terminated or  revoked. Performed at [facility], [place] [address] [place], [place] [address]   MRSA PCR Screening     Status: None   Collection Time: 09/01/20  5:25 AM   Specimen: Nasopharyngeal  Result Value Ref Range Status   MRSA by PCR NEGATIVE NEGATIVE Final    Comment:        The GeneXpert MRSA Assay (FDA approved for NASAL specimens only), is one component of a comprehensive MRSA colonization surveillance program. It is not intended to diagnose MRSA infection nor to guide or monitor treatment for MRSA infections. Performed at [facility], [place] [address], [place], [place] [address]      Medications:    furosemide  80 mg Intravenous Once   LORazepam  0.5 mg Intravenous BID AC & HS   sodium chloride flush  3 mL Intravenous Q12H   Continuous Infusions:  sodium chloride     HYDROmorphone 0.5 mg/hr (2020-09-08 0644)      LOS: 3 days   Charlynne Cousins  [facility]  09-08-2020,  10:29 AM

## 2020-09-12 NOTE — Discharge Summary (Addendum)
Death Summary  Julie Walter JHE:[id number] DOB: [birth year] DOA: 09-11-2020  PCP: Lauree Chandler, NP  Admit date: 09-11-20 Date of Death: 2020-09-14 Time of Death: 1 PM Notification: Lauree Chandler, NP notified of death of 09/14/20   History of present illness:  Julie Walter is a 86 y.o. female with a history of severe aortic stenosis which she has refused TAVR's in the past, essential hypertension presents to the ED with hypoxia and dizziness, and here in the hospital she was placed on IV Lasix cardiology was consulted and agree with plan she became hyperkalemic with new acute renal failure and she decided to move towards comfort care. She was placed on IV morphine and died on September 14, 2020 her family was at bedside and comfortable with decisions.  Final Diagnoses:  Acute respiratory failure with hypoxia secondary to pulmonary edema in the setting of severe aortic stenosis Chronic diastolic heart failure New left bundle branch block Mild leukocytosis Essential hypertension Diabetes mellitus type 2 without long-term use of insulin    The results of significant diagnostics from this hospitalization (including imaging, microbiology, ancillary and laboratory) are listed below for reference.    Significant Diagnostic Studies: DG Chest 1 View  Result Date: 09/01/2020 CLINICAL DATA:  CHF. EXAM: CHEST  1 VIEW COMPARISON:  CT Sep 11, 2020.  Chest x-ray 09/11/2020. FINDINGS: Mediastinum stable. Heart size stable. Diffuse bilateral interstitial prominence and bibasilar atelectasis/infiltrates again noted. Tiny bilateral pleural effusions can not be excluded. No pneumothorax. Stable deformity right proximal humerus. Degenerative change both shoulders and thoracic spine. Surgical clips right upper quadrant. IMPRESSION: Diffuse bilateral interstitial prominence again noted. Interstitial edema and/or pneumonitis could present this fashion. Bibasilar atelectasis/infiltrates again  noted. Tiny bilateral pleural effusions can not be excluded. Electronically Signed   By: Marcello Moores  Register   On: 09/01/2020 05:57   CT Angio Chest PE W/Cm &/Or Wo Cm  Result Date: September 11, 2020 CLINICAL DATA:  Shortness of breath EXAM: CT ANGIOGRAPHY CHEST WITH CONTRAST TECHNIQUE: Multidetector CT imaging of the chest was performed using the standard protocol during bolus administration of intravenous contrast. Multiplanar CT image reconstructions and MIPs were obtained to evaluate the vascular anatomy. CONTRAST:  51mL OMNIPAQUE IOHEXOL 350 MG/ML SOLN COMPARISON:  None. FINDINGS: Cardiovascular: Satisfactory opacification of the pulmonary arteries to the segmental level. No evidence of pulmonary embolism. Normal heart size. No pericardial effusion. Coronary artery calcification. Thoracic aorta is normal in caliber with atherosclerotic calcification. Mediastinum/Nodes: No enlarged lymph nodes. Thyroid is unremarkable. Lungs/Pleura: Patchy bilateral ground-glass density and consolidation with basilar predominance. Small, right greater than left pleural effusions. No pneumothorax. Upper Abdomen: No acute abnormality Musculoskeletal: Multilevel degenerative changes of the included spine. No acute abnormality. Review of the MIP images confirms the above findings. IMPRESSION: No acute pulmonary embolism. Bilateral pulmonary opacities. Small bilateral pleural effusions. May reflect pulmonary edema or atypical/viral pneumonia. Electronically Signed   By: Macy Mis M.D.   On: Sep 11, 2020 14:58   DG Chest Port 1 View  Result Date: 09-11-20 CLINICAL DATA:  Shortness of breath EXAM: PORTABLE CHEST 1 VIEW COMPARISON:  None. FINDINGS: Heart size is upper limits of normal. Atherosclerotic calcification of the aortic knob. Dense right basilar consolidation. Small right pleural effusion. Prominent interstitial markings throughout both lungs. No pneumothorax. Advanced degenerative changes of the bilateral glenohumeral  joints. IMPRESSION: Dense right basilar consolidation with small right pleural effusion. Findings may represent a combination of atelectasis and pneumonia. Radiographic follow-up to resolution is recommended. Electronically Signed   By: Davina Poke D.O.   On: 09-11-2020  12:35   ECHOCARDIOGRAM COMPLETE  Result Date: 09/01/2020    ECHOCARDIOGRAM REPORT   Patient Name:   Julie Walter Date of Exam: 09/01/2020 Medical Rec #:  [id number]         Height:       58.0 in Accession #:    [id number]        Weight:       122.6 lb Date of Birth:  09-20-1919         BSA:          1.479 m Patient Age:    75 years          BP:           115/75 mmHg Patient Gender: F                 HR:           86 bpm. Exam Location:  Inpatient Procedure: 2D Echo, Color Doppler and Cardiac Doppler Indications:    Aortic Stenosis i25.0  History:        Patient has no prior history of Echocardiogram examinations.                 CHF; Risk Factors:Hypertension, Diabetes and Dyslipidemia.  Sonographer:    Raquel Sarna Senior RDCS Referring Phys: [id number] [place]  1. Critical aortic stenosis with peak/mean transaortic gradient 104/69 mmHg, AVA 0.27 cm2 and dimensionless index 0.11.  2. Left ventricular ejection fraction, by estimation, is 30 to 35%. The left ventricle has moderately decreased function. The left ventricle demonstrates global hypokinesis. Left ventricular diastolic parameters are consistent with Grade II diastolic dysfunction (pseudonormalization). Elevated left atrial pressure.  3. Right ventricular systolic function is normal. The right ventricular size is normal. There is normal pulmonary artery systolic pressure.  4. Left atrial size was mildly dilated.  5. A small pericardial effusion is present. The pericardial effusion is posterior to the left ventricle. Large pleural effusion in the left lateral region.  6. The mitral valve is degenerative. Moderate mitral valve regurgitation. No evidence of mitral stenosis.   7. The aortic valve is normal in structure. There is severe calcifcation of the aortic valve. There is severe thickening of the aortic valve. Aortic valve regurgitation is not visualized. Severe aortic valve stenosis. Aortic valve mean gradient measures  69.0 mmHg.  8. The inferior vena cava is normal in size with greater than 50% respiratory variability, suggesting right atrial pressure of 3 mmHg. FINDINGS  Left Ventricle: Left ventricular ejection fraction, by estimation, is 30 to 35%. The left ventricle has moderately decreased function. The left ventricle demonstrates global hypokinesis. The left ventricular internal cavity size was normal in size. There is no left ventricular hypertrophy. Left ventricular diastolic parameters are consistent with Grade II diastolic dysfunction (pseudonormalization). Elevated left atrial pressure. Right Ventricle: The right ventricular size is normal. No increase in right ventricular wall thickness. Right ventricular systolic function is normal. There is normal pulmonary artery systolic pressure. Left Atrium: Left atrial size was mildly dilated. Right Atrium: Right atrial size was normal in size. Pericardium: A small pericardial effusion is present. The pericardial effusion is posterior to the left ventricle. Mitral Valve: The mitral valve is degenerative in appearance. Moderate mitral valve regurgitation. No evidence of mitral valve stenosis. MV peak gradient, 12.0 mmHg. The mean mitral valve gradient is 3.0 mmHg. Tricuspid Valve: The tricuspid valve is normal in structure. Tricuspid valve regurgitation is mild . No evidence of tricuspid stenosis. Aortic Valve: The  aortic valve is normal in structure. There is severe calcifcation of the aortic valve. There is severe thickening of the aortic valve. Aortic valve regurgitation is not visualized. Severe aortic stenosis is present. Aortic valve mean gradient measures 69.0 mmHg. Aortic valve peak gradient measures 94.7 mmHg. Aortic  valve area, by VTI measures 0.28 cm. Pulmonic Valve: The pulmonic valve was normal in structure. Pulmonic valve regurgitation is mild. No evidence of pulmonic stenosis. Aorta: The aortic root is normal in size and structure. Venous: The inferior vena cava is normal in size with greater than 50% respiratory variability, suggesting right atrial pressure of 3 mmHg. IAS/Shunts: No atrial level shunt detected by color flow Doppler. Additional Comments: There is a large pleural effusion in the left lateral region.  LEFT VENTRICLE PLAX 2D LVIDd:         3.90 cm LVIDs:         3.30 cm LV PW:         1.40 cm LV IVS:        1.70 cm LVOT diam:     2.00 cm LV SV:         30 LV SV Index:   20 LVOT Area:     3.14 cm  LV Volumes (MOD) LV vol d, MOD A2C: 90.9 ml LV vol d, MOD A4C: 77.3 ml LV vol s, MOD A2C: 50.4 ml LV vol s, MOD A4C: 42.5 ml LV SV MOD A2C:     40.5 ml LV SV MOD A4C:     77.3 ml LV SV MOD BP:      38.1 ml RIGHT VENTRICLE RV S prime:     10.20 cm/s TAPSE (M-mode): 1.9 cm LEFT ATRIUM             Index       RIGHT ATRIUM          Index LA diam:        3.50 cm 2.37 cm/m  RA Area:     7.10 cm LA Vol (A2C):   65.8 ml 44.49 ml/m RA Volume:   12.10 ml 8.18 ml/m LA Vol (A4C):   51.0 ml 34.48 ml/m LA Biplane Vol: 60.2 ml 40.70 ml/m  AORTIC VALVE AV Area (Vmax):    0.35 cm AV Area (Vmean):   0.33 cm AV Area (VTI):     0.28 cm AV Vmax:           486.67 cm/s AV Vmean:          367.667 cm/s AV VTI:            1.093 m AV Peak Grad:      94.7 mmHg AV Mean Grad:      69.0 mmHg LVOT Vmax:         54.55 cm/s LVOT Vmean:        38.575 cm/s LVOT VTI:          0.096 m LVOT/AV VTI ratio: 0.09  AORTA Ao Root diam: 2.90 cm Ao Asc diam:  3.30 cm MITRAL VALVE MV Peak grad: 12.0 mmHg      SHUNTS MV Mean grad: 3.0 mmHg       Systemic VTI:  0.10 m MV Vmax:      1.73 m/s       Systemic Diam: 2.00 cm MV Vmean:     69.1 cm/s MR Peak grad:    148.4 mmHg MR Mean grad:    83.0 mmHg MR Vmax:  609.00 cm/s MR Vmean:        427.0 cm/s MR  PISA:         1.01 cm MR PISA Eff ROA: 7 mm MR PISA Radius:  0.40 cm Ena Dawley MD Electronically signed by Ena Dawley MD Signature Date/Time: 09/01/2020/8:58:46 PM    Final     Microbiology: Recent Results (from the past 240 hour(s))  Culture, blood (Routine X 2) w Reflex to ID Panel     Status: None (Preliminary result)   Collection Time: 08/23/2020 12:00 PM   Specimen: BLOOD  Result Value Ref Range Status   Specimen Description BLOOD RIGHT ANTECUBITAL  Final   Special Requests   Final    BOTTLES DRAWN AEROBIC AND ANAEROBIC Blood Culture results may not be optimal due to an inadequate volume of blood received in culture bottles   Culture   Final    NO GROWTH 3 DAYS Performed at [facility], [place] [address], [place], [place] [address]    Report Status PENDING  Incomplete  Culture, blood (Routine X 2) w Reflex to ID Panel     Status: None (Preliminary result)   Collection Time: 08/28/2020 12:05 PM   Specimen: BLOOD  Result Value Ref Range Status   Specimen Description BLOOD RIGHT ANTECUBITAL  Final   Special Requests   Final    BOTTLES DRAWN AEROBIC AND ANAEROBIC Blood Culture results may not be optimal due to an inadequate volume of blood received in culture bottles   Culture   Final    NO GROWTH 3 DAYS Performed at [facility], [place] [address], [place], [place] [address]    Report Status PENDING  Incomplete  Resp Panel by RT PCR (RSV, Flu A&B, Covid) - Nasopharyngeal Swab     Status: None   Collection Time: 08/17/2020 12:09 PM   Specimen: Nasopharyngeal Swab  Result Value Ref Range Status   SARS Coronavirus 2 by RT PCR NEGATIVE NEGATIVE Final    Comment: (NOTE) SARS-CoV-2 target nucleic acids are NOT DETECTED.  The SARS-CoV-2 RNA is generally detectable in upper respiratoy specimens during the acute phase of infection. The lowest concentration of SARS-CoV-2 viral copies this assay can detect is 131 copies/mL. A negative result does not preclude  SARS-Cov-2 infection and should not be used as the sole basis for treatment or other patient management decisions. A negative result may occur with  improper specimen collection/handling, submission of specimen other than nasopharyngeal swab, presence of viral mutation(s) within the areas targeted by this assay, and inadequate number of viral copies (<131 copies/mL). A negative result must be combined with clinical observations, patient history, and epidemiological information. The expected result is Negative.  Fact Sheet for Patients:  PinkCheek.be  Fact Sheet for Healthcare Providers:  GravelBags.it  This test is no t yet approved or cleared by the [place] FDA and  has been authorized for detection and/or diagnosis of SARS-CoV-2 by FDA under an Emergency Use Authorization (EUA). This EUA will remain  in effect (meaning this test can be used) for the duration of the COVID-19 declaration under Section 564(b)(1) of the Act, 21 U.S.C. section 360bbb-3(b)(1), unless the authorization is terminated or revoked sooner.     Influenza A by PCR NEGATIVE NEGATIVE Final   Influenza B by PCR NEGATIVE NEGATIVE Final    Comment: (NOTE) The Xpert Xpress SARS-CoV-2/FLU/RSV assay is intended as an aid in  the diagnosis of influenza from Nasopharyngeal swab specimens and  should not be used  as a sole basis for treatment. Nasal washings and  aspirates are unacceptable for Xpert Xpress SARS-CoV-2/FLU/RSV  testing.  Fact Sheet for Patients: PinkCheek.be  Fact Sheet for Healthcare Providers: GravelBags.it  This test is not yet approved or cleared by the [place] FDA and  has been authorized for detection and/or diagnosis of SARS-CoV-2 by  FDA under an Emergency Use Authorization (EUA). This EUA will remain  in effect (meaning this test can be used) for the duration of the   Covid-19 declaration under Section 564(b)(1) of the Act, 21  U.S.C. section 360bbb-3(b)(1), unless the authorization is  terminated or revoked.    Respiratory Syncytial Virus by PCR NEGATIVE NEGATIVE Final    Comment: (NOTE) Fact Sheet for Patients: PinkCheek.be  Fact Sheet for Healthcare Providers: GravelBags.it  This test is not yet approved or cleared by the [place] FDA and  has been authorized for detection and/or diagnosis of SARS-CoV-2 by  FDA under an Emergency Use Authorization (EUA). This EUA will remain  in effect (meaning this test can be used) for the duration of the  COVID-19 declaration under Section 564(b)(1) of the Act, 21 U.S.C.  section 360bbb-3(b)(1), unless the authorization is terminated or  revoked. Performed at [facility], [place] [address], [place], [place] [address]   MRSA PCR Screening     Status: None   Collection Time: 09/01/20  5:25 AM   Specimen: Nasopharyngeal  Result Value Ref Range Status   MRSA by PCR NEGATIVE NEGATIVE Final    Comment:        The GeneXpert MRSA Assay (FDA approved for NASAL specimens only), is one component of a comprehensive MRSA colonization surveillance program. It is not intended to diagnose MRSA infection nor to guide or monitor treatment for MRSA infections. Performed at [facility], [place] [address] [place], [place] [address]      Labs: Basic Metabolic Panel: Recent Labs  Lab 09/10/2020 1206 09/05/2020 1206 08/18/2020 1557 08/18/2020 1557 09/01/20 0307 09/02/20 0558  NA 141  --  140  --  142 144  K 4.0   < > 4.6   < > 4.6 5.2*  CL 103  --   --   --  106 106  CO2 21*  --   --   --  22 25  GLUCOSE 307*  --   --   --  160* 175*  BUN 18  --   --   --  26* 46*  CREATININE 1.00  --   --   --  0.90 1.13*  CALCIUM 9.4  --   --   --  9.1 9.4   < > = values in this interval not displayed.   Liver Function Tests: Recent Labs  Lab  09/04/2020 1206  AST 31  ALT 13  ALKPHOS 49  BILITOT 1.0  PROT 7.1  ALBUMIN 3.9   No results for input(s): LIPASE, AMYLASE in the last 168 hours. No results for input(s): AMMONIA in the last 168 hours. CBC: Recent Labs  Lab 09/01/2020 1206 08/13/2020 1557  WBC 15.5*  --   NEUTROABS 9.6*  --   HGB 16.0* 16.0*  HCT 50.8* 47.0*  MCV 106.7*  --   PLT 186  --    Cardiac Enzymes: No results for input(s): CKTOTAL, CKMB, CKMBINDEX, TROPONINI in the last 168 hours. D-Dimer No results for input(s): DDIMER in the last 72 hours. BNP: Invalid input(s): POCBNP CBG: No results for input(s): GLUCAP in the  last 168 hours. Anemia work up No results for input(s): VITAMINB12, FOLATE, FERRITIN, TIBC, IRON, RETICCTPCT in the last 72 hours. Urinalysis    Component Value Date/Time   COLORURINE YELLOW 03/28/2018 0559   APPEARANCEUR HAZY (A) 03/28/2018 0559   LABSPEC 1.015 03/28/2018 0559   PHURINE 5.0 03/28/2018 0559   GLUCOSEU NEGATIVE 03/28/2018 0559   HGBUR NEGATIVE 03/28/2018 0559   BILIRUBINUR neg 10/29/2018 1606   KETONESUR NEGATIVE 03/28/2018 0559   PROTEINUR Negative 10/29/2018 1606   PROTEINUR NEGATIVE 03/28/2018 0559   UROBILINOGEN 0.2 10/29/2018 1606   NITRITE neg 10/29/2018 1606   NITRITE NEGATIVE 03/28/2018 0559   LEUKOCYTESUR Negative 10/29/2018 1606   Sepsis Labs Invalid input(s): PROCALCITONIN,  WBC,  LACTICIDVEN     SIGNED:  Charlynne Cousins, MD  [facility] 21-Sep-2020, 2:17 PM Pager   If 7PM-7AM, please contact night-coverage [email or web address] Password [email or web address]

## 2020-09-12 NOTE — Progress Notes (Signed)
Ms Anne Ng patient daughter called to come and pick up a watch left at the nurses station.

## 2020-09-12 DEATH — deceased

## 2021-01-09 ENCOUNTER — Other Ambulatory Visit: Payer: Medicare HMO

## 2021-01-09 ENCOUNTER — Ambulatory Visit: Payer: Medicare HMO | Admitting: Nurse Practitioner
